# Patient Record
Sex: Female | Born: 1935
Health system: Southern US, Community
[De-identification: ages and names within clinical notes are randomized; demographics above are authoritative.]

## PROBLEM LIST (undated history)

## (undated) DIAGNOSIS — E785 Hyperlipidemia, unspecified: Secondary | ICD-10-CM

## (undated) DIAGNOSIS — M1990 Unspecified osteoarthritis, unspecified site: Secondary | ICD-10-CM

## (undated) DIAGNOSIS — Z9221 Personal history of antineoplastic chemotherapy: Secondary | ICD-10-CM

## (undated) DIAGNOSIS — C50412 Malignant neoplasm of upper-outer quadrant of left female breast: Secondary | ICD-10-CM

## (undated) DIAGNOSIS — Z5189 Encounter for other specified aftercare: Secondary | ICD-10-CM

## (undated) DIAGNOSIS — C50919 Malignant neoplasm of unspecified site of unspecified female breast: Secondary | ICD-10-CM

## (undated) DIAGNOSIS — F419 Anxiety disorder, unspecified: Secondary | ICD-10-CM

## (undated) DIAGNOSIS — I10 Essential (primary) hypertension: Secondary | ICD-10-CM

## (undated) DIAGNOSIS — G709 Myoneural disorder, unspecified: Secondary | ICD-10-CM

## (undated) DIAGNOSIS — H269 Unspecified cataract: Secondary | ICD-10-CM

## (undated) DIAGNOSIS — T8859XA Other complications of anesthesia, initial encounter: Secondary | ICD-10-CM

## (undated) DIAGNOSIS — K219 Gastro-esophageal reflux disease without esophagitis: Secondary | ICD-10-CM

## (undated) DIAGNOSIS — D649 Anemia, unspecified: Secondary | ICD-10-CM

## (undated) DIAGNOSIS — T4145XA Adverse effect of unspecified anesthetic, initial encounter: Secondary | ICD-10-CM

## (undated) HISTORY — DX: Unspecified cataract: H26.9

## (undated) HISTORY — DX: Malignant neoplasm of upper-outer quadrant of left female breast: C50.412

## (undated) HISTORY — DX: Gastro-esophageal reflux disease without esophagitis: K21.9

## (undated) HISTORY — DX: Hyperlipidemia, unspecified: E78.5

## (undated) HISTORY — DX: Anemia, unspecified: D64.9

## (undated) HISTORY — DX: Anxiety disorder, unspecified: F41.9

## (undated) HISTORY — DX: Myoneural disorder, unspecified: G70.9

## (undated) HISTORY — DX: Encounter for other specified aftercare: Z51.89

## (undated) HISTORY — DX: Unspecified osteoarthritis, unspecified site: M19.90

## (undated) HISTORY — DX: Essential (primary) hypertension: I10

---
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
## 1987-03-22 HISTORY — PX: DILATION AND CURETTAGE OF UTERUS: SHX78

## 2000-03-21 HISTORY — PX: COLONOSCOPY: SHX174

## 2001-03-07 ENCOUNTER — Ambulatory Visit (HOSPITAL_COMMUNITY): Admission: RE | Admit: 2001-03-07 | Discharge: 2001-03-07 | Payer: Self-pay | Admitting: Gastroenterology

## 2001-03-07 ENCOUNTER — Encounter (INDEPENDENT_AMBULATORY_CARE_PROVIDER_SITE_OTHER): Payer: Self-pay | Admitting: *Deleted

## 2004-05-31 ENCOUNTER — Ambulatory Visit: Payer: Self-pay | Admitting: Gastroenterology

## 2004-06-14 ENCOUNTER — Ambulatory Visit: Payer: Self-pay | Admitting: Gastroenterology

## 2004-07-09 ENCOUNTER — Ambulatory Visit: Payer: Self-pay | Admitting: Family Medicine

## 2005-03-05 ENCOUNTER — Emergency Department: Payer: Self-pay | Admitting: Emergency Medicine

## 2005-04-14 ENCOUNTER — Ambulatory Visit: Payer: Self-pay | Admitting: Gastroenterology

## 2005-04-27 ENCOUNTER — Encounter (INDEPENDENT_AMBULATORY_CARE_PROVIDER_SITE_OTHER): Payer: Self-pay | Admitting: *Deleted

## 2005-04-27 ENCOUNTER — Ambulatory Visit: Payer: Self-pay | Admitting: Gastroenterology

## 2005-04-27 LAB — HM COLONOSCOPY

## 2005-05-12 ENCOUNTER — Ambulatory Visit (HOSPITAL_COMMUNITY): Admission: RE | Admit: 2005-05-12 | Discharge: 2005-05-12 | Payer: Self-pay | Admitting: Gastroenterology

## 2005-05-12 ENCOUNTER — Ambulatory Visit: Payer: Self-pay | Admitting: Internal Medicine

## 2005-07-12 ENCOUNTER — Ambulatory Visit: Payer: Self-pay | Admitting: Gastroenterology

## 2005-08-29 ENCOUNTER — Ambulatory Visit: Payer: Self-pay | Admitting: Gastroenterology

## 2005-09-01 ENCOUNTER — Ambulatory Visit: Payer: Self-pay | Admitting: Family Medicine

## 2005-09-01 ENCOUNTER — Ambulatory Visit: Payer: Self-pay | Admitting: Gastroenterology

## 2006-03-21 HISTORY — PX: NECK SURGERY: SHX720

## 2006-06-06 ENCOUNTER — Ambulatory Visit: Payer: Self-pay | Admitting: Unknown Physician Specialty

## 2006-11-02 ENCOUNTER — Ambulatory Visit: Payer: Self-pay | Admitting: Family Medicine

## 2007-09-18 ENCOUNTER — Encounter (INDEPENDENT_AMBULATORY_CARE_PROVIDER_SITE_OTHER): Payer: Self-pay | Admitting: Family Medicine

## 2007-09-18 ENCOUNTER — Ambulatory Visit: Payer: Self-pay | Admitting: Sports Medicine

## 2007-09-18 DIAGNOSIS — I10 Essential (primary) hypertension: Secondary | ICD-10-CM | POA: Insufficient documentation

## 2007-09-18 DIAGNOSIS — E785 Hyperlipidemia, unspecified: Secondary | ICD-10-CM

## 2007-09-18 DIAGNOSIS — M722 Plantar fascial fibromatosis: Secondary | ICD-10-CM

## 2007-09-18 DIAGNOSIS — K279 Peptic ulcer, site unspecified, unspecified as acute or chronic, without hemorrhage or perforation: Secondary | ICD-10-CM | POA: Insufficient documentation

## 2007-10-12 ENCOUNTER — Ambulatory Visit: Payer: Self-pay | Admitting: Sports Medicine

## 2007-10-12 DIAGNOSIS — M25579 Pain in unspecified ankle and joints of unspecified foot: Secondary | ICD-10-CM

## 2008-03-12 ENCOUNTER — Ambulatory Visit: Payer: Self-pay | Admitting: Family Medicine

## 2008-05-02 ENCOUNTER — Ambulatory Visit: Payer: Self-pay | Admitting: Family Medicine

## 2008-11-04 ENCOUNTER — Ambulatory Visit: Payer: Self-pay | Admitting: Ophthalmology

## 2008-11-18 ENCOUNTER — Ambulatory Visit: Payer: Self-pay | Admitting: Ophthalmology

## 2009-01-14 ENCOUNTER — Ambulatory Visit: Payer: Self-pay | Admitting: Ophthalmology

## 2009-01-27 ENCOUNTER — Ambulatory Visit: Payer: Self-pay | Admitting: Ophthalmology

## 2009-03-21 HISTORY — PX: CATARACT EXTRACTION, BILATERAL: SHX1313

## 2009-05-19 ENCOUNTER — Ambulatory Visit: Payer: Self-pay | Admitting: Family Medicine

## 2009-10-21 ENCOUNTER — Ambulatory Visit: Payer: Self-pay | Admitting: Family Medicine

## 2010-03-21 HISTORY — PX: JOINT REPLACEMENT: SHX530

## 2010-08-06 NOTE — Op Note (Signed)
Sandra Fritz, Sandra Fritz NO.:  [id number]   MEDICAL RECORD NO.:  [id number]          PATIENT TYPE:  AMB   LOCATION:  ENDO                         FACILITY:  [facility]   PHYSICIAN:  Iva Boop, M.D. LHCDATE OF BIRTH:  Oct 17, 1934   DATE OF PROCEDURE:  05/12/2005  DATE OF DISCHARGE:  05/12/2005                                 OPERATIVE REPORT   PROCEDURE:  Small bowel capsule endoscopy.   REFERRED BY:  Vania Rea. Jarold Motto, M.D.   INDICATIONS FOR PROCEDURE:  A 76 year old woman with iron deficiency anemia  and unrevealing upper GI endoscopy and colonoscopy.   PROCEDURE DATA:  Height 68 inches, weight 168 pounds, normal build. The  gastric passage time 1 hour and 19 minutes, small bowel passage time 3 hours  20 minutes.   PROCEDURE:  Information and findings showed a completed study with the  capsule reaching the colon. There was one small erosion versus a prominent  vascular area in the proximal small bowel. This is a nonspecific finding.   SUMMARY AND RECOMMENDATIONS:  I do not see any cause of iron deficiency  here.   Further plans per Dr. Jarold Motto regarding any further workup and therapy.      Iva Boop, M.D. [facility]  Electronically Signed     CEG/MEDQ  D:  05/23/2005  T:  05/23/2005  Job:  [id number]   cc:   Vania Rea. Jarold Motto, M.D. [facility]  [address]  [place]  [place] [address]

## 2010-11-10 ENCOUNTER — Ambulatory Visit: Payer: Self-pay | Admitting: Neurology

## 2010-12-21 ENCOUNTER — Other Ambulatory Visit (HOSPITAL_COMMUNITY): Payer: Self-pay | Admitting: Neurosurgery

## 2010-12-21 ENCOUNTER — Encounter (HOSPITAL_COMMUNITY)
Admission: RE | Admit: 2010-12-21 | Discharge: 2010-12-21 | Disposition: A | Discharge status: Home / Self Care | Payer: Medicare Other | Source: Ambulatory Visit | Attending: Neurosurgery | Admitting: Neurosurgery

## 2010-12-21 DIAGNOSIS — R52 Pain, unspecified: Secondary | ICD-10-CM

## 2010-12-21 LAB — CBC
HCT: 36.1 % (ref 36.0–46.0)
Hemoglobin: 11.6 g/dL — ABNORMAL LOW (ref 12.0–15.0)
MCH: 26.4 pg (ref 26.0–34.0)
MCHC: 32.1 g/dL (ref 30.0–36.0)
MCV: 82 fL (ref 78.0–100.0)
Platelets: 435 10*3/uL — ABNORMAL HIGH (ref 150–400)
RBC: 4.4 MIL/uL (ref 3.87–5.11)
RDW: 15.6 % — ABNORMAL HIGH (ref 11.5–15.5)
WBC: 7.5 10*3/uL (ref 4.0–10.5)

## 2010-12-21 LAB — BASIC METABOLIC PANEL
BUN: 16 mg/dL (ref 6–23)
Calcium: 10.2 mg/dL (ref 8.4–10.5)
GFR calc Af Amer: 44 mL/min — ABNORMAL LOW (ref >90)
GFR calc non Af Amer: 38 mL/min — ABNORMAL LOW (ref >90)
Potassium: 3.5 mEq/L (ref 3.5–5.1)

## 2011-01-04 ENCOUNTER — Observation Stay (HOSPITAL_COMMUNITY): Payer: Medicare Other

## 2011-01-04 ENCOUNTER — Inpatient Hospital Stay (HOSPITAL_COMMUNITY)
Admission: RE | Admit: 2011-01-04 | Discharge: 2011-01-05 | DRG: 473 | Disposition: A | Discharge status: Home / Self Care | Payer: Medicare Other | Source: Ambulatory Visit | Attending: Neurosurgery | Admitting: Neurosurgery

## 2011-01-04 DIAGNOSIS — I10 Essential (primary) hypertension: Secondary | ICD-10-CM | POA: Diagnosis present

## 2011-01-04 DIAGNOSIS — Z01812 Encounter for preprocedural laboratory examination: Secondary | ICD-10-CM

## 2011-01-04 DIAGNOSIS — K219 Gastro-esophageal reflux disease without esophagitis: Secondary | ICD-10-CM | POA: Diagnosis present

## 2011-01-04 DIAGNOSIS — Z01818 Encounter for other preprocedural examination: Secondary | ICD-10-CM

## 2011-01-04 DIAGNOSIS — M4000 Postural kyphosis, site unspecified: Principal | ICD-10-CM | POA: Diagnosis present

## 2011-01-19 ENCOUNTER — Other Ambulatory Visit: Payer: Self-pay | Admitting: Neurosurgery

## 2011-01-19 ENCOUNTER — Ambulatory Visit
Admission: RE | Admit: 2011-01-19 | Discharge: 2011-01-19 | Disposition: A | Discharge status: Home / Self Care | Payer: Medicare Other | Source: Ambulatory Visit | Attending: Neurosurgery | Admitting: Neurosurgery

## 2011-01-19 DIAGNOSIS — M542 Cervicalgia: Secondary | ICD-10-CM

## 2011-01-19 DIAGNOSIS — M533 Sacrococcygeal disorders, not elsewhere classified: Secondary | ICD-10-CM

## 2011-06-14 ENCOUNTER — Ambulatory Visit: Payer: Self-pay | Admitting: Family Medicine

## 2011-09-28 ENCOUNTER — Ambulatory Visit: Payer: Self-pay | Admitting: Family Medicine

## 2011-12-22 ENCOUNTER — Ambulatory Visit: Payer: Self-pay | Admitting: Family Medicine

## 2012-08-16 ENCOUNTER — Ambulatory Visit: Payer: Self-pay | Admitting: Family Medicine

## 2012-09-28 ENCOUNTER — Ambulatory Visit: Payer: Self-pay | Admitting: Orthopedic Surgery

## 2012-12-25 ENCOUNTER — Ambulatory Visit: Payer: Self-pay | Admitting: Family Medicine

## 2013-06-25 LAB — LIPID PANEL
CHOLESTEROL: 216 mg/dL — AB (ref 0–200)
HDL: 64 mg/dL (ref 35–70)
LDL Cholesterol: 133 mg/dL
TRIGLYCERIDES: 96 mg/dL (ref 40–160)

## 2013-06-25 LAB — TSH: TSH: 0.95 u[IU]/mL (ref 0.41–5.90)

## 2013-06-29 ENCOUNTER — Ambulatory Visit: Payer: Self-pay | Admitting: Orthopedic Surgery

## 2013-08-19 ENCOUNTER — Ambulatory Visit: Payer: Self-pay | Admitting: Orthopedic Surgery

## 2013-10-02 ENCOUNTER — Ambulatory Visit: Payer: Self-pay | Admitting: Orthopedic Surgery

## 2013-10-02 LAB — URINALYSIS, COMPLETE
BLOOD: NEGATIVE
Bacteria: NONE SEEN
Bilirubin,UR: NEGATIVE
GLUCOSE, UR: NEGATIVE mg/dL (ref 0–75)
KETONE: NEGATIVE
Leukocyte Esterase: NEGATIVE
Nitrite: NEGATIVE
Ph: 6 (ref 4.5–8.0)
Protein: NEGATIVE
Specific Gravity: 1.016 (ref 1.003–1.030)
Squamous Epithelial: 3

## 2013-10-02 LAB — PROTIME-INR
INR: 1
Prothrombin Time: 12.9 secs (ref 11.5–14.7)

## 2013-10-02 LAB — BASIC METABOLIC PANEL
ANION GAP: 7 (ref 7–16)
BUN: 22 mg/dL — AB (ref 7–18)
CO2: 30 mmol/L (ref 21–32)
Calcium, Total: 9.8 mg/dL (ref 8.5–10.1)
Chloride: 98 mmol/L (ref 98–107)
Creatinine: 1.05 mg/dL (ref 0.60–1.30)
EGFR (Non-African Amer.): 52 — ABNORMAL LOW
GFR CALC AF AMER: 60 — AB
GLUCOSE: 126 mg/dL — AB (ref 65–99)
OSMOLALITY: 275 (ref 275–301)
Potassium: 3.3 mmol/L — ABNORMAL LOW (ref 3.5–5.1)
Sodium: 135 mmol/L — ABNORMAL LOW (ref 136–145)

## 2013-10-02 LAB — CBC
HCT: 37.1 % (ref 35.0–47.0)
HGB: 12.3 g/dL (ref 12.0–16.0)
MCH: 28.5 pg (ref 26.0–34.0)
MCHC: 33.3 g/dL (ref 32.0–36.0)
MCV: 86 fL (ref 80–100)
PLATELETS: 461 10*3/uL — AB (ref 150–440)
RBC: 4.33 10*6/uL (ref 3.80–5.20)
RDW: 13.9 % (ref 11.5–14.5)
WBC: 8.7 10*3/uL (ref 3.6–11.0)

## 2013-10-02 LAB — APTT: Activated PTT: 26.6 secs (ref 23.6–35.9)

## 2013-10-03 LAB — MRSA PCR SCREENING

## 2013-10-09 ENCOUNTER — Inpatient Hospital Stay: Payer: Self-pay | Admitting: Specialist

## 2013-10-09 LAB — CBC WITH DIFFERENTIAL/PLATELET
BASOS PCT: 0.6 %
Basophil #: 0.1 10*3/uL (ref 0.0–0.1)
EOS ABS: 0.1 10*3/uL (ref 0.0–0.7)
EOS PCT: 0.9 %
HCT: 30.7 % — AB (ref 35.0–47.0)
HGB: 9.8 g/dL — ABNORMAL LOW (ref 12.0–16.0)
LYMPHS ABS: 2.9 10*3/uL (ref 1.0–3.6)
LYMPHS PCT: 24.9 %
MCH: 28 pg (ref 26.0–34.0)
MCHC: 32.1 g/dL (ref 32.0–36.0)
MCV: 88 fL (ref 80–100)
MONO ABS: 0.8 x10 3/mm (ref 0.2–0.9)
Monocyte %: 6.3 %
NEUTROS PCT: 67.3 %
Neutrophil #: 8 10*3/uL — ABNORMAL HIGH (ref 1.4–6.5)
Platelet: 386 10*3/uL (ref 150–440)
RBC: 3.51 10*6/uL — AB (ref 3.80–5.20)
RDW: 13.6 % (ref 11.5–14.5)
WBC: 11.8 10*3/uL — ABNORMAL HIGH (ref 3.6–11.0)

## 2013-10-09 LAB — POTASSIUM: POTASSIUM: 3.1 mmol/L — AB (ref 3.5–5.1)

## 2013-10-10 LAB — BASIC METABOLIC PANEL
ANION GAP: 8 (ref 7–16)
BUN: 14 mg/dL (ref 7–18)
CHLORIDE: 97 mmol/L — AB (ref 98–107)
CO2: 27 mmol/L (ref 21–32)
Calcium, Total: 8.7 mg/dL (ref 8.5–10.1)
Creatinine: 0.93 mg/dL (ref 0.60–1.30)
EGFR (African American): >60
GFR CALC NON AF AMER: 60 — AB
Glucose: 136 mg/dL — ABNORMAL HIGH (ref 65–99)
Osmolality: 267 (ref 275–301)
Potassium: 4.2 mmol/L (ref 3.5–5.1)
SODIUM: 132 mmol/L — AB (ref 136–145)

## 2013-10-10 LAB — CBC WITH DIFFERENTIAL/PLATELET
BASOS ABS: 0 10*3/uL (ref 0.0–0.1)
Basophil %: 0.1 %
EOS ABS: 0 10*3/uL (ref 0.0–0.7)
EOS PCT: 0 %
HCT: 28.8 % — AB (ref 35.0–47.0)
HGB: 9.5 g/dL — ABNORMAL LOW (ref 12.0–16.0)
LYMPHS ABS: 1.2 10*3/uL (ref 1.0–3.6)
LYMPHS PCT: 11.7 %
MCH: 28.4 pg (ref 26.0–34.0)
MCHC: 33 g/dL (ref 32.0–36.0)
MCV: 86 fL (ref 80–100)
MONOS PCT: 7.3 %
Monocyte #: 0.8 x10 3/mm (ref 0.2–0.9)
NEUTROS ABS: 8.6 10*3/uL — AB (ref 1.4–6.5)
NEUTROS PCT: 80.9 %
Platelet: 365 10*3/uL (ref 150–440)
RBC: 3.34 10*6/uL — AB (ref 3.80–5.20)
RDW: 13.5 % (ref 11.5–14.5)
WBC: 10.6 10*3/uL (ref 3.6–11.0)

## 2013-10-11 LAB — HEPATIC FUNCTION PANEL A (ARMC)
ALK PHOS: 85 U/L
ALT: 33 U/L
Albumin: 2.4 g/dL — ABNORMAL LOW (ref 3.4–5.0)
BILIRUBIN DIRECT: 0.2 mg/dL (ref 0.00–0.20)
Bilirubin,Total: 0.6 mg/dL (ref 0.2–1.0)
SGOT(AST): 43 U/L — ABNORMAL HIGH (ref 15–37)
TOTAL PROTEIN: 6.4 g/dL (ref 6.4–8.2)

## 2013-10-11 LAB — AMMONIA

## 2013-10-11 LAB — FERRITIN: FERRITIN (ARMC): 80 ng/mL (ref 8–388)

## 2013-10-11 LAB — HEMOGLOBIN: HGB: 8.4 g/dL — AB (ref 12.0–16.0)

## 2013-10-14 LAB — PATHOLOGY REPORT

## 2013-10-15 ENCOUNTER — Encounter: Payer: Self-pay | Admitting: Internal Medicine

## 2013-10-16 ENCOUNTER — Observation Stay: Payer: Self-pay | Admitting: Internal Medicine

## 2013-10-16 LAB — BASIC METABOLIC PANEL
ANION GAP: 8 (ref 7–16)
BUN: 16 mg/dL (ref 7–18)
CO2: 28 mmol/L (ref 21–32)
Calcium, Total: 9 mg/dL (ref 8.5–10.1)
Chloride: 99 mmol/L (ref 98–107)
Creatinine: 1.21 mg/dL (ref 0.60–1.30)
EGFR (African American): 50 — ABNORMAL LOW
EGFR (Non-African Amer.): 43 — ABNORMAL LOW
GLUCOSE: 95 mg/dL (ref 65–99)
Osmolality: 271 (ref 275–301)
Potassium: 3.9 mmol/L (ref 3.5–5.1)
SODIUM: 135 mmol/L — AB (ref 136–145)

## 2013-10-16 LAB — CK TOTAL AND CKMB (NOT AT ARMC)
CK, TOTAL: 110 U/L
CK, TOTAL: 117 U/L
CK, Total: 73 U/L
CK-MB: 0.7 ng/mL (ref 0.5–3.6)
CK-MB: 0.7 ng/mL (ref 0.5–3.6)
CK-MB: 0.9 ng/mL (ref 0.5–3.6)

## 2013-10-16 LAB — IRON AND TIBC
IRON: 40 ug/dL — AB (ref 50–170)
Iron Bind.Cap.(Total): 258 ug/dL (ref 250–450)
Iron Saturation: 16 %
Unbound Iron-Bind.Cap.: 218 ug/dL

## 2013-10-16 LAB — CBC
HCT: 24.8 % — AB (ref 35.0–47.0)
HGB: 8 g/dL — ABNORMAL LOW (ref 12.0–16.0)
MCH: 28.1 pg (ref 26.0–34.0)
MCHC: 32.3 g/dL (ref 32.0–36.0)
MCV: 87 fL (ref 80–100)
Platelet: 535 10*3/uL — ABNORMAL HIGH (ref 150–440)
RBC: 2.84 10*6/uL — AB (ref 3.80–5.20)
RDW: 13.7 % (ref 11.5–14.5)
WBC: 9.3 10*3/uL (ref 3.6–11.0)

## 2013-10-16 LAB — FOLATE: Folic Acid: 42.1 ng/mL (ref 3.1–100.0)

## 2013-10-16 LAB — TROPONIN I: Troponin-I: <0.02 ng/mL

## 2013-10-16 LAB — RETICULOCYTES
ABSOLUTE RETIC COUNT: 0.0637 10*6/uL (ref 0.019–0.186)
Reticulocyte: 2.42 % (ref 0.4–3.1)

## 2013-10-16 LAB — FERRITIN: FERRITIN (ARMC): 80 ng/mL (ref 8–388)

## 2013-10-17 LAB — BASIC METABOLIC PANEL
Anion Gap: 5 — ABNORMAL LOW (ref 7–16)
BUN: 15 mg/dL (ref 7–18)
CHLORIDE: 101 mmol/L (ref 98–107)
CREATININE: 1.15 mg/dL (ref 0.60–1.30)
Calcium, Total: 8.9 mg/dL (ref 8.5–10.1)
Co2: 31 mmol/L (ref 21–32)
EGFR (African American): 54 — ABNORMAL LOW
GFR CALC NON AF AMER: 46 — AB
Glucose: 101 mg/dL — ABNORMAL HIGH (ref 65–99)
Osmolality: 275 (ref 275–301)
Potassium: 3.9 mmol/L (ref 3.5–5.1)
SODIUM: 137 mmol/L (ref 136–145)

## 2013-10-17 LAB — CBC WITH DIFFERENTIAL/PLATELET
BASOS ABS: 0.1 10*3/uL (ref 0.0–0.1)
BASOS PCT: 0.7 %
EOS ABS: 0.3 10*3/uL (ref 0.0–0.7)
EOS PCT: 4.3 %
HCT: 24.4 % — ABNORMAL LOW (ref 35.0–47.0)
HGB: 7.9 g/dL — AB (ref 12.0–16.0)
LYMPHS ABS: 2 10*3/uL (ref 1.0–3.6)
LYMPHS PCT: 26.6 %
MCH: 28.1 pg (ref 26.0–34.0)
MCHC: 32.4 g/dL (ref 32.0–36.0)
MCV: 87 fL (ref 80–100)
MONO ABS: 0.7 x10 3/mm (ref 0.2–0.9)
MONOS PCT: 8.8 %
Neutrophil #: 4.4 10*3/uL (ref 1.4–6.5)
Neutrophil %: 59.6 %
PLATELETS: 573 10*3/uL — AB (ref 150–440)
RBC: 2.81 10*6/uL — ABNORMAL LOW (ref 3.80–5.20)
RDW: 13.5 % (ref 11.5–14.5)
WBC: 7.4 10*3/uL (ref 3.6–11.0)

## 2013-10-18 LAB — HEMOGLOBIN: HGB: 8.1 g/dL — ABNORMAL LOW (ref 12.0–16.0)

## 2013-10-19 ENCOUNTER — Encounter: Payer: Self-pay | Admitting: Internal Medicine

## 2013-10-19 LAB — URINALYSIS, COMPLETE
Bacteria: NONE SEEN
Bilirubin,UR: NEGATIVE
GLUCOSE, UR: NEGATIVE mg/dL (ref 0–75)
KETONE: NEGATIVE
Nitrite: NEGATIVE
PROTEIN: NEGATIVE
Ph: 7 (ref 4.5–8.0)
Specific Gravity: 1.005 (ref 1.003–1.030)
Squamous Epithelial: 1
WBC UR: 550 /HPF (ref 0–5)

## 2013-10-20 LAB — URINE CULTURE

## 2013-10-22 LAB — BASIC METABOLIC PANEL
Anion Gap: 8 (ref 7–16)
BUN: 19 mg/dL — AB (ref 7–18)
CREATININE: 2.02 mg/dL — AB (ref 0.60–1.30)
Calcium, Total: 9.1 mg/dL (ref 8.5–10.1)
Chloride: 90 mmol/L — ABNORMAL LOW (ref 98–107)
Co2: 32 mmol/L (ref 21–32)
EGFR (Non-African Amer.): 23 — ABNORMAL LOW
GFR CALC AF AMER: 27 — AB
Glucose: 85 mg/dL (ref 65–99)
Osmolality: 262 (ref 275–301)
Potassium: 2.8 mmol/L — ABNORMAL LOW (ref 3.5–5.1)
Sodium: 130 mmol/L — ABNORMAL LOW (ref 136–145)

## 2013-10-22 LAB — CBC WITH DIFFERENTIAL/PLATELET
BASOS PCT: 0.9 %
Basophil #: 0.1 10*3/uL (ref 0.0–0.1)
EOS PCT: 2.6 %
Eosinophil #: 0.2 10*3/uL (ref 0.0–0.7)
HCT: 25.9 % — AB (ref 35.0–47.0)
HGB: 8.4 g/dL — AB (ref 12.0–16.0)
Lymphocyte #: 1.4 10*3/uL (ref 1.0–3.6)
Lymphocyte %: 23.9 %
MCH: 28.1 pg (ref 26.0–34.0)
MCHC: 32.6 g/dL (ref 32.0–36.0)
MCV: 86 fL (ref 80–100)
MONO ABS: 0.5 x10 3/mm (ref 0.2–0.9)
MONOS PCT: 9.2 %
NEUTROS PCT: 63.4 %
Neutrophil #: 3.8 10*3/uL (ref 1.4–6.5)
PLATELETS: 664 10*3/uL — AB (ref 150–440)
RBC: 3 10*6/uL — AB (ref 3.80–5.20)
RDW: 13.7 % (ref 11.5–14.5)
WBC: 5.9 10*3/uL (ref 3.6–11.0)

## 2013-10-22 LAB — MAGNESIUM: MAGNESIUM: 1.9 mg/dL

## 2013-10-29 LAB — CBC WITH DIFFERENTIAL/PLATELET
BASOS PCT: 2.1 %
Basophil #: 0.1 10*3/uL (ref 0.0–0.1)
Eosinophil #: 0.2 10*3/uL (ref 0.0–0.7)
Eosinophil %: 5 %
HCT: 28.2 % — AB (ref 35.0–47.0)
HGB: 9.3 g/dL — AB (ref 12.0–16.0)
LYMPHS ABS: 2.3 10*3/uL (ref 1.0–3.6)
Lymphocyte %: 51 %
MCH: 28.4 pg (ref 26.0–34.0)
MCHC: 32.8 g/dL (ref 32.0–36.0)
MCV: 87 fL (ref 80–100)
MONOS PCT: 6.2 %
Monocyte #: 0.3 x10 3/mm (ref 0.2–0.9)
Neutrophil #: 1.6 10*3/uL (ref 1.4–6.5)
Neutrophil %: 35.7 %
Platelet: 706 10*3/uL — ABNORMAL HIGH (ref 150–440)
RBC: 3.26 10*6/uL — ABNORMAL LOW (ref 3.80–5.20)
RDW: 13.7 % (ref 11.5–14.5)
WBC: 4.5 10*3/uL (ref 3.6–11.0)

## 2013-10-29 LAB — BASIC METABOLIC PANEL
ANION GAP: 5 — AB (ref 7–16)
BUN: 14 mg/dL (ref 7–18)
CO2: 32 mmol/L (ref 21–32)
Calcium, Total: 10 mg/dL (ref 8.5–10.1)
Chloride: 99 mmol/L (ref 98–107)
Creatinine: 1.41 mg/dL — ABNORMAL HIGH (ref 0.60–1.30)
GFR CALC AF AMER: 42 — AB
GFR CALC NON AF AMER: 36 — AB
GLUCOSE: 97 mg/dL (ref 65–99)
OSMOLALITY: 272 (ref 275–301)
POTASSIUM: 3.7 mmol/L (ref 3.5–5.1)
Sodium: 136 mmol/L (ref 136–145)

## 2014-02-05 LAB — BASIC METABOLIC PANEL
BUN: 21 mg/dL (ref 4–21)
Creatinine: 1.2 mg/dL — AB (ref 0.5–1.1)
GLUCOSE: 108 mg/dL
Potassium: 3.7 mmol/L (ref 3.4–5.3)
SODIUM: 133 mmol/L — AB (ref 137–147)

## 2014-02-05 LAB — CBC AND DIFFERENTIAL
HEMATOCRIT: 35 % — AB (ref 36–46)
Hemoglobin: 11.5 g/dL — AB (ref 12.0–16.0)
NEUTROS ABS: 3 /uL
Platelets: 453 10*3/uL — AB (ref 150–399)
WBC: 6 10^3/mL

## 2014-02-05 LAB — HEPATIC FUNCTION PANEL
ALT: 16 U/L (ref 7–35)
AST: 16 U/L (ref 13–35)
Alkaline Phosphatase: 87 U/L (ref 25–125)
Bilirubin, Total: 0.3 mg/dL

## 2014-06-24 ENCOUNTER — Ambulatory Visit
Admit: 2014-06-24 | Disposition: A | Discharge status: Home / Self Care | Payer: Self-pay | Attending: Family Medicine | Admitting: Family Medicine

## 2014-07-08 ENCOUNTER — Ambulatory Visit
Admit: 2014-07-08 | Disposition: A | Discharge status: Home / Self Care | Payer: Self-pay | Attending: Family Medicine | Admitting: Family Medicine

## 2014-07-12 NOTE — Op Note (Signed)
PATIENT NAME:  Sandra Fritz, Sandra Fritz MR#:  [id number] DATE OF BIRTH:  [birth year]  DATE OF PROCEDURE:  10/09/2013  PREOPERATIVE DIAGNOSIS: Right hip osteoarthritis.   POSTOPERATIVE DIAGNOSIS:  Right hip osteoarthritis.  PROCEDURE: Right total hip arthroplasty.   SURGEON: Timoteo Gaul, M.D.   ASSISTANT:  Christophe Louis, M.D.  ANESTHESIA: Spinal.   SPECIMENS: Femoral head to pathology.   ESTIMATED BLOOD LOSS: 600 mL.   COMPLICATIONS: None.   IMPLANTS: Stryker Accolade 2 127 degree, size 4 stem with 50 mm Stryker Trident acetabular, a cluster hole component with a -5 36 mm head and neutral polyethylene liner.   INDICATIONS FOR PROCEDURE: The patient is a 80 year old female who has developed severe right hip pain. She has failed all nonoperative management for her hip osteoarthritis, including nonsteroidal anti-inflammatories and intraarticular corticosteroid injection. Her symptoms have acutely worsened over the past two weeks, to the point that she was having difficulty walking. She wished to proceed with right total hip arthroplasty. Her x-rays have demonstrated bone-on-bone contact between the femoral head and acetabulum with the subchondral sclerosis, subchondral cyst formation and lateral osteophytes. I had explained the details of the procedure with the patient in my office prior to the date of surgery. I also reviewed the risks and benefits of surgery. She understands the risks, include infection, requiring removal of the prosthesis,  nerve or blood vessel injury, including injury to the sciatic nerve leading to foot drop, bleeding requiring blood transfusion, fracture, dislocation, leg length discrepancy, change in lower extremity rotation, persistent right hip pain, hardware failure and the need for revision hip surgery. Medical risks include, but are not limited to, DVT and pulmonary embolism, myocardial infarction, stroke, pneumonia, respiratory failure and death. The patient  understood these risks and wished to proceed.   PROCEDURE NOTE: The patient was met in the preoperative area. I signed the right hip, according to the hospital's right site protocol. I updated her history and physical. The patient was then brought to the Operating Room where she underwent a spinal anesthetic. She was placed in a left lateral decubitus position. All bony prominences were adequately padded. This included an axillary roll under her left side. The left lower extremity was adequately padded to avoid compression on the common peroneal nerve during the case. The patient was then prepped and draped in a sterile fashion. A timeout was performed to verify the patient's name, date of birth, medical record number, correct site of surgery and correct procedure to be performed. It was also used to verify the patient had received antibiotics and that all appropriate instruments, implants, and radiographic studies were available in the room. Once, all in attendance, were in agreement; the case began.   A curvilinear incision was made over the posterolateral hip. The subcutaneous tissues were dissected using electrocautery. All bleeding vessels were cauterized during the exposure. The fascia lata was then identified. The overlying adipose tissue was elevated using a Acupuncturist. A deep #10 blade was used to, again, make a curvilinear incision in the fascia lata. The gluteus maximus muscle was then split in line with its fibers. The external rotators were then removed from their attachment to the posterior aspect of the greater trochanter and reflected posteriorly to protect the sciatic nerve. A Charnley retractor was used for exposure. A #2 Tycron was used to tag the piriformis tendon for later repair. The hip capsule was then easily identified. A T-shaped capsulotomy was performed. The leaflets of the capsule were then tagged with #2 Basilio Cairo,  again for later repair. The hip was then carefully dislocated. A  proximal femoral osteotomy was performed, approximately 1 cm above the lesser trochanter. The femoral head was then measured on the back table and sent to pathology. There was significant deformity of the femoral head. The labrum was then removed from the acetabulum. The hip joint was then copiously irrigated. Two straight Hohmann retractors were placed both anterior and posterior of the acetabulum; this allowed excellent visualization of the acetabulum. The pulvinar was removed using electrocautery; this allowed for visualization of the acetabular teardrop. Sequential acetabular reamers were then inserted into the acetabulum. The first reamer was a size 44 and this was used to medialize the acetabulum to the medial wall. Then, sequential reamers were used to debride the remaining cartilage from the acetabulum. The acetabulum was reamed to a size 15 mm. A 50 mm trial was then inserted and found to have excellent fit. The actual Trident PSL cup with cluster holes was then inserted and malleted in position; it had excellent stability. The inserting device was then removed. A Frazier sucker tip was then used to ensure that the PSL cup had bottomed out. A single acetabular screw was placed into the superior dome of the acetabulum for added stability to the acetabular shell. A neutral trial liner was then screwed into position. The attention was then turned to preparation of the femur. A femoral neck hip skid was placed underneath the femur along with a Surveyor, minerals. A box osteotome was used to then used to make the initial entry into the proximal femur. A single hand reamer was then inserted down the femoral shaft. A femoral canal sounder was then used to confirm that no penetration of the cortex of the femoral shaft had occurred during hand reaming. The sequential broaches were then used, starting with a size 0. These went up by single sizes until the best medial and lateral fit was achieved with a size 4 femoral  stem. A 0 and -5 head with a 132 and 127 degree neck angles were trialed. The best stability was achieved with a 127 degree trial neck and - 5 head. This allowed for equivalent leg lengths and excellent stability with full range of motion. A portable AP pelvis was taken in the Operating Room with the trial components in place. This film was then reviewed to assess component positioning, compartment sizing and leg lengths. All trial instruments were then removed and the hip joint was copiously irrigated. The trial acetabular liner was removed. A dome hole eliminator was then inserted into the acetabular component. A neutral polyethylene liner was then malleted into the metallic acetabular shell. The joint was, again, copiously irrigated. The Stryker Accolade 2 127 degree neck angle size 4 stem was then gently malleted into position. Again, the 36 mm -5 head was then placed on the trunnion and the hip was re-reduced. Again, it had equivalent leg lengths with full range of motion and excellent stability. The trial head was then removed. The actual 36 mm -5 metallic head was then malleted into position on the trunnion. The hip was then reduced for the final time. The hip joint was copiously irrigated. The posterior capsule was closed with #2 Tycron and then attached to the greater trochanter via drill holes. The piriformis was repaired to the abductor tendon in a soft tissue repair. Again, the hip joint was copiously irrigated with pulse lavage. The fascia lata was closed with interrupted 0 Vicryl, the subcutaneous tissues were closed in  several layers with 0 Vicryl and 2-0 Vicryl. The skin was approximated with staples. A dry sterile dressing was applied. The patient was then placed supine on the operative table at the conclusion of the case. This allowed for clinical evaluation of leg lengths which were found to be equivalent. The patient had an abduction pillow placed between her legs and then she was transferred to  a hospital bed and brought to the PACU in stable condition.   I spoke with the patient's family in the postoperative consultation room to let them know the case had gone without complication and the patient was stable in the recovery room. All sharp and instrument counts were correct at the conclusion of the case. I was scrubbed and present for the entire case.    ____________________________ Timoteo Gaul, MD klk:aj D: 10/09/2013 23:28:21 ET T: 10/10/2013 03:24:31 ET JOB#: [id number]  cc: Timoteo Gaul, MD, <Dictator> Timoteo Gaul MD ELECTRONICALLY SIGNED 10/23/2013 11:04

## 2014-07-12 NOTE — H&P (Signed)
PATIENT NAME:  Sandra Fritz, Sandra Fritz MR#:  [id number] DATE OF BIRTH:  1934/04/23  DATE OF ADMISSION:  10/16/2013  DATE OF ADMISSION:  10/16/2013   ADMITTING PHYSICIAN: Gladstone Lighter, MD  PRIMARY CARE PHYSICIAN: Richard L. Rosanna Randy, MD  CHIEF COMPLAINT: Chest pain.   HISTORY OF PRESENT ILLNESS: Sandra Fritz is a very pleasant 80 year old Caucasian female with past medical history significant for hypertension, gastritis, osteoarthritis and recent admission last week for right hip arthroplasty done for osteoarthritis by Dr. Mack Guise, sent to [facility] rehab 2 days ago, comes back with chest pain. The patient was placed on Lovenox 30 mg b.i.d. subcutaneously for DVT prophylaxis after her hip surgery and she is also taking low-dose aspirin every day. She has not had any prior history of melanotic stools. She noticed since she went to [facility] her stools were getting more dark but she has not had any other symptoms like lightheadedness, dizziness or hypotension. She woke up this morning with like a knot feeling in the mid sternal area.  It was nonradiating, not associated with nausea, diaphoresis or any other symptoms. It stayed there until this morning for a few hours and right now she is symptom-free. Her hemoglobin is 8 whereas it was 8.4 two days ago at the time of discharge. Also, she had recent surgery done. She had a prior colonoscopy which showed polyps according to the patient. Known history of gastritis and on Nexium daily. Currently, she is asymptomatic and stable otherwise.   PAST MEDICAL HISTORY: 1.  Hypertension.  2.  Osteoarthritis.  3.  Degenerative disk disease.  4.  Spinal stenosis.  5.  Gastritis and gastroesophageal reflux disease.   PAST SURGICAL HISTORY:  1.  Appendectomy. 2.  Tonsillectomy.  3.  Dental implants.  4.  Cataract surgery.  5.  Cervical fusion surgery.  6.  Recent right hip surgery.   ALLERGIES TO MEDICATIONS:  ALEVE, CEPHALOSPORINS, AZITHROMYCIN AND OTHER  MYCINS.   CURRENT HOME MEDICATIONS:  1.  Aspirin 81 mg p.o. daily.  2.  Lovenox 30 mg subcutaneous b.i.d.  3.  Allegra 180 mg p.o. daily.  4.  Tylenol 650 mg q.4 hours p.r.n. for pain or fever.  5.  Calcium/vitamin D 600 mg/8 international units p.o. b.i.d.  6.  Clobetasol 0.05% topical cream twice a day.  7.  Ferrous sulfate 325 mg p.o. b.i.d.  8.  Gabapentin 400 mg p.o. 3 times a day.  9.  Hydrochlorothiazide/losartan 25/100 mg p.o. daily.  10.  Hydrocortisone 2.2% topical cream once a day.  11.  Klor-Con 10 mEq daily. 12.  Lasix 20 mg p.o. daily.  13.  Loratadine 10 mg p.o. daily.  14.  Magnesium hydroxide 30 mL p.o. b.i.d. for constipation. 15.  Nexium 40 mg p.o. daily.  16.  Oxycodone 5 mg 1 to 2 tablets q.4 hours p.r.n. for pain.   SOCIAL HISTORY: Was living at home prior to her surgery, now at [facility] for the last 2 days and using a walker per physical therapy. No smoking, no alcohol use.   FAMILY HISTORY: Significant for heart disease in late 45s in father.   REVIEW OF SYSTEMS:    CONSTITUTIONAL: No fever, fatigue or weakness.  EYES: No blurring of vision, double vision, inflammation or glaucoma.  ENT: No tinnitus, ear pain, hearing loss, epistaxis or discharge.  RESPIRATORY: No cough, wheeze, hemoptysis or chronic obstructive pulmonary disease.  CARDIOVASCULAR: Positive for chest pain.  No orthopnea, edema, arrhythmia, palpitations or syncope.  GASTROINTESTINAL: No nausea, vomiting, diarrhea, abdominal  pain, hematemesis. Positive for melena.  ENDOCRINE:  No polyuria, nocturia, thyroid problems, heat or cold intolerance.  HEMATOLOGY: No anemia, easy bruising or bleeding.  SKIN: No acne, rash or lesions.  MUSCULOSKELETAL: No neck, back shoulder pain. Positive for arthritis. No gout.  NEUROLOGIC: No numbness, weakness, cerebrovascular accident, transient ischemic attack or seizures.  PSYCHIATRIC:  No anxiety, insomnia or depression.   PHYSICAL EXAMINATION: VITAL SIGNS:  Temperature 98.6 degrees Fahrenheit, pulse 97, respirations 20, blood pressure 158/60, pulse oximetry 98% on room air.  GENERAL: Well-built, well-nourished female lying in bed, not in any acute distress.  HEENT: Normocephalic, atraumatic. Pupils equal, round, reacting to light. Anicteric sclerae. Extraocular movements intact. Oropharynx clear without erythema, mass, or exudate. NECK:  Supple.  No cardiomegaly, JVD or carotid bruits.  No lymphadenopathy.  LUNGS: Moving air bilaterally. No wheeze or crackles. No use of accessory muscles for breathing.  CARDIOVASCULAR: S1, S2, regular rate and rhythm. No murmurs, rubs or gallops.  ABDOMEN: Soft, nontender, nondistended. No hepatosplenomegaly.  Normal bowel sounds.  EXTREMITIES: Right hip still has dressing in place with a TENS pain pump in place. No pedal edema. No clubbing or cyanosis; 2+ dorsalis pedis pulses palpable bilaterally.  SKIN: No acne, rash or lesions.  LYMPHATICS: No cervical or inguinal lymphadenopathy.  NEUROLOGIC: Cranial nerves intact. No focal motor or sensory deficits.  PSYCHOLOGIC: The patient is awake, alert, oriented x 3.   LABORATORY, DIAGNOSTIC AND RADIOLOGICAL DATA:   1.  WBC 9.3, hemoglobin 8.0, hematocrit 24.8, platelet count 535.  2.  Sodium 137, potassium 3.9, chloride 99, bicarbonate 28, BUN 16, creatinine 1.21, glucose 95 and calcium of 9.0. Troponin less than 0.02.  3.  Chest x-ray showing clear lung fields. No acute cardiopulmonary disease. Evidence of some chronic obstructive pulmonary disease changes seen.  4.  EKG showing normal sinus rhythm, right bundle branch block, heart rate of 96. No acute ST-T wave abnormality noted.   ASSESSMENT AND PLAN: A 80 year old female with arthritis, hypertension, no cardiac history, just sent to rehab after right hip arthroplasty done for osteoarthritis about a week ago, comes back with chest pain and melena.  1.  Chest pain. Could be atypical chest pain. Angina from anemia is a  possibility or could be gastroesophageal reflux disease. We will admit to telemetry. Recycle cardiac enzymes, first set of troponin is negative. Due to her melena and anemia, hold off on aspirin for now. Get an echocardiogram to rule out wall motion abnormalities. No other anticoagulation due to her melena.  Again, she is currently symptom-free.  2.  Melena with anemia, acute on chronic anemia, likely from gastritis. On Lovenox and also aspirin at the rehab. Hold off aspirin for now. Continue the Lovenox as she needs to the deep venous thrombosis prophylaxis.  No indication for transfusion and gastroenterology has been consulted. Protonix increased to b.i.d. and iron studies have been ordered. Recent surgery so postoperative anemia is also possible.   3.  Hypertension. Continue home medications.  4.  Recent right hip surgery 1 week ago.  Orthopedic followup as outpatient. Lovenox for deep vein thrombosis prophylaxis. The patient is from [facility] and physical therapy evaluation has been ordered.  5.  CODE STATUS: Full code.   TIME SPENT ON ADMISSION: 50 minutes.   ____________________________ Gladstone Lighter, MD rk:cs D: 10/16/2013 16:22:00 ET T: 10/16/2013 18:07:23 ET JOB#: [id number]  cc: Gladstone Lighter, MD, <Dictator> Timoteo Gaul, MD Richard L. Rosanna Randy, MD  Gladstone Lighter MD ELECTRONICALLY SIGNED 11/05/2013 13:45

## 2014-07-12 NOTE — Discharge Summary (Signed)
PATIENT NAME:  Sandra Fritz, Sandra Fritz MR#:  [id number] DATE OF BIRTH:  April 10, 1934  DATE OF ADMISSION:  10/09/2013 DATE OF DISCHARGE:  10/14/2013  FINAL DIAGNOSES:  1.  Severe osteoarthritis right hip.  2.  Hypertension.  3.  Gastroesophageal reflux disease.  4.  Seasonal allergies.  5.  Positive methicillin-resistant Staphylococcus aureus screen.   OPERATIONS:  October 09, 2013 right total hip replacement by Dr. Mack Guise.   COMPLICATIONS: None.  CONSULTATIONS:  None.  DISCHARGE MEDICATIONS: Home medications as prior to admission.  Oxycodone p.r.n. pain, iron 325 mg daily, Lovenox 30 mg subcutaneously twice a day.   HISTORY OF PRESENT ILLNESS: The patient is a 80 year old female with advanced osteoarthritis of the right hip. She was followed conservatively with nonoperative management including intraarticular hip injection without relief of pain. X-ray showed bone-on-bone contact subchondral sclerosis, and cysts and marginal osteophytes. She presented for right total hip replacement by Dr. Mack Guise.   PAST MEDICAL HISTORY: Illnesses as above.   MEDICATIONS:  1.  Nexium.  2.  Loratadine.   3.  K-Lor.  4.  Hydrochlorothiazide.  5.  Calcium.  6.  Vitamins.  7.  Lasix.  8.  Hydrocortisone cream.  9.  Aspirin,  10. Allegra.   ALLERGIES: NEOSPORIN, SULFA, ALEVE, CEPHALOSPORINS, ERYTHROMYCIN.   FAMILY HISTORY: Unremarkable.   SOCIAL HISTORY: Unremarkable.   REVIEW OF SYSTEMS: Unremarkable.   PHYSICAL EXAMINATION:  GENERAL: The patient is alert and cooperative. Vital signs were normal. The right hip showed decreased motion with internal rotation to 5 degrees, external rotation 45 degrees.  Flexion was 110 degrees.  Pedal pulses were palpable. Skin was intact. Ligaments were almost equal.   LABORATORY DATA: On admission was satisfactory.   HOSPITAL COURSE: On 10/09/2013 the patient underwent right total hip replacement. Postoperatively she did well. She had no significant medical  problems. Hemoglobin dropped to 8.4 on the 2nd  postoperative day. She had no constitutional symptoms. She was gradually ambulated. She is ready for skilled nursing discharge 10/14/2013. She is to be partial weight-bearing on the right leg.  She will be seen by Dr. Mack Guise in his office in 2 weeks. She will take Lovenox for prevention of blood clots.    ____________________________ Park Breed, MD hem:lt D: 10/14/2013 14:47:55 ET T: 10/14/2013 15:17:35 ET JOB#: [id number]  cc: Park Breed, MD, <Dictator> Richard L. Rosanna Randy, [place] MD ELECTRONICALLY SIGNED 10/16/2013 13:02

## 2014-07-12 NOTE — Consult Note (Signed)
Referring Physician:  Thornton Park :   Primary Care Physician:  Thornton Park : [facility], [address] [place], [place], [place] [address], [place] [phone]  Reason for Consult: Admit Date: 08-Oct-2013  Chief Complaint: R hip pain  Reason for Consult: abnormal movements   History of Present Illness: History of Present Illness:   80 yo RHD F presents to [facility] for an elective total R hip replacement.  POD#2, pt developed some unusual movements in R arm that consisted of mainly quick one second jerks.  There was some involvement of the L arm as well.  Pt states that she had some weakness and numbness on the R also prior to surgery and notes that she had mild L sided weakness as well.  The L sided symptoms resolved but she noted the weakness still on the R.  She does have an occasional neck pain.  She feels a little more somnolent after taking some narcotics.  ROS:  General fatigue   HEENT no complaints   Lungs no complaints   Cardiac no complaints   GI no complaints   GU no complaints   Musculoskeletal neck and hip ain   Extremities no complaints   Skin no complaints   Neuro numbness/tingling   Endocrine no complaints   Psych no complaints   Past Medical/Surgical Hx:  Multi-drug Resistant Organism (MDRO): Positive MRSA Screen result.  Neck Surgery:   Past Medical/ Surgical Hx:  Past Medical History HTN, SAR, CAD   Past Surgical History cervical fusion, R hip   Home Medications: Medication Instructions Last Modified Date/Time  enoxaparin 30 milligram(s) subcutaneous 2 times a day 24-Jul-15 11:37  acetaminophen 325 mg oral tablet 2 tab(s) orally every 4 hours, As needed, -for temperature >100.5 24-Jul-15 11:37  oxyCODONE 5 mg oral tablet 1 -2 tab(s) orally every 4- 6  hours, As needed, pain 24-Jul-15 11:38  magnesium hydroxide 8% oral suspension 30 milliliter(s) orally 2 times a day, As needed, constipation 24-Jul-15 11:37   ferrous sulfate 325 mg (65 mg elemental iron) oral tablet 1 tab(s) orally 2 times a day (with meals) 24-Jul-15 11:37  Lasix 20 mg oral tablet 1 tab(s) orally once a day 23-Jul-15 14:17  NexIUM 40 mg oral delayed release capsule 1 cap(s) orally once a day 23-Jul-15 14:17  multivitamin 1   once a day 22-Jul-15 12:07  aspirin 81 mg oral tablet 1 tab(s) orally once a day at 1730, 23-Jul-15 14:17  hydrochlorothiazide-losartan 25 mg-100 mg oral tablet 1 tab(s) orally once a day at 1730. 23-Jul-15 14:17  Klor-Con 10 10 mEq oral tablet, extended release 1 tab(s) orally 2 times a day 23-Jul-15 14:17  loratadine 10 mg oral tablet 1 tab(s) orally once a day 23-Jul-15 14:17  Calcium 600+D 600 mg-800 intl units oral tablet 1 tab(s) orally 2 times a day 23-Jul-15 14:17  Allegra 180 mg oral tablet 1 tab(s) orally once a day (at bedtime) 23-Jul-15 14:17  Hydrocort cream 2.5% topical cream Apply topically to affected area once a day 23-Jul-15 14:17  clobetasol topical 0.05% topical cream Apply topically to affected area 2 times a day 23-Jul-15 14:17   Allergies:  Other- Explain in Comments Line: Rash, Swelling  Aleve: Chest Pain  Erythromycin: Swelling, Rash  Cephalosporins: Rash, Itching  Allergies:  Allergies cephalosporins   Social/Family History: Employment Status: retired  Lives With: significant other  Living Arrangements: apartment  Social History: no tob, no EtOH, no illicits  Family History: no strokes, no seizures  Vital Signs: **Vital Signs.:   24-Jul-15 06:13  Vital Signs Type Q 4hr  Temperature Temperature (F) 98.6  Celsius 37  Temperature Source oral  Pulse Pulse 109  Respirations Respirations 18  Systolic BP Systolic BP 921  Diastolic BP (mmHg) Diastolic BP (mmHg) 67  Mean BP 89  Pulse Ox % Pulse Ox % 94  Pulse Ox Activity Level  At rest  Oxygen Delivery Room Air/ 21 %   Physical Exam: General: normal weight, no acute distress  HEENT: normocephalic, sclera nonicteric,  oropharynx clear  Neck: supple, no JVD, no bruits  Chest: CTA B, no wheezing, good movement  Cardiac: RRR, no murmurs, 2+ pulses  Extremities: both LE limited ROM with SCDs on and R leg immobilized   Neurologic Exam: Mental Status: sleepy but oriented x 3, nl speech and langauge  Cranial Nerves: PERRLA, EOMI, nl VF, face symmetric, tongue midline, shoulder shrug equal  Motor Exam: 4/5 R UE, 5 /5 L UE, nl tone, mild myoclonus noted, no tremor,  Deep Tendon Reflexes: 2+/4 B, plantars downgoing B, no Hoffman  Sensory Exam: decreased temp on the R face and arm as well as pinprick, nl vibration  Coordination: F to N normal on L, limited due to weakness on R   Lab Results:  Routine BB:  15-Jul-15 13:39   Crossmatch Unit 1 Cancelled  Crossmatch Unit 2 Cancelled  Routine Chem:  15-Jul-15 13:39   Result Comment XMATCH - PRODUCTS NOT USED FOR SURGERY  Result(s) reported on 10 Oct 2013 at 03:02PM.  23-Jul-15 05:59   Glucose, Serum  136  BUN 14  Creatinine (comp) 0.93  Sodium, Serum  132  Potassium, Serum 4.2  Chloride, Serum  97  CO2, Serum 27  Calcium (Total), Serum 8.7  Anion Gap 8  Osmolality (calc) 267  eGFR (African American) >60  eGFR (Non-African American)  60 (eGFR values <30m/min/1.73 m2 may be an indication of chronic kidney disease (CKD). Calculated eGFR is useful in patients with stable renal function. The eGFR calculation will not be reliable in acutely ill patients when serum creatinine is changing rapidly. It is not useful in  patients on dialysis. The eGFR calculation may not be applicable to patients at the low and high extremes of body sizes, pregnant women, and vegetarians.)  Routine Hem:  23-Jul-15 05:59   WBC (CBC) 10.6  RBC (CBC)  3.34  Hematocrit (CBC)  28.8  Platelet Count (CBC) 365  MCV 86  MCH 28.4  MCHC 33.0  RDW 13.5  Neutrophil % 80.9  Lymphocyte % 11.7  Monocyte % 7.3  Eosinophil % 0.0  Basophil % 0.1  Neutrophil #  8.6  Lymphocyte # 1.2   Monocyte # 0.8  Eosinophil # 0.0  Basophil # 0.0 (Result(s) reported on 10 Oct 2013 at 06:38AM.)  24-Jul-15 06:25   Hemoglobin (CBC)  8.4 (Result(s) reported on 11 Oct 2013 at 06:55AM.)   Impression/Recommendations: Recommendations:   prior labs reviewed by me notes reviewed by me   Myoclonus-  improved per patient, this is likely secondary and not primary.  Check LFTs and ABG as these are most likely cause as renal function is good.   R sided weakness-  this could be from spinal stenosis but there is more concern for a stroke with facial sensory changes as well.  Not sure if this is acute or long standing either.  Spinal or stroke are both high on differential. MRI of brain and c-spine w/o contrast check ammonia, LFTs and ABG  increase Neurotin to 451m TID will follow  Electronic Signatures for Addendum Section:  SJamison Neighbor(MD) (Signed Addendum 24-Jul-15 18:05)  labs normal, MRI of brain negative for stroke, MRI of c-spine shows significant spinal stenosis which could account for sensory and strength issues on the R side.  Pt can be sent to rehab tomorrow.  [facility]   Recommend spine consult as outpatient to follow cervical disease.   Electronic Signatures: SJamison Neighbor(MD)  (Signed 24-Jul-15 13:36)  Authored: REFERRING PHYSICIAN, Primary Care Physician, Consult, History of Present Illness, Review of Systems, PAST MEDICAL/SURGICAL HISTORY, HOME MEDICATIONS, ALLERGIES, Social/Family History, NURSING VITAL SIGNS, Physical Exam-, LAB RESULTS, Recommendations   Last Updated: 24-Jul-15 18:05 by SJamison Neighbor(MD)

## 2014-07-12 NOTE — Consult Note (Signed)
PATIENT NAME:  Sandra Fritz, Sandra Fritz MR#:  [id number] DATE OF BIRTH:  1934-08-27  DATE OF CONSULTATION:  10/16/2013  REFERRING PHYSICIAN:   CONSULTING PHYSICIAN:  Manya Silvas, MD  HISTORY OF PRESENT ILLNESS: The patient is a 80 year old white female who was admitted recently for right hip arthroplasty and went to [facility]. She was put on Lovenox 30 mg subcutaneous b.i.d. and low-dose aspirin. She began having black stools about 3 days ago. She had been on iron but noticed that the stools were significantly darker.    She also had pain in the epigastric area and did not have nausea or diaphoresis. Pain lasted a few hours and then went away. Her hemoglobin was found to be 8 and she was admitted to the hospital for a possible GI bleeding. I was asked to see her in consultation.   The patient has a history of an upper and lower endoscopy, she thinks. She does say her memory is not working as well as it used to.   The patient admits to taking Aleve for her arthritis and quit this up to 2 weeks before the hip surgery.   PAST MEDICAL HISTORY:  1. Hypertension.  2. Osteoarthritis. 3  Spinal stenosis with surgery.  4. Gastritis and GERD.   PAST SURGICAL HISTORY: Appendectomy, tonsillectomy, dental implants, cataract surgery, cervical fusion surgery, recent hip surgery.   ALLERGIES: CEPHALOSPORINS AND AZITHROMYCIN.  CURRENT HOME MEDICATIONS: Aspirin 81 mg a day, Lovenox 30 mg subcutaneous b.i.d., Allegra 180 mg a day, Tylenol 650 mg every 4 hours p.r.n. for pain or fever, ferrous sulfate 325 mg b.i.d., Gabapentin 400 mg 3 times a day, HCTZ/losartan 25/100 one a day, hydrocortisone topical ointment daily, Klor-Con 10 mEq daily, Lasix 20 mg a day, loratadine 10 mg a day, magnesium hydroxide 30 mL b.i.d., Nexium 40 mg a day, oxycodone 5 mg 1 or 2 q. 4 hours p.r.n. for pain.   HABITS: Does not smoke. Does not drink.   FAMILY HISTORY: Positive for heart disease in her father.  REVIEW  OF SYSTEMS: She denies any dysphagia. No vomiting. No abdominal pain. She did have dark stools become black over the last few days. No dysuria or hematuria. No asthma, wheezing or emphysema. She did have some epigastric, substernal pain that went away.   PHYSICAL EXAMINATION:  GENERAL: Elderly white female in no acute distress. Occasionally having trouble with recalling medications or details. VITAL SIGNS: Temperature 97.5, pulse 90, respirations 20, blood pressure 146/76. White female in no acute distress. HEENT: Sclerae nonicteric. Conjunctivae are slightly pale. Tongue slightly pale. Head is atraumatic.  CHEST: Clear.  HEART: No murmurs or gallops I can hear.  ABDOMEN: Soft, nontender. No hepatosplenomegaly. Right hip has a dressing and a TENS unit in place.   LABORATORY DATA: White count 9.3, hemoglobin 8, platelet count 535. Sodium 137, potassium 3.9, chloride 99, bicarbonate 28, BUN 16, creatinine 1.2. Chest x-ray showed clear lung fields. EKG shows a right bundle branch block.   ASSESSMENT: Melena with anemia, probably from gastritis with effects of aspirin and Lovenox, although no indication to be transfused at the moment, I would keep very close tabs on her hemoglobin. If she drops below 7, I would transfuse her. Her atypical chest pain could have been cardiac in nature and could also have been biliary in nature and she could have passed a small stone.   At this time,  I will hold off on upper endoscopy and recommended treatment with a PPI and Carafate slurry.  Consideration for keeping Lovenox at lowest dose possible while her gastritis has a chance to heal up. I would, of course, avoid all nonsteroidal anti-inflammatory drugs.    ____________________________ Manya Silvas, MD rte:jh D: 10/16/2013 19:06:00 ET T: 10/16/2013 20:56:23 ET JOB#: [id number]  cc: Delfino Lovett L. Rosanna Randy, MD Timoteo Gaul, MD Manya Silvas, MD, <Dictator>   Manya Silvas MD ELECTRONICALLY SIGNED  10/28/2013 18:00

## 2014-07-12 NOTE — Consult Note (Signed)
Pt hemoglobin steady at 7.9.  Stools dark, may be component of oral iron.  Feels better today.  No new suggestions.  Check daily hgb  Electronic Signatures: Manya Silvas (MD)  (Signed on 30-Jul-15 17:08)  Authored  Last Updated: 30-Jul-15 17:08 by Manya Silvas (MD)

## 2014-07-12 NOTE — Discharge Summary (Signed)
PATIENT NAME:  Sandra Fritz, Sandra Fritz MR#:  [id number] DATE OF BIRTH:  January 28, 1935  DATE OF ADMISSION:  10/16/2013 DATE OF DISCHARGE:  10/18/2013  PRESENTING COMPLAINT: Chest pain.   DISCHARGE DIAGNOSES: 1.  Chest pain, appears atypical, likely due to gastritis, resolved.  2.  Acute on chronic anemia secondary to gastritis in the setting of recent starting on Lovenox and on aspirin, improved. The patient's hemoglobin is 8.1.  3.  Chronic anemia.  4.  Hypertension.  5.  Recent right hip surgery. The patient will continue Lovenox for 10 more days.   CONDITION ON DISCHARGE: Fair.   CODE STATUS: FULL.  DIAGNOSTIC DATA: Hemoglobin at discharge is 8.1.   DISCHARGE VITALS: Stable.  DISCHARGE DIET: Soft diet.   DISCHARGE ACTIVITY: Continue with physical therapy.  DISCHARGE INSTRUCTIONS: Follow up with Dr. Vira Agar in 2 to 4 weeks.   DISCHARGE MEDICATIONS:  1.  Tylenol 650 q. 4 p.r.n.  2.  Gabapentin 400 mg t.i.d.  3.  Calcium with vitamin D 1 tablet b.i.d.  4.  Hydrochlorothiazide/losartan 12.5/50 two tablets daily.  5.  Protonix 40 mg b.i.d.  6.  Ferrous sulfate 325 mg p.o. b.i.d.  7.  Lasix 20 mg daily.  8.  Loratadine 10 mg daily.  9.  Oxycodone 5 mg q. 3 to 4 p.r.n.  10.  Milk of mag 30 mL b.i.d. p.r.n.  11.  Carafate 1 gram q.i.d. before meals. 12.  Hydrocortisone 2.5% apply to affected area daily.  13.  Temovate apply to affected area b.i.d.; this is 0.05% cream.  14.  Lovenox 30 mg subcu b.i.d.  BRIEF SUMMARY OF HOSPITAL COURSE: Ms. Nobile is a 80 year old Caucasian female who recently had right hip surgery, went to [facility] skilled facility with Lovenox for DVT prophylaxis, has history of hypertension, comes in with chest pain and melena. She was admitted with: 1.  Chest pain, which is likely due to gastritis. Remained chest pain-free and was ruled out with 3 sets of negative cardiac enzymes. She had an echo. The patient's aspirin was discontinued. She is symptom free now.  2.   Melena with anemia, acute on chronic, likely from gastritis due to combination of Lovenox and aspirin. The patient was also taking some NSAIDs prior to her hip surgery as well. Her hemoglobin remained stable and at discharge was 8.1. She was started on Protonix b.i.d. along with Carafate. Dr. Vira Agar recommended continue iron pills. She will follow up with Dr. Vira Agar as outpatient. The patient was advised to avoid NSAIDs and aspirin at this time.  3.  Hypertension. Continued home meds. 4.  Right hip surgery. The patient's physical therapy was continued. She will finish up Lovenox for 10 days of treatment as part of DVT prophylaxis.   DISPOSITION: The patient will return back to [facility] today. She remained a FULL code.  TIME SPENT: 40 minutes.  ____________________________ Hart Rochester Posey Pronto, MD sap:sb D: 10/18/2013 09:50:41 ET T: 10/18/2013 12:16:05 ET JOB#: [id number]  cc: Shawntez Dickison A. Posey Pronto, MD, <Dictator> Ilda Basset MD ELECTRONICALLY SIGNED 10/30/2013 11:38

## 2014-07-12 NOTE — Discharge Summary (Signed)
PATIENT NAME:  Sandra Fritz, Sandra Fritz MR#:  [id number] DATE OF BIRTH:  October 16, 1934  DATE OF ADMISSION:  10/11/2013 DATE OF DISCHARGE:  10/12/2013  ADMITTING DIAGNOSIS: Right total hip arthroplasty.   HOSPITAL COURSE: The patient was admitted following an uncomplicated right total hip arthroplasty on 10/09/2013. She was admitted to the orthopedic surgery floor. She had pain initially postoperative on morphine and was changed to Dilaudid which controlled her pain much better. She was on 24 hours of postoperative antibiotics. The patient was started on Lovenox for DVT prophylaxis. She began physical and occupational therapy on postoperative day #1.  The night of surgery, the patient developed involuntary movements of her right forearm, wrist, and fingers. I consulted the neurology service for evaluation. Dr. Valora Corporal from neurology saw the patient on postoperative day #2. The patient was ordered for an MRI of her C-spine and brain. The brain MRI was negative for stroke. The MRI of the C-spine showed significant spinal stenosis which he felt could account for her sensory and strength issues on the right side. He states that the patient could be discharge to rehabilitation by postoperative day #3 and will follow up with the [facility] neurology in 3-4 weeks. He also recommended a spine consult as an outpatient to follow up with cervical disease. Her laboratory studies that were ordered were normal. He increased her Neurontin to 400 mg t.i.d.   The patient's Foley catheter was removed on postoperative day #2.  She was improving with physical therapy daily. Her dressing was changed also on postoperative day #2, and the incision was clean, dry, and intact. The patient remained neurovascularly intact throughout her hospitalization. She had intact motor and sensory function in the bilateral lower extremities. She had an abduction pillow placed between her legs while in bed throughout her hospitalization.   The  patient is doing well clinically. It is anticipated she will be discharged on postoperative day #3.   DISCHARGE INSTRUCTIONS: The patient will be discharged to [facility] rehabilitation with instructions to continue partial weight-bearing on the right lower extremity while using her walker. She will elevate the right lower extremity when possible and continue with the ice pack over the right hip. She will use the abduction pillow while in bed. She will continue on Lovenox 30 mg b.i.d. for DVT prophylaxis for one month postoperative. She will elevate the heels off the bed to prevent heel ulceration. She should also continue incentive spirometry. The patient will remain on posterior hip precautions.   DISCHARGE MEDICATIONS: The patient will be discharged on a daily multivitamin, aspirin 81 mg daily, hydrochlorothiazide/losartan combination tablet 25 mg/100 mg 1 tablet daily, Nexium 40 mg daily, Lasix 20 mg daily, Klor-Con 10 mEq oral tablet 1 time b.i.d., loratadine  10 mg daily, calcium 600 mg with 800 international units of vitamin D 1 tablet daily, Allegra 180 mg daily at bedtime. Hydrocort cream 2.5% topical cream applied to affected area daily, Clobetasol topical 0.05% topical cream applied to affected area b.i.d. acetaminophen 325 mg 2 tablets q. 4 hours p.r.n. for pain or temperature greater than 100.5, oxycodone 5 mg tablets 1-2 tabs every 4-6 hours p.r.n. for pain, magnesium hydroxide 8% oral suspension 30 mL b.i.d. as needed for constipation, ferrous sulfate 325 mg b.i.d. with meals, enoxaparin 30 mg subcutaneous b.i.d. x 4 weeks, and gabapentin 400 mg t.i.d.   DISCHARGE DIAGNOSES: 1.  Status post right total hip arthroplasty, uncomplicated.  2.  Secondary myoclonus. 3.  Right-sided weakness, possibly secondary to spinal stenosis.  The patient will follow up with me in my office in 2 weeks for a wound check and staple removal.    ____________________________ Timoteo Gaul,  MD klk:ts D: 10/11/2013 18:24:11 ET T: 10/11/2013 19:25:36 ET JOB#: [id number]  cc: Timoteo Gaul, MD, <Dictator> Timoteo Gaul MD ELECTRONICALLY SIGNED 10/23/2013 11:04

## 2014-07-16 ENCOUNTER — Encounter: Payer: Self-pay | Admitting: Emergency Medicine

## 2014-07-17 ENCOUNTER — Ambulatory Visit
Admit: 2014-07-17 | Disposition: A | Discharge status: Home / Self Care | Payer: Self-pay | Attending: Family Medicine | Admitting: Family Medicine

## 2014-07-25 ENCOUNTER — Encounter: Payer: Self-pay | Admitting: Emergency Medicine

## 2014-07-25 DIAGNOSIS — M1990 Unspecified osteoarthritis, unspecified site: Secondary | ICD-10-CM | POA: Insufficient documentation

## 2014-07-25 DIAGNOSIS — I10 Essential (primary) hypertension: Secondary | ICD-10-CM | POA: Insufficient documentation

## 2014-07-25 DIAGNOSIS — K219 Gastro-esophageal reflux disease without esophagitis: Secondary | ICD-10-CM | POA: Insufficient documentation

## 2014-07-25 DIAGNOSIS — F419 Anxiety disorder, unspecified: Secondary | ICD-10-CM | POA: Insufficient documentation

## 2014-07-25 DIAGNOSIS — R519 Headache, unspecified: Secondary | ICD-10-CM | POA: Insufficient documentation

## 2014-07-25 DIAGNOSIS — R51 Headache: Secondary | ICD-10-CM

## 2014-07-25 DIAGNOSIS — H269 Unspecified cataract: Secondary | ICD-10-CM | POA: Insufficient documentation

## 2014-07-25 DIAGNOSIS — K449 Diaphragmatic hernia without obstruction or gangrene: Secondary | ICD-10-CM | POA: Insufficient documentation

## 2014-07-25 DIAGNOSIS — L03019 Cellulitis of unspecified finger: Secondary | ICD-10-CM | POA: Insufficient documentation

## 2014-07-25 DIAGNOSIS — E785 Hyperlipidemia, unspecified: Secondary | ICD-10-CM | POA: Insufficient documentation

## 2014-07-25 DIAGNOSIS — M25559 Pain in unspecified hip: Secondary | ICD-10-CM | POA: Insufficient documentation

## 2014-07-25 DIAGNOSIS — R600 Localized edema: Secondary | ICD-10-CM | POA: Insufficient documentation

## 2014-07-25 DIAGNOSIS — G629 Polyneuropathy, unspecified: Secondary | ICD-10-CM | POA: Insufficient documentation

## 2014-07-25 DIAGNOSIS — R0602 Shortness of breath: Secondary | ICD-10-CM | POA: Insufficient documentation

## 2014-07-25 DIAGNOSIS — L509 Urticaria, unspecified: Secondary | ICD-10-CM | POA: Insufficient documentation

## 2014-07-25 DIAGNOSIS — M5000 Cervical disc disorder with myelopathy, unspecified cervical region: Secondary | ICD-10-CM | POA: Insufficient documentation

## 2014-07-25 DIAGNOSIS — T7840XA Allergy, unspecified, initial encounter: Secondary | ICD-10-CM | POA: Insufficient documentation

## 2014-07-25 DIAGNOSIS — R079 Chest pain, unspecified: Secondary | ICD-10-CM | POA: Insufficient documentation

## 2014-07-25 DIAGNOSIS — D649 Anemia, unspecified: Secondary | ICD-10-CM | POA: Insufficient documentation

## 2014-07-25 DIAGNOSIS — E871 Hypo-osmolality and hyponatremia: Secondary | ICD-10-CM | POA: Insufficient documentation

## 2014-07-25 DIAGNOSIS — A281 Cat-scratch disease: Secondary | ICD-10-CM | POA: Insufficient documentation

## 2014-07-25 DIAGNOSIS — R42 Dizziness and giddiness: Secondary | ICD-10-CM | POA: Insufficient documentation

## 2014-07-25 DIAGNOSIS — K279 Peptic ulcer, site unspecified, unspecified as acute or chronic, without hemorrhage or perforation: Secondary | ICD-10-CM | POA: Insufficient documentation

## 2014-07-25 DIAGNOSIS — R5383 Other fatigue: Secondary | ICD-10-CM | POA: Insufficient documentation

## 2014-07-25 DIAGNOSIS — J45909 Unspecified asthma, uncomplicated: Secondary | ICD-10-CM | POA: Insufficient documentation

## 2014-08-21 ENCOUNTER — Ambulatory Visit (INDEPENDENT_AMBULATORY_CARE_PROVIDER_SITE_OTHER): Payer: Commercial Managed Care - HMO | Admitting: Family Medicine

## 2014-08-21 ENCOUNTER — Encounter: Payer: Self-pay | Admitting: Family Medicine

## 2014-08-21 VITALS — BP 142/56 | HR 92 | Temp 98.5°F | Resp 18 | Ht 69.0 in | Wt 171.0 lb

## 2014-08-21 DIAGNOSIS — R5383 Other fatigue: Secondary | ICD-10-CM

## 2014-08-21 DIAGNOSIS — E785 Hyperlipidemia, unspecified: Secondary | ICD-10-CM | POA: Diagnosis not present

## 2014-08-21 DIAGNOSIS — K219 Gastro-esophageal reflux disease without esophagitis: Secondary | ICD-10-CM

## 2014-08-21 DIAGNOSIS — R350 Frequency of micturition: Secondary | ICD-10-CM

## 2014-08-21 DIAGNOSIS — J302 Other seasonal allergic rhinitis: Secondary | ICD-10-CM

## 2014-08-21 LAB — POCT URINALYSIS DIPSTICK
Bilirubin, UA: NEGATIVE
Blood, UA: NEGATIVE
GLUCOSE UA: NEGATIVE
KETONES UA: NEGATIVE
LEUKOCYTES UA: NEGATIVE
Nitrite, UA: NEGATIVE
PH UA: 6
Protein, UA: NEGATIVE
Spec Grav, UA: 1.015
Urobilinogen, UA: NEGATIVE

## 2014-08-21 NOTE — Progress Notes (Signed)
Subjective:    Patient ID: Sandra Fritz, female    DOB: [birth year], 80 y.o.   MRN: [id number]  Urinary Tract Infection  This is a new problem. The current episode started more than 1 month ago. The problem occurs every urination. The problem has been waxing and waning (Patient uses Cranberry juice and gets some relief). The quality of the pain is described as burning (Patient describes her symptoms as mostly feeling pressure). The pain is mild. There has been no fever. She is not sexually active. Associated symptoms include frequency and nausea (after being outside for a period of time.). Pertinent negatives include no chills, discharge, flank pain, hematuria, hesitancy, possible pregnancy, sweats, urgency or vomiting. Associated symptoms comments: Patient feels sh eis having some urine retention..  Allergic Reaction This is a chronic problem. Episode onset: Symptoms began with the season change. The problem occurs daily. The problem is unchanged. The problem is moderate. Associated with: Mostly from exposure to poolen. Associated symptoms include coughing, itching, a rash (being treated by dermatology for what they believe to be stress related), trouble swallowing and wheezing. Pertinent negatives include no abdominal pain, chest pain, diarrhea, difficulty breathing, eye itching, eye redness, eye watering or vomiting. There is no swelling present. Past treatments include diphenhydramine (Claritin). The treatment provided moderate (Patient is just concerned if she should be taking both of the medications.) relief. Her past medical history is significant for medication allergies and seasonal allergies. There is no history of asthma.  Gastrophageal Reflux She complains of coughing, heartburn, nausea (after being outside for a period of time.) and wheezing. She reports no abdominal pain, no belching, no chest pain, no choking, no dysphagia, no hoarse voice or no sore throat. This is a recurrent problem.  The current episode started more than 1 year ago. Episode frequency: While on Nexium her symptons are controlled but she is concerned about the long term use. The problem has been gradually improving. Associated symptoms include fatigue. Patient has had history of anemia and GI bleed .    Subjective:     Sandra Fritz is a 80 y.o. female who presents for evaluation of fatigue. Symptoms began several months ago. The patient feels the fatigue began after having her hip replacement. Symptoms of her fatigue have been tiring easily by about 2-3 in the afternoon. Patient describes the following psychological symptoms: anxiety, and increased stress with worry over health issues. Patient denies depression or GI bleeding.  She has however not been on her iron suuplements for a couple of months.. Symptoms have not gotten worse but she feels they are not improving any. Symptom severity: struggles to carry out day to day responsibilities.. Previous visits for this problem: she has had problems with fatigue in the past and was found to have anemia along with GI bleed.    Review of Systems     Objective:    BP 142/56 mmHg  Pulse 92  Temp(Src) 98.5 F (36.9 C) (Oral)  Resp 18  Ht 5\' 9"  (1.753 m)  Wt 171 lb (77.565 kg)  BMI 25.24 kg/m2  General Appearance:    Alert, cooperative, no distress, appears stated age  Head:    Normocephalic, without obvious abnormality, atraumatic  Eyes:    PERRL, conjunctiva/corneas clear, EOM's intact, fundi    benign, both eyes  Ears:    Normal TM's and external ear canals, both ears  Nose:   Nares normal, septum midline, mucosa normal, no drainage    or sinus  tenderness  Throat:   Lips, mucosa, and tongue normal; teeth and gums normal  Neck:   Supple, symmetrical, trachea midline, no adenopathy;    thyroid:  no enlargement/tenderness/nodules; no carotid   bruit or JVD  Back:     Symmetric, no curvature, ROM normal, no CVA tenderness  Lungs:     Clear to auscultation  bilaterally, respirations unlabored  Chest Wall:    No tenderness or deformity   Heart:    Regular rate and rhythm, S1 and S2 normal, no murmur, rub   or gallop     Abdomen:     Soft, non-tender, bowel sounds active all four quadrants,    no masses, no organomegaly        Extremities:   Extremities normal, atraumatic, no cyanosis or edema  Pulses:   2+ and symmetric all extremities  Skin:   Skin color, texture, turgor normal, no rashes or lesions  Lymph nodes:   Cervical, supraclavicular, and axillary nodes normal  Neurologic:   CNII-XII intact, normal strength, sensation and reflexes    throughout      Assessment:    Anxiety is likely contributing to the problem.   GERD stable Urticaria stable  Osteoarthritis. Patient is moving slowly but has not fallen. Overall doing fairly well. More than 25 minutes spent in total with patient. More than 50% spent in counseling. Plan:    Discussed diagnosis with patient. Reassured that serious underlying cause for the fatigue is very unlikely.     Review of Systems  Constitutional: Positive for fatigue. Negative for chills.  HENT: Positive for congestion, postnasal drip, rhinorrhea, sinus pressure, sneezing and trouble swallowing. Negative for hoarse voice and sore throat.   Eyes: Negative.  Negative for redness and itching.  Respiratory: Positive for cough, shortness of breath and wheezing. Negative for choking.   Cardiovascular: Negative for chest pain, palpitations and leg swelling.  Gastrointestinal: Positive for heartburn and nausea (after being outside for a period of time.). Negative for dysphagia, vomiting, abdominal pain and diarrhea.  Genitourinary: Positive for frequency, decreased urine volume and difficulty urinating. Negative for hesitancy, urgency, hematuria, flank pain, vaginal bleeding and vaginal discharge.  Skin: Positive for itching and rash (being treated by dermatology for what they believe to be stress related).        Objective:   Physical Exam  Constitutional: She is oriented to person, place, and time. She appears well-developed and well-nourished.  HENT:  Head: Normocephalic and atraumatic.  Eyes: Conjunctivae and EOM are normal. Pupils are equal, round, and reactive to light.  Neck: Normal range of motion. Neck supple.  Cardiovascular: Normal rate and regular rhythm.   Pulmonary/Chest: Effort normal and breath sounds normal.  Abdominal: Soft. Bowel sounds are normal.  Neurological: She is alert and oriented to person, place, and time. She has normal reflexes.  Skin: Skin is warm and dry.  Psychiatric: She has a normal mood and affect. Her behavior is normal. Judgment and thought content normal.          Assessment & Plan:  Urine culture is obtained. She feels much better after the visit and feels reassured. Will let us know if she feels worse.

## 2014-08-23 LAB — CBC WITH DIFFERENTIAL/PLATELET
BASOS: 0 %
Basophils Absolute: 0 10*3/uL (ref 0.0–0.2)
EOS (ABSOLUTE): 0.2 10*3/uL (ref 0.0–0.4)
Eos: 3 %
Hematocrit: 32.5 % — ABNORMAL LOW (ref 34.0–46.6)
Hemoglobin: 10.7 g/dL — ABNORMAL LOW (ref 11.1–15.9)
IMMATURE GRANS (ABS): 0 10*3/uL (ref 0.0–0.1)
Immature Granulocytes: 0 %
LYMPHS: 30 %
Lymphocytes Absolute: 1.9 10*3/uL (ref 0.7–3.1)
MCH: 27.7 pg (ref 26.6–33.0)
MCHC: 32.9 g/dL (ref 31.5–35.7)
MCV: 84 fL (ref 79–97)
Monocytes Absolute: 0.4 10*3/uL (ref 0.1–0.9)
Monocytes: 6 %
Neutrophils Absolute: 3.8 10*3/uL (ref 1.4–7.0)
Neutrophils: 61 %
Platelets: 363 10*3/uL (ref 150–379)
RBC: 3.86 x10E6/uL (ref 3.77–5.28)
RDW: 14.6 % (ref 12.3–15.4)
WBC: 6.3 10*3/uL (ref 3.4–10.8)

## 2014-08-23 LAB — TSH: TSH: 0.773 u[IU]/mL (ref 0.450–4.500)

## 2014-08-23 LAB — COMPREHENSIVE METABOLIC PANEL
ALK PHOS: 58 IU/L (ref 39–117)
ALT: 18 IU/L (ref 0–32)
AST: 19 IU/L (ref 0–40)
Albumin/Globulin Ratio: 1.8 (ref 1.1–2.5)
Albumin: 4.2 g/dL (ref 3.5–4.8)
BUN/Creatinine Ratio: 19 (ref 11–26)
BUN: 20 mg/dL (ref 8–27)
Bilirubin Total: 0.4 mg/dL (ref 0.0–1.2)
CALCIUM: 9.7 mg/dL (ref 8.7–10.3)
CO2: 27 mmol/L (ref 18–29)
Chloride: 95 mmol/L — ABNORMAL LOW (ref 97–108)
Creatinine, Ser: 1.08 mg/dL — ABNORMAL HIGH (ref 0.57–1.00)
GFR, EST AFRICAN AMERICAN: 57 mL/min/{1.73_m2} — AB (ref >59)
GFR, EST NON AFRICAN AMERICAN: 50 mL/min/{1.73_m2} — AB (ref >59)
GLOBULIN, TOTAL: 2.4 g/dL (ref 1.5–4.5)
GLUCOSE: 101 mg/dL — AB (ref 65–99)
POTASSIUM: 3.7 mmol/L (ref 3.5–5.2)
Sodium: 135 mmol/L (ref 134–144)
TOTAL PROTEIN: 6.6 g/dL (ref 6.0–8.5)

## 2014-08-23 LAB — HDL CHOLESTEROL: HDL: 71 mg/dL (ref >39)

## 2014-08-24 LAB — CULTURE, URINE COMPREHENSIVE

## 2014-08-26 ENCOUNTER — Telehealth: Payer: Self-pay

## 2014-08-26 MED ORDER — CIPROFLOXACIN HCL 250 MG PO TABS: 250.0000 mg | ORAL_TABLET | Freq: Two times a day (BID) | ORAL | Status: DC

## 2014-08-26 NOTE — Telephone Encounter (Signed)
Patient advised, RX sent in.

## 2014-08-26 NOTE — Telephone Encounter (Signed)
-----   Message from Jerrol Banana., MD sent at 08/26/2014  7:16 AM EDT ----- UTI present--try Cipro 250mg  BID for 5 day if not taking an antibiotic. thanks

## 2014-08-29 ENCOUNTER — Encounter: Payer: Self-pay | Admitting: Family Medicine

## 2014-12-23 ENCOUNTER — Encounter: Payer: Self-pay | Admitting: Family Medicine

## 2014-12-23 ENCOUNTER — Ambulatory Visit (INDEPENDENT_AMBULATORY_CARE_PROVIDER_SITE_OTHER): Payer: Commercial Managed Care - HMO | Admitting: Family Medicine

## 2014-12-23 VITALS — BP 138/52 | HR 80 | Temp 97.9°F | Resp 12 | Wt 172.0 lb

## 2014-12-23 DIAGNOSIS — Z23 Encounter for immunization: Secondary | ICD-10-CM

## 2014-12-23 DIAGNOSIS — K219 Gastro-esophageal reflux disease without esophagitis: Secondary | ICD-10-CM

## 2014-12-23 DIAGNOSIS — R5383 Other fatigue: Secondary | ICD-10-CM

## 2014-12-23 DIAGNOSIS — E041 Nontoxic single thyroid nodule: Secondary | ICD-10-CM

## 2014-12-23 NOTE — Progress Notes (Signed)
Patient ID: Sandra Fritz, female   DOB: 12/23/1934, 79 y.o.   MRN: [id number]    Subjective:  HPI  Fatigue follow up: Patient is here for 3 months follow up. On her last visit several things were discussed-it was noted that this was unlikely anything serious that is contributing to this. Routine labs were done and they were stable- Hemoglobin was at 10.7 and Hematocrit was at 32.5 these were lower than 4 months ago. She does take Iron tablet OTC 45 mg some days not every day. She feels about the same as last time. Urine culture was positive for infection after her last visit and she was treated with Cipro.  She wanted to discuss a few things: GERD-she has been taking Nexium for 14 days then switching to Ranitidine OTC 150 mg in the morning and 75 mg in the evening. She can not go without taking Nexium because in about 2 weeks she feels heartburn and discomfort, Nexium RX is not covered under insurance.  She has life screening tests done-she was worried about eGFR it was at 56 and US of thyroid they did and it showed possible abnormality-we did check her TSH in June 2016 and it was normal.  Prior to Admission medications   Medication Sig Start Date End Date Taking? Authorizing Provider  aspirin 81 MG tablet Take by mouth. 11/13/13  Yes Historical Provider, MD  clobetasol cream (TEMOVATE) 0.05 % CLOBETASOL PROPIONATE, 0.05% (External Cream)  1 (one) Cream appkly twice daily for 0 days  Quantity: 1;  Refills: 0   Ordered :13-Nov-2013  Gilbert, Richard MD;  Started 13-Nov-2013 Active 11/13/13  Yes Historical Provider, MD  esomeprazole (NEXIUM) 20 MG capsule Take 1 capsule by mouth daily.   Yes Historical Provider, MD  Ferrous Sulfate (IRON) 142 (45 FE) MG TBCR Take 1 tablet by mouth daily as needed.   Yes Historical Provider, MD  fexofenadine-pseudoephedrine (ALLEGRA-D 24) 180-240 MG per 24 hr tablet  06/26/14  Yes Historical Provider, MD  furosemide (LASIX) 20 MG tablet Take by mouth. 12/12/13   Yes Historical Provider, MD  Hydrocortisone Acetate 1 % CREA HYDROCORTISONE ACETATE, 1% (External Cream)  1 (one) Cream daily for 0 days  Quantity: 1;  Refills: 0   Ordered :13-Nov-2013  Gilbert, Richard MD;  Started 13-Nov-2013 Active 11/13/13  Yes Historical Provider, MD  ipratropium (ATROVENT) 0.03 % nasal spray Place into the nose. 11/13/13  Yes Historical Provider, MD  losartan-hydrochlorothiazide (HYZAAR) 100-25 MG per tablet Take by mouth. 07/02/14  Yes Historical Provider, MD  MULTIPLE VITAMIN PO Take by mouth. 05/11/10  Yes Historical Provider, MD  potassium chloride SA (K-DUR,KLOR-CON) 20 MEQ tablet Take 20 mEq by mouth 2 (two) times daily.   Yes Historical Provider, MD  ranitidine (ZANTAC) 150 MG tablet Take 150 mg by mouth daily.   Yes Historical Provider, MD  ranitidine (ZANTAC) 75 MG tablet Take 75 mg by mouth at bedtime.   Yes Historical Provider, MD    Patient Active Problem List   Diagnosis Date Noted  . Absolute anemia 07/25/2014  . Anxiety 07/25/2014  . AB (asthmatic bronchitis) 07/25/2014  . 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 Cataract 07/25/2014  . Intervertebral cervical disc disorder with myelopathy, cervical region 07/25/2014  . Chest pain 07/25/2014  . Breath shortness 07/25/2014  . Edema leg 07/25/2014  . Fatigue 07/25/2014  . Acid reflux 07/25/2014  . Headache 07/25/2014  . 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 Arthralgia of hip 07/25/2014  . BP (high blood pressure)   07/25/2014  . HLD (hyperlipidemia) 07/25/2014  . Below normal amount of sodium in the blood 07/25/2014  . Allergic state 07/25/2014  . Neuropathy ([place]) 07/25/2014  . Arthritis, degenerative 07/25/2014  . Panaritium of finger 07/25/2014  . Peptic ulcer 07/25/2014  . Hive 07/25/2014  . Head revolving around 07/25/2014  . FOOT, PAIN 10/12/2007  . HYPERLIPIDEMIA 09/18/2007  . HYPERTENSION 09/18/2007  . PEPTIC ULCER DISEASE 09/18/2007  . PLANTAR FASCIITIS, LEFT 09/18/2007    No past  medical history on file.  Social History   Social History  . Marital Status: Widowed    Spouse Name: N/A  . Number of Children: N/A  . Years of Education: N/A   Occupational History  . Not on file.   Social History Main Topics  . Smoking status: Never Smoker   . Smokeless tobacco: Never Used  . Alcohol Use: No     Comment: Maybe 3 per year  . Drug Use: No  . Sexual Activity: No   Other Topics Concern  . Not on file   Social History Narrative    Allergies  Allergen Reactions  . Erythromycin     ALL MYCINS  . Keflex  [Cephalexin]     hives    Review of Systems  Constitutional: Positive for malaise/fatigue.  Respiratory: Negative.   Cardiovascular: Negative.   Gastrointestinal: Positive for heartburn.  Genitourinary: Positive for frequency.  Musculoskeletal: Positive for back pain and joint pain.  Neurological: Negative.   Endo/Heme/Allergies: Negative.   Psychiatric/Behavioral: Negative.     Immunization History  Administered Date(s) Administered  . Td 06/17/2004  . Zoster 04/14/2011   Objective:  There were no vitals taken for this visit.  Physical Exam  Constitutional: She is oriented to person, place, and time and well-developed, well-nourished, and in no distress.  HENT:  Head: Normocephalic and atraumatic.  Right Ear: External ear normal.  Left Ear: External ear normal.  Nose: Nose normal.  Eyes: Conjunctivae are normal.  Neck: Neck supple.  Cardiovascular: Normal rate, regular rhythm and normal heart sounds.   Pulmonary/Chest: Effort normal and breath sounds normal.  Abdominal: Soft.  Neurological: She is alert and oriented to person, place, and time.  Skin: Skin is warm and dry.  Psychiatric: Mood, memory, affect and judgment normal.    Lab Results  Component Value Date   WBC 6.3 08/22/2014   HGB 11.5* 02/05/2014   HCT 32.5* 08/22/2014   PLT 453* 02/05/2014   GLUCOSE 101* 08/22/2014   CHOL 216* 06/25/2013   TRIG 96 06/25/2013   HDL  71 08/22/2014   LDLCALC 133 06/25/2013   TSH 0.773 08/22/2014   INR 1.0 10/02/2013    CMP     Component Value Date/Time   NA 135 08/22/2014 1101   NA 136 10/29/2013 0645   NA 139 12/21/2010 1450   K 3.7 08/22/2014 1101   K 3.7 10/29/2013 0645   CL 95* 08/22/2014 1101   CL 99 10/29/2013 0645   CO2 27 08/22/2014 1101   CO2 32 10/29/2013 0645   GLUCOSE 101* 08/22/2014 1101   GLUCOSE 97 10/29/2013 0645   GLUCOSE 113* 12/21/2010 1450   BUN 20 08/22/2014 1101   BUN 14 10/29/2013 0645   BUN 16 12/21/2010 1450   CREATININE 1.08* 08/22/2014 1101   CREATININE 1.2* 02/05/2014   CREATININE 1.41* 10/29/2013 0645   CALCIUM 9.7 08/22/2014 1101   CALCIUM 10.0 10/29/2013 0645   PROT 6.6 08/22/2014 1101   PROT 6.4 10/11/2013 1355  ALBUMIN 2.4* 10/11/2013 1355   AST 19 08/22/2014 1101   AST 43* 10/11/2013 1355   ALT 18 08/22/2014 1101   ALT 33 10/11/2013 1355   ALKPHOS 58 08/22/2014 1101   ALKPHOS 85 10/11/2013 1355   BILITOT 0.4 08/22/2014 1101   BILITOT 0.6 10/11/2013 1355   GFRNONAA 50* 08/22/2014 1101   GFRNONAA 36* 10/29/2013 0645   GFRAA 57* 08/22/2014 1101   GFRAA 42* 10/29/2013 0645    Assessment and Plan :  1. Gastroesophageal reflux disease, esophagitis presence not specified   2. Other fatigue Stable. - CBC with Differential/Platelet - Comprehensive metabolic panel - TSH More than 50% time of this visit was spent in counseling with patient regarding chronic issues. 3. Thyroid cyst Evidently this was found on ultrasound at time of screening. Refer to endocrinologist for evaluation. Normal exam today. - TSH - Ambulatory referral to Endocrinology  4. Need for influenza vaccination  - Flu vaccine HIGH DOSE PF (Fluzone High dose)  I have done the exam and reviewed the above chart and it is accurate to the best of my knowledge.  Richard Gilbert MD [facility] 12/23/2014 3:02 PM 

## 2014-12-24 LAB — COMPREHENSIVE METABOLIC PANEL
A/G RATIO: 1.8 (ref 1.1–2.5)
ALT: 18 IU/L (ref 0–32)
AST: 19 IU/L (ref 0–40)
Albumin: 4.6 g/dL (ref 3.5–4.8)
Alkaline Phosphatase: 74 IU/L (ref 39–117)
BILIRUBIN TOTAL: 0.3 mg/dL (ref 0.0–1.2)
BUN/Creatinine Ratio: 14 (ref 11–26)
BUN: 15 mg/dL (ref 8–27)
CALCIUM: 10.3 mg/dL (ref 8.7–10.3)
CHLORIDE: 92 mmol/L — AB (ref 97–108)
CO2: 29 mmol/L (ref 18–29)
Creatinine, Ser: 1.05 mg/dL — ABNORMAL HIGH (ref 0.57–1.00)
GFR calc Af Amer: 59 mL/min/{1.73_m2} — ABNORMAL LOW (ref >59)
GFR, EST NON AFRICAN AMERICAN: 51 mL/min/{1.73_m2} — AB (ref >59)
GLUCOSE: 101 mg/dL — AB (ref 65–99)
Globulin, Total: 2.5 g/dL (ref 1.5–4.5)
POTASSIUM: 4.2 mmol/L (ref 3.5–5.2)
Sodium: 137 mmol/L (ref 134–144)
Total Protein: 7.1 g/dL (ref 6.0–8.5)

## 2014-12-24 LAB — CBC WITH DIFFERENTIAL/PLATELET
BASOS ABS: 0 10*3/uL (ref 0.0–0.2)
BASOS: 1 %
EOS (ABSOLUTE): 0.1 10*3/uL (ref 0.0–0.4)
Eos: 2 %
Hematocrit: 35.3 % (ref 34.0–46.6)
Hemoglobin: 11.5 g/dL (ref 11.1–15.9)
IMMATURE GRANULOCYTES: 0 %
Immature Grans (Abs): 0 10*3/uL (ref 0.0–0.1)
Lymphocytes Absolute: 2.5 10*3/uL (ref 0.7–3.1)
Lymphs: 39 %
MCH: 27.6 pg (ref 26.6–33.0)
MCHC: 32.6 g/dL (ref 31.5–35.7)
MCV: 85 fL (ref 79–97)
MONOS ABS: 0.5 10*3/uL (ref 0.1–0.9)
Monocytes: 8 %
NEUTROS PCT: 50 %
Neutrophils Absolute: 3.3 10*3/uL (ref 1.4–7.0)
PLATELETS: 440 10*3/uL — AB (ref 150–379)
RBC: 4.16 x10E6/uL (ref 3.77–5.28)
RDW: 13.8 % (ref 12.3–15.4)
WBC: 6.5 10*3/uL (ref 3.4–10.8)

## 2014-12-24 LAB — TSH: TSH: 0.688 u[IU]/mL (ref 0.450–4.500)

## 2015-04-23 ENCOUNTER — Encounter: Payer: Self-pay | Admitting: Family Medicine

## 2015-04-23 ENCOUNTER — Ambulatory Visit (INDEPENDENT_AMBULATORY_CARE_PROVIDER_SITE_OTHER): Payer: PPO | Admitting: Family Medicine

## 2015-04-23 VITALS — BP 148/62 | HR 88 | Temp 98.1°F | Resp 16 | Wt 177.0 lb

## 2015-04-23 DIAGNOSIS — I10 Essential (primary) hypertension: Secondary | ICD-10-CM

## 2015-04-23 DIAGNOSIS — E785 Hyperlipidemia, unspecified: Secondary | ICD-10-CM

## 2015-04-23 DIAGNOSIS — R5383 Other fatigue: Secondary | ICD-10-CM | POA: Diagnosis not present

## 2015-04-23 DIAGNOSIS — D649 Anemia, unspecified: Secondary | ICD-10-CM | POA: Diagnosis not present

## 2015-04-23 DIAGNOSIS — K219 Gastro-esophageal reflux disease without esophagitis: Secondary | ICD-10-CM

## 2015-04-23 NOTE — Progress Notes (Signed)
Patient ID: Sandra Fritz, female   DOB: 1934-04-12, 81 y.o.   MRN: [id number]    Subjective:  HPI  Hypertension, follow-up:  BP Readings from Last 3 Encounters:  04/23/15 148/62  12/23/14 138/52  08/21/14 142/56    She was last seen for hypertension 4 months ago.  BP at that visit was 138/52. Management since that visit includes none. She reports good compliance with treatment. She is not having side effects.  She is exercising at [employer] 3 times a week. She is adherent to low salt diet.   Outside blood pressures are 130-140's/60's. She is experiencing fatigue.  Patient denies chest pain, dyspnea, exertional chest pressure/discomfort, irregular heart beat and palpitations.   She reports that she has had some chest discomfort but it is the same discomfort that she has had the last year and had it worked up. She denies these pains coming more frequent or any worse. She thinks it is related to her GERD.  Wt Readings from Last 3 Encounters:  04/23/15 177 lb (80.287 kg)  12/23/14 172 lb (78.019 kg)  08/21/14 171 lb (77.565 kg)   ------------------------------------------------------------------------ GERD- She started taking OTC Nexium and looked up the side effects and they scared her so she started taking zantac 75 mg and Zantac 150 mg daily.      Prior to Admission medications   Medication Sig Start Date End Date Taking? Authorizing Provider  aspirin 81 MG tablet Take by mouth. 11/13/13  Yes Historical Provider, MD  esomeprazole (NEXIUM) 20 MG capsule Take 1 capsule by mouth daily.   Yes Historical Provider, MD  Ferrous Sulfate (IRON) 142 (45 FE) MG TBCR Take 1 tablet by mouth daily as needed.   Yes Historical Provider, MD  furosemide (LASIX) 20 MG tablet Take by mouth. 12/12/13  Yes Historical Provider, MD  losartan-hydrochlorothiazide (HYZAAR) 100-25 MG per tablet Take by mouth. 07/02/14  Yes Historical Provider, MD  Misc Natural Products (OSTEO BI-FLEX JOINT SHIELD PO)  Take 2 tablets by mouth daily.   Yes Historical Provider, MD  MULTIPLE VITAMIN PO Take by mouth. 05/11/10  Yes Historical Provider, MD  ranitidine (ZANTAC) 150 MG tablet Take 150 mg by mouth daily.   Yes Historical Provider, MD  ranitidine (ZANTAC) 75 MG tablet Take 75 mg by mouth at bedtime.   Yes Historical Provider, MD    Patient Active Problem List   Diagnosis Date Noted  . Absolute anemia 07/25/2014  . Anxiety 07/25/2014  . AB (asthmatic bronchitis) 07/25/2014  . 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 Cataract 07/25/2014  . Intervertebral cervical disc disorder with myelopathy, cervical region 07/25/2014  . Chest pain 07/25/2014  . Breath shortness 07/25/2014  . Edema leg 07/25/2014  . Fatigue 07/25/2014  . Acid reflux 07/25/2014  . Headache 07/25/2014  . 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 Arthralgia of hip 07/25/2014  . BP (high blood pressure) 07/25/2014  . HLD (hyperlipidemia) 07/25/2014  . Below normal amount of sodium in the blood 07/25/2014  . Allergic state 07/25/2014  . Neuropathy ([place]) 07/25/2014  . Arthritis, degenerative 07/25/2014  . Panaritium of finger 07/25/2014  . Peptic ulcer 07/25/2014  . Hive 07/25/2014  . Head revolving around 07/25/2014  . FOOT, PAIN 10/12/2007  . Hyperlipidemia 09/18/2007  . HYPERTENSION 09/18/2007  . PEPTIC ULCER DISEASE 09/18/2007  . PLANTAR FASCIITIS, LEFT 09/18/2007    History reviewed. No pertinent past medical history.  Social History   Social History  . Marital Status: Widowed    Spouse Name: N/A  .  Number of Children: N/A  . Years of Education: N/A   Occupational History  . Not on file.   Social History Main Topics  . Smoking status: Never Smoker   . Smokeless tobacco: Never Used  . Alcohol Use: No     Comment: Maybe 3 per year  . Drug Use: No  . Sexual Activity: No   Other Topics Concern  . Not on file   Social History Narrative    Allergies  Allergen Reactions  . Erythromycin     ALL  MYCINS  . Keflex  [Cephalexin]     hives    Review of Systems  Constitutional: Positive for malaise/fatigue.  HENT: Positive for congestion.   Eyes: Negative.   Respiratory: Positive for cough.   Cardiovascular: Negative.   Gastrointestinal: Positive for heartburn.  Genitourinary: Negative.   Musculoskeletal: Negative.   Skin: Negative.   Neurological: Negative.   Endo/Heme/Allergies: Negative.   Psychiatric/Behavioral: Negative.     Immunization History  Administered Date(s) Administered  . Influenza, High Dose Seasonal PF 12/23/2014  . Td 06/17/2004  . Zoster 04/14/2011   Objective:  BP 148/62 mmHg  Pulse 88  Temp(Src) 98.1 F (36.7 C) (Oral)  Resp 16  Wt 177 lb (80.287 kg)  Physical Exam  Lab Results  Component Value Date   WBC 6.5 12/23/2014   HGB 11.5* 02/05/2014   HCT 35.3 12/23/2014   PLT 440* 12/23/2014   GLUCOSE 101* 12/23/2014   CHOL 216* 06/25/2013   TRIG 96 06/25/2013   HDL 71 08/22/2014   LDLCALC 133 06/25/2013   TSH 0.688 12/23/2014   INR 1.0 10/02/2013    CMP     Component Value Date/Time   NA 137 12/23/2014 1605   NA 136 10/29/2013 0645   NA 139 12/21/2010 1450   K 4.2 12/23/2014 1605   K 3.7 10/29/2013 0645   CL 92* 12/23/2014 1605   CL 99 10/29/2013 0645   CO2 29 12/23/2014 1605   CO2 32 10/29/2013 0645   GLUCOSE 101* 12/23/2014 1605   GLUCOSE 97 10/29/2013 0645   GLUCOSE 113* 12/21/2010 1450   BUN 15 12/23/2014 1605   BUN 14 10/29/2013 0645   BUN 16 12/21/2010 1450   CREATININE 1.05* 12/23/2014 1605   CREATININE 1.2* 02/05/2014   CREATININE 1.41* 10/29/2013 0645   CALCIUM 10.3 12/23/2014 1605   CALCIUM 10.0 10/29/2013 0645   PROT 7.1 12/23/2014 1605   PROT 6.4 10/11/2013 1355   ALBUMIN 4.6 12/23/2014 1605   ALBUMIN 2.4* 10/11/2013 1355   AST 19 12/23/2014 1605   AST 43* 10/11/2013 1355   ALT 18 12/23/2014 1605   ALT 33 10/11/2013 1355   ALKPHOS 74 12/23/2014 1605   ALKPHOS 85 10/11/2013 1355   BILITOT 0.3  12/23/2014 1605   BILITOT 0.6 10/11/2013 1355   GFRNONAA 51* 12/23/2014 1605   GFRNONAA 36* 10/29/2013 0645   GFRAA 59* 12/23/2014 1605   GFRAA 42* 10/29/2013 0645    Assessment and Plan :  1. Essential hypertension  - TSH  2. Gastroesophageal reflux disease, esophagitis presence not specified  - Comprehensive metabolic panel  3. Hyperlipidemia  - Comprehensive metabolic panel - Lipid Panel With LDL/HDL Ratio  4. Other fatigue Stable. - TSH - CBC with Differential/Platelet  5. Anemia, unspecified anemia type  - CBC with Differential/Platelet   Patient was seen and examined by Dr. Miguel Aschoff, and noted scribed by Webb Laws, [place] MD [place]  04/23/2015 1:44 PM

## 2015-04-24 ENCOUNTER — Telehealth: Payer: Self-pay | Admitting: Family Medicine

## 2015-04-24 ENCOUNTER — Other Ambulatory Visit: Payer: Self-pay

## 2015-04-24 DIAGNOSIS — E785 Hyperlipidemia, unspecified: Secondary | ICD-10-CM | POA: Diagnosis not present

## 2015-04-24 DIAGNOSIS — K219 Gastro-esophageal reflux disease without esophagitis: Secondary | ICD-10-CM | POA: Diagnosis not present

## 2015-04-24 DIAGNOSIS — R5383 Other fatigue: Secondary | ICD-10-CM | POA: Diagnosis not present

## 2015-04-24 DIAGNOSIS — D649 Anemia, unspecified: Secondary | ICD-10-CM | POA: Diagnosis not present

## 2015-04-24 DIAGNOSIS — I10 Essential (primary) hypertension: Secondary | ICD-10-CM | POA: Diagnosis not present

## 2015-04-24 MED ORDER — FUROSEMIDE 20 MG PO TABS: 20.0000 mg | ORAL_TABLET | Freq: Two times a day (BID) | ORAL | Status: DC

## 2015-04-24 MED ORDER — LOSARTAN POTASSIUM-HCTZ 100-25 MG PO TABS: 1.0000 | ORAL_TABLET | Freq: Every day | ORAL | Status: DC

## 2015-04-24 NOTE — Telephone Encounter (Signed)
Patient needs 90 day prescriptions sent in for the following medications: losartan-hydrochlorothiazide (HYZAAR) 100-25 MG per tablet and furosemide (LASIX) 20 MG tablet  Patient is completely out of losartan-hydrochlorothiazide (HYZAAR) 100-25 MG per tablet.  She would like them sent to [facility].

## 2015-04-24 NOTE — Telephone Encounter (Signed)
error 

## 2015-04-25 LAB — CBC WITH DIFFERENTIAL/PLATELET
BASOS: 1 %
Basophils Absolute: 0 10*3/uL (ref 0.0–0.2)
EOS (ABSOLUTE): 0.2 10*3/uL (ref 0.0–0.4)
Eos: 3 %
HEMOGLOBIN: 11.8 g/dL (ref 11.1–15.9)
Hematocrit: 35.7 % (ref 34.0–46.6)
IMMATURE GRANS (ABS): 0 10*3/uL (ref 0.0–0.1)
Immature Granulocytes: 0 %
LYMPHS ABS: 2.4 10*3/uL (ref 0.7–3.1)
LYMPHS: 41 %
MCH: 28.5 pg (ref 26.6–33.0)
MCHC: 33.1 g/dL (ref 31.5–35.7)
MCV: 86 fL (ref 79–97)
MONOCYTES: 8 %
Monocytes Absolute: 0.5 10*3/uL (ref 0.1–0.9)
NEUTROS ABS: 2.7 10*3/uL (ref 1.4–7.0)
Neutrophils: 47 %
Platelets: 368 10*3/uL (ref 150–379)
RBC: 4.14 x10E6/uL (ref 3.77–5.28)
RDW: 13.4 % (ref 12.3–15.4)
WBC: 5.7 10*3/uL (ref 3.4–10.8)

## 2015-04-25 LAB — COMPREHENSIVE METABOLIC PANEL
A/G RATIO: 1.7 (ref 1.1–2.5)
ALBUMIN: 4.4 g/dL (ref 3.5–4.8)
ALT: 20 IU/L (ref 0–32)
AST: 24 IU/L (ref 0–40)
Alkaline Phosphatase: 61 IU/L (ref 39–117)
BILIRUBIN TOTAL: 0.4 mg/dL (ref 0.0–1.2)
BUN / CREAT RATIO: 15 (ref 11–26)
BUN: 20 mg/dL (ref 8–27)
CHLORIDE: 95 mmol/L — AB (ref 96–106)
CO2: 28 mmol/L (ref 18–29)
Calcium: 10.4 mg/dL — ABNORMAL HIGH (ref 8.7–10.3)
Creatinine, Ser: 1.35 mg/dL — ABNORMAL HIGH (ref 0.57–1.00)
GFR calc non Af Amer: 38 mL/min/{1.73_m2} — ABNORMAL LOW (ref >59)
GFR, EST AFRICAN AMERICAN: 43 mL/min/{1.73_m2} — AB (ref >59)
GLOBULIN, TOTAL: 2.6 g/dL (ref 1.5–4.5)
Glucose: 110 mg/dL — ABNORMAL HIGH (ref 65–99)
POTASSIUM: 4.9 mmol/L (ref 3.5–5.2)
SODIUM: 139 mmol/L (ref 134–144)
TOTAL PROTEIN: 7 g/dL (ref 6.0–8.5)

## 2015-04-25 LAB — LIPID PANEL WITH LDL/HDL RATIO
Cholesterol, Total: 256 mg/dL — ABNORMAL HIGH (ref 100–199)
HDL: 69 mg/dL (ref >39)
LDL Calculated: 161 mg/dL — ABNORMAL HIGH (ref 0–99)
LDl/HDL Ratio: 2.3 ratio units (ref 0.0–3.2)
Triglycerides: 129 mg/dL (ref 0–149)
VLDL CHOLESTEROL CAL: 26 mg/dL (ref 5–40)

## 2015-04-25 LAB — TSH: TSH: 1.01 u[IU]/mL (ref 0.450–4.500)

## 2015-05-26 ENCOUNTER — Ambulatory Visit (INDEPENDENT_AMBULATORY_CARE_PROVIDER_SITE_OTHER): Payer: PPO | Admitting: Family Medicine

## 2015-05-26 ENCOUNTER — Encounter: Payer: Self-pay | Admitting: Family Medicine

## 2015-05-26 VITALS — BP 144/62 | HR 82 | Temp 97.5°F | Resp 16 | Wt 175.0 lb

## 2015-05-26 DIAGNOSIS — I10 Essential (primary) hypertension: Secondary | ICD-10-CM

## 2015-05-26 DIAGNOSIS — N289 Disorder of kidney and ureter, unspecified: Secondary | ICD-10-CM

## 2015-05-26 DIAGNOSIS — R5383 Other fatigue: Secondary | ICD-10-CM

## 2015-05-26 NOTE — Patient Instructions (Signed)
Try Mediterranean diet is a anti inflammatory diet. Try Osteo Bi-flex for joint pains.

## 2015-05-26 NOTE — Progress Notes (Signed)
Patient ID: Sandra Fritz, female   DOB: 04-25-1934, 81 y.o.   MRN: [id number]    Subjective:  HPI Pt is here for a follow up and lab work. She was seen on 04/24/15 and labs were ordered her labs showed kidney function a little worse and calcium levels were borderline high. Repeat renal, ionized calcium and PTH on next OV.   Prior to Admission medications   Medication Sig Start Date End Date Taking? Authorizing Provider  aspirin 81 MG tablet Take by mouth. 11/13/13  Yes Historical Provider, MD  esomeprazole (NEXIUM) 20 MG capsule Take 1 capsule by mouth daily.   Yes Historical Provider, MD  Ferrous Sulfate (IRON) 142 (45 FE) MG TBCR Take 1 tablet by mouth daily as needed.   Yes Historical Provider, MD  furosemide (LASIX) 20 MG tablet Take 1 tablet (20 mg total) by mouth 2 (two) times daily. 04/24/15  Yes Henri Baumler Maceo Pro., MD  losartan-hydrochlorothiazide (HYZAAR) 100-25 MG tablet Take 1 tablet by mouth daily. 04/24/15  Yes Nakaya Mishkin Maceo Pro., MD  Misc Natural Products (OSTEO BI-FLEX JOINT SHIELD PO) Take 2 tablets by mouth daily.   Yes Historical Provider, MD  MULTIPLE VITAMIN PO Take by mouth. 05/11/10  Yes Historical Provider, MD  ranitidine (ZANTAC) 150 MG tablet Take 150 mg by mouth daily.   Yes Historical Provider, MD  ranitidine (ZANTAC) 75 MG tablet Take 75 mg by mouth at bedtime.   Yes Historical Provider, MD    Patient Active Problem List   Diagnosis Date Noted  . Absolute anemia 07/25/2014  . Anxiety 07/25/2014  . AB (asthmatic bronchitis) 07/25/2014  . 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 Cataract 07/25/2014  . Intervertebral cervical disc disorder with myelopathy, cervical region 07/25/2014  . Chest pain 07/25/2014  . Breath shortness 07/25/2014  . Edema leg 07/25/2014  . Fatigue 07/25/2014  . Acid reflux 07/25/2014  . Headache 07/25/2014  . 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 Arthralgia of hip 07/25/2014  . BP (high blood pressure) 07/25/2014  . HLD  (hyperlipidemia) 07/25/2014  . Below normal amount of sodium in the blood 07/25/2014  . Allergic state 07/25/2014  . Neuropathy ([place]) 07/25/2014  . Arthritis, degenerative 07/25/2014  . Panaritium of finger 07/25/2014  . Peptic ulcer 07/25/2014  . Hive 07/25/2014  . Head revolving around 07/25/2014  . FOOT, PAIN 10/12/2007  . Hyperlipidemia 09/18/2007  . Hypertension 09/18/2007  . PEPTIC ULCER DISEASE 09/18/2007  . PLANTAR FASCIITIS, LEFT 09/18/2007    History reviewed. No pertinent past medical history.  Social History   Social History  . Marital Status: Widowed    Spouse Name: N/A  . Number of Children: N/A  . Years of Education: N/A   Occupational History  . Not on file.   Social History Main Topics  . Smoking status: Never Smoker   . Smokeless tobacco: Never Used  . Alcohol Use: No     Comment: Maybe 3 per year  . Drug Use: No  . Sexual Activity: No   Other Topics Concern  . Not on file   Social History Narrative    Allergies  Allergen Reactions  . Erythromycin     ALL MYCINS  . Keflex  [Cephalexin]     hives    Review of Systems  Constitutional: Positive for malaise/fatigue.  HENT: Negative.   Eyes: Negative.   Respiratory: Negative.   Cardiovascular: Negative.   Gastrointestinal: Negative.   Genitourinary: Negative.   Musculoskeletal: Negative.   Skin: Negative.  Neurological: Negative.   Endo/Heme/Allergies: Negative.   Psychiatric/Behavioral: Negative.     Immunization History  Administered Date(s) Administered  . Influenza, High Dose Seasonal PF 12/23/2014  . Td 06/17/2004  . Zoster 04/14/2011   Objective:  BP 144/62 mmHg  Pulse 82  Temp(Src) 97.5 F (36.4 C) (Oral)  Resp 16  Wt 175 lb (79.379 kg)  Physical Exam  Constitutional: She is oriented to person, place, and time and well-developed, well-nourished, and in no distress.  HENT:  Head: Normocephalic and atraumatic.  Right Ear: External ear normal.  Left Ear: External  ear normal.  Nose: Nose normal.  Eyes: Conjunctivae and EOM are normal. Pupils are equal, round, and reactive to light.  Neck: Neck supple. Thyromegaly (mild) present.  Cardiovascular: Normal rate, regular rhythm, normal heart sounds and intact distal pulses.   Pulmonary/Chest: Effort normal and breath sounds normal.  Abdominal: Soft.  Musculoskeletal: Normal range of motion.  Neurological: She is alert and oriented to person, place, and time. She has normal reflexes. Gait normal. GCS score is 15.  Skin: Skin is warm and dry.  Fair skin, no obvious lesions.  Psychiatric: Mood, memory, affect and judgment normal.    Lab Results  Component Value Date   WBC 5.7 04/24/2015   HGB 11.5* 02/05/2014   HCT 35.7 04/24/2015   PLT 368 04/24/2015   GLUCOSE 110* 04/24/2015   CHOL 256* 04/24/2015   TRIG 129 04/24/2015   HDL 69 04/24/2015   LDLCALC 161* 04/24/2015   TSH 1.010 04/24/2015   INR 1.0 10/02/2013    CMP     Component Value Date/Time   NA 139 04/24/2015 1024   NA 136 10/29/2013 0645   NA 139 12/21/2010 1450   K 4.9 04/24/2015 1024   K 3.7 10/29/2013 0645   CL 95* 04/24/2015 1024   CL 99 10/29/2013 0645   CO2 28 04/24/2015 1024   CO2 32 10/29/2013 0645   GLUCOSE 110* 04/24/2015 1024   GLUCOSE 97 10/29/2013 0645   GLUCOSE 113* 12/21/2010 1450   BUN 20 04/24/2015 1024   BUN 14 10/29/2013 0645   BUN 16 12/21/2010 1450   CREATININE 1.35* 04/24/2015 1024   CREATININE 1.2* 02/05/2014   CREATININE 1.41* 10/29/2013 0645   CALCIUM 10.4* 04/24/2015 1024   CALCIUM 10.0 10/29/2013 0645   PROT 7.0 04/24/2015 1024   PROT 6.4 10/11/2013 1355   ALBUMIN 4.4 04/24/2015 1024   ALBUMIN 2.4* 10/11/2013 1355   AST 24 04/24/2015 1024   AST 43* 10/11/2013 1355   ALT 20 04/24/2015 1024   ALT 33 10/11/2013 1355   ALKPHOS 61 04/24/2015 1024   ALKPHOS 85 10/11/2013 1355   BILITOT 0.4 04/24/2015 1024   BILITOT 0.6 10/11/2013 1355   GFRNONAA 38* 04/24/2015 1024   GFRNONAA 36* 10/29/2013  0645   GFRAA 43* 04/24/2015 1024   GFRAA 42* 10/29/2013 0645    Assessment and Plan :  1. Essential hypertension   2. Other fatigue multi-factorial.more than 50% of visit spent in counseling regarding these issues.  3. Hypercalcemia Stop supplemental calcium - PTH, intact and calcium - Calcium, ionized  4. Kidney function abnormal/hypertensive nephropathy Mild hypertensive nephropathy - Renal function panel . I have done the exam and reviewed the above chart and it is accurate to the best of my knowledge.  Patient was seen and examined by Dr. Miguel Aschoff, and noted scribed by Webb Laws, [place] [facility] 05/26/2015 11:44 AM

## 2015-05-27 LAB — RENAL FUNCTION PANEL
ALBUMIN: 4.7 g/dL (ref 3.5–4.8)
BUN/Creatinine Ratio: 17 (ref 11–26)
BUN: 19 mg/dL (ref 8–27)
CALCIUM: 10 mg/dL (ref 8.7–10.3)
CO2: 28 mmol/L (ref 18–29)
CREATININE: 1.15 mg/dL — AB (ref 0.57–1.00)
Chloride: 92 mmol/L — ABNORMAL LOW (ref 96–106)
GFR calc Af Amer: 53 mL/min/{1.73_m2} — ABNORMAL LOW (ref >59)
GFR, EST NON AFRICAN AMERICAN: 46 mL/min/{1.73_m2} — AB (ref >59)
Glucose: 114 mg/dL — ABNORMAL HIGH (ref 65–99)
PHOSPHORUS: 3.1 mg/dL (ref 2.5–4.5)
Potassium: 3.9 mmol/L (ref 3.5–5.2)
SODIUM: 140 mmol/L (ref 134–144)

## 2015-05-27 LAB — CALCIUM, IONIZED: CALCIUM ION: 5.3 mg/dL (ref 4.5–5.6)

## 2015-05-27 LAB — PTH, INTACT AND CALCIUM: PTH: 66 pg/mL — AB (ref 15–65)

## 2015-06-03 ENCOUNTER — Telehealth: Payer: Self-pay

## 2015-06-03 DIAGNOSIS — R7989 Other specified abnormal findings of blood chemistry: Secondary | ICD-10-CM

## 2015-06-03 NOTE — Telephone Encounter (Signed)
Advised patient as below. Please refer patient as below. Thanks!

## 2015-06-03 NOTE — Telephone Encounter (Signed)
-----   Message from Jerrol Banana., MD sent at 06/02/2015 10:17 AM EDT ----- Calcium better but PTH mildly elevated. Consider referring to Dr. Harlow Asa in [place]. Let patient know this is almost certainly not a cancer but it could be a parathyroid adenoma,which is benign

## 2015-06-03 NOTE — Telephone Encounter (Signed)
Tried calling patient and no answer. Will try again later.  

## 2015-06-08 NOTE — Telephone Encounter (Signed)
Please add order to EPIC °

## 2015-06-08 NOTE — Telephone Encounter (Signed)
Order put in-aa 

## 2015-06-23 ENCOUNTER — Telehealth: Payer: Self-pay | Admitting: Family Medicine

## 2015-06-23 NOTE — Telephone Encounter (Signed)
FYI---[facility] Endocrinology department have made attempts to contact pt to set up appointment including mailing letter to pt on 06/05/15.I have spoken to pt and gave her their contact information

## 2015-06-23 NOTE — Telephone Encounter (Signed)
FYI_aa 

## 2015-07-07 DIAGNOSIS — E042 Nontoxic multinodular goiter: Secondary | ICD-10-CM | POA: Diagnosis not present

## 2015-07-07 DIAGNOSIS — N183 Chronic kidney disease, stage 3 (moderate): Secondary | ICD-10-CM | POA: Diagnosis not present

## 2015-07-07 DIAGNOSIS — E213 Hyperparathyroidism, unspecified: Secondary | ICD-10-CM | POA: Diagnosis not present

## 2015-07-07 DIAGNOSIS — M8589 Other specified disorders of bone density and structure, multiple sites: Secondary | ICD-10-CM | POA: Diagnosis not present

## 2015-07-09 LAB — BASIC METABOLIC PANEL
BUN: 17 mg/dL (ref 4–21)
Creatinine: 1 mg/dL (ref 0.5–1.1)
Glucose: 107 mg/dL
Potassium: 3.9 mmol/L (ref 3.4–5.3)
Sodium: 136 mmol/L — AB (ref 137–147)

## 2015-07-09 LAB — HEPATIC FUNCTION PANEL
ALT: 15 U/L (ref 7–35)
AST: 18 U/L (ref 13–35)
Alkaline Phosphatase: 55 U/L (ref 25–125)
BILIRUBIN, TOTAL: 0.4 mg/dL

## 2015-07-20 ENCOUNTER — Encounter: Payer: Self-pay | Admitting: Family Medicine

## 2015-07-23 ENCOUNTER — Ambulatory Visit (INDEPENDENT_AMBULATORY_CARE_PROVIDER_SITE_OTHER): Payer: PPO | Admitting: Family Medicine

## 2015-07-23 ENCOUNTER — Encounter: Payer: Self-pay | Admitting: Family Medicine

## 2015-07-23 VITALS — BP 138/68 | HR 80 | Temp 98.3°F | Resp 16 | Wt 176.0 lb

## 2015-07-23 DIAGNOSIS — R252 Cramp and spasm: Secondary | ICD-10-CM

## 2015-07-23 DIAGNOSIS — I10 Essential (primary) hypertension: Secondary | ICD-10-CM | POA: Diagnosis not present

## 2015-07-23 DIAGNOSIS — L309 Dermatitis, unspecified: Secondary | ICD-10-CM | POA: Diagnosis not present

## 2015-07-23 MED ORDER — LOSARTAN POTASSIUM 100 MG PO TABS: 100.0000 mg | ORAL_TABLET | Freq: Every day | ORAL | Status: DC

## 2015-07-23 NOTE — Patient Instructions (Signed)
Start Magnesium Oxide 400mg  daily for cramps.

## 2015-07-23 NOTE — Progress Notes (Signed)
Patient ID: Sandra Fritz, female   DOB: [birth year], 81 y.o.   MRN: [id number]       Patient: Sandra Fritz Female    DOB: 1935-02-07   81 y.o.   MRN: [id number] Visit Date: 07/23/2015  Today's Provider: Wilhemena Durie, MD   Chief Complaint  Patient presents with  . Hyperkalemia  . Rash   Subjective:    HPI Patient is here today to follow up on elevated calcium levels. On last visit, patient was referred to Dr. Gabriel Carina and she reports that her calcium was back to normal. She also states that she recommended that patient discontinue all calcium supplements. Patient also reports that Dr. Gabriel Carina told her that if her calcium is elevated in the future, she would need to discuss stopping her BP medication.  Patient is also concerned about a rash that has developed on her arms. She reports that the rash comes and goes. She also states that she has mentioned this before in the past and we referred her to dermatology.     Allergies  Allergen Reactions  . Erythromycin     ALL MYCINS  . Keflex  [Cephalexin]     hives   Previous Medications   ASPIRIN 81 MG TABLET    Take by mouth.   ESOMEPRAZOLE (NEXIUM) 20 MG CAPSULE    Take 1 capsule by mouth daily.   FERROUS SULFATE (IRON) 142 (45 FE) MG TBCR    Take 1 tablet by mouth daily as needed.   FUROSEMIDE (LASIX) 20 MG TABLET    Take 1 tablet (20 mg total) by mouth 2 (two) times daily.   LOSARTAN-HYDROCHLOROTHIAZIDE (HYZAAR) 100-25 MG TABLET    Take 1 tablet by mouth daily.   MISC NATURAL PRODUCTS (OSTEO BI-FLEX JOINT SHIELD PO)    Take 2 tablets by mouth daily.   MULTIPLE VITAMIN PO    Take by mouth.   RANITIDINE (ZANTAC) 150 MG TABLET    Take 150 mg by mouth daily. Reported on 07/23/2015   RANITIDINE (ZANTAC) 75 MG TABLET    Take 75 mg by mouth at bedtime. Reported on 07/23/2015    Review of Systems  Constitutional: Negative.   HENT: Negative.   Eyes: Negative.   Cardiovascular: Negative.   Endocrine: Negative.   Musculoskeletal:  Positive for joint swelling and arthralgias. Negative for myalgias, back pain, gait problem, neck pain and neck stiffness.  Skin: Positive for color change and rash. Negative for pallor and wound.  Allergic/Immunologic: Negative.   Neurological: Negative.   Hematological: Negative.   Psychiatric/Behavioral: The patient is nervous/anxious.     Social History  Substance Use Topics  . Smoking status: Never Smoker   . Smokeless tobacco: Never Used  . Alcohol Use: No     Comment: Maybe 3 per year   Objective:   BP 138/68 mmHg  Pulse 80  Temp(Src) 98.3 F (36.8 C)  Resp 16  Wt 176 lb (79.833 kg)  Physical Exam  Constitutional: She is oriented to person, place, and time. She appears well-developed and well-nourished.  HENT:  Head: Normocephalic and atraumatic.  Right Ear: External ear normal.  Left Ear: External ear normal.  Nose: Nose normal.  Eyes: Conjunctivae are normal.  Neck: Neck supple.  Cardiovascular: Normal rate, regular rhythm and normal heart sounds.   Pulmonary/Chest: Effort normal and breath sounds normal.  Abdominal: Soft.  Neurological: She is alert and oriented to person, place, and time. No cranial nerve deficit.  Skin: Skin is warm  and dry.  Psychiatric: She has a normal mood and affect. Her behavior is normal. Judgment and thought content normal.        Assessment & Plan:     1. Essential hypertension DC HCTZ. - losartan (COZAAR) 100 MG tablet; Take 1 tablet (100 mg total) by mouth daily.  Dispense: 30 tablet; Refill: 3 RTC 1-2 months off of HCTZ. 2. Dermatitis  - Ambulatory referral to Dermatology 3.Fatigue 4.Anxiety Chronic,mild. 5.Hypercalcemia Benign workup by endocrinology. I have done the exam and reviewed the above chart and it is accurate to the best of my knowledge.       Lennart Gladish Cranford Mon, MD  [facility]

## 2015-07-24 ENCOUNTER — Telehealth: Payer: Self-pay | Admitting: Family Medicine

## 2015-07-24 NOTE — Telephone Encounter (Signed)
Pt states the prescription for Furosemide was written for 1 tablet 2 times daily and losartan was written for 1 tablet daily.She wants to know if instructions are correct.She thought it was the other way around.Pt advised you are not in office this afternoon

## 2015-07-24 NOTE — Telephone Encounter (Signed)
[id number] 

## 2015-07-24 NOTE — Telephone Encounter (Signed)
What is written here is correct.

## 2015-07-27 NOTE — Telephone Encounter (Signed)
[id number] 

## 2015-07-30 NOTE — Telephone Encounter (Signed)
Pt advised-aa 

## 2015-08-28 DIAGNOSIS — L280 Lichen simplex chronicus: Secondary | ICD-10-CM | POA: Diagnosis not present

## 2015-09-09 ENCOUNTER — Ambulatory Visit (INDEPENDENT_AMBULATORY_CARE_PROVIDER_SITE_OTHER): Payer: PPO | Admitting: Family Medicine

## 2015-09-09 VITALS — BP 132/52 | HR 80 | Temp 98.2°F | Resp 16 | Wt 180.0 lb

## 2015-09-09 DIAGNOSIS — R5383 Other fatigue: Secondary | ICD-10-CM

## 2015-09-09 DIAGNOSIS — E785 Hyperlipidemia, unspecified: Secondary | ICD-10-CM

## 2015-09-09 DIAGNOSIS — R252 Cramp and spasm: Secondary | ICD-10-CM | POA: Diagnosis not present

## 2015-09-09 DIAGNOSIS — I10 Essential (primary) hypertension: Secondary | ICD-10-CM | POA: Diagnosis not present

## 2015-09-09 NOTE — Progress Notes (Signed)
Patient ID: Sandra Fritz, female   DOB: 02-11-1935, 81 y.o.   MRN: [id number]    Subjective:  HPI  Patient is here for 1 month follow up. On last visit HCTZ was stopped and patient is taking Losartan only for b/p. Not sure of the reason the change was made patient states last time she was fatigue and had a rash and she was referred to dermatologist for that. Fatigue level is is not better since the switch and she has not been checking her b/p.  BP Readings from Last 3 Encounters:  09/09/15 132/52  07/23/15 138/68  05/26/15 144/62    Prior to Admission medications   Medication Sig Start Date End Date Taking? Authorizing Provider  aspirin 81 MG tablet Take by mouth. 11/13/13   Historical Provider, MD  esomeprazole (NEXIUM) 20 MG capsule Take 1 capsule by mouth daily.    Historical Provider, MD  Ferrous Sulfate (IRON) 142 (45 FE) MG TBCR Take 1 tablet by mouth daily as needed.    Historical Provider, MD  furosemide (LASIX) 20 MG tablet Take 1 tablet (20 mg total) by mouth 2 (two) times daily. 04/24/15   Shaurya Rawdon Maceo Pro., MD  losartan (COZAAR) 100 MG tablet Take 1 tablet (100 mg total) by mouth daily. 07/23/15   Aliah Eriksson Maceo Pro., MD  Misc Natural Products (OSTEO BI-FLEX JOINT SHIELD PO) Take 2 tablets by mouth daily.    Historical Provider, MD  MULTIPLE VITAMIN PO Take by mouth. 05/11/10   Historical Provider, MD  ranitidine (ZANTAC) 150 MG tablet Take 150 mg by mouth daily. Reported on 07/23/2015    Historical Provider, MD  ranitidine (ZANTAC) 75 MG tablet Take 75 mg by mouth at bedtime. Reported on 07/23/2015    Historical Provider, MD    Patient Active Problem List   Diagnosis Date Noted  . Absolute anemia 07/25/2014  . Anxiety 07/25/2014  . AB (asthmatic bronchitis) 07/25/2014  . 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 Cataract 07/25/2014  . Intervertebral cervical disc disorder with myelopathy, cervical region 07/25/2014  . Chest pain 07/25/2014  . Breath shortness  07/25/2014  . Edema leg 07/25/2014  . Fatigue 07/25/2014  . Acid reflux 07/25/2014  . Headache 07/25/2014  . 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 Arthralgia of hip 07/25/2014  . BP (high blood pressure) 07/25/2014  . HLD (hyperlipidemia) 07/25/2014  . Below normal amount of sodium in the blood 07/25/2014  . Allergic state 07/25/2014  . Neuropathy ([place]) 07/25/2014  . Arthritis, degenerative 07/25/2014  . Panaritium of finger 07/25/2014  . Peptic ulcer 07/25/2014  . Hive 07/25/2014  . Head revolving around 07/25/2014  . FOOT, PAIN 10/12/2007  . Hyperlipidemia 09/18/2007  . Hypertension 09/18/2007  . PEPTIC ULCER DISEASE 09/18/2007  . PLANTAR FASCIITIS, LEFT 09/18/2007    No past medical history on file.  Social History   Social History  . Marital Status: Widowed    Spouse Name: N/A  . Number of Children: N/A  . Years of Education: N/A   Occupational History  . Not on file.   Social History Main Topics  . Smoking status: Never Smoker   . Smokeless tobacco: Never Used  . Alcohol Use: No     Comment: Maybe 3 per year  . Drug Use: No  . Sexual Activity: No   Other Topics Concern  . Not on file   Social History Narrative    Allergies  Allergen Reactions  . Erythromycin     ALL MYCINS  .  Keflex  [Cephalexin]     hives    Review of Systems  Constitutional: Positive for malaise/fatigue.  Respiratory: Negative.   Cardiovascular: Positive for leg swelling. Negative for chest pain and palpitations.  Musculoskeletal: Positive for myalgias, back pain and joint pain.  Skin: Positive for rash.  Neurological: Positive for weakness.  Psychiatric/Behavioral: The patient is nervous/anxious.     Immunization History  Administered Date(s) Administered  . Influenza, High Dose Seasonal PF 12/23/2014  . Td 06/17/2004  . Zoster 04/14/2011   Objective:  BP 132/52 mmHg  Pulse 80  Temp(Src) 98.2 F (36.8 C)  Resp 16  Wt 180 lb (81.647 kg)  Physical Exam    Constitutional: She is oriented to person, place, and time and well-developed, well-nourished, and in no distress.  HENT:  Head: Normocephalic and atraumatic.  Eyes: Conjunctivae are normal. Pupils are equal, round, and reactive to light.  Neck: Normal range of motion. Neck supple.  Cardiovascular: Normal rate, regular rhythm, normal heart sounds and intact distal pulses.   No murmur heard. Pulmonary/Chest: Effort normal and breath sounds normal.  Musculoskeletal: She exhibits edema (1+). She exhibits no tenderness.  Neurological: She is alert and oriented to person, place, and time.  Psychiatric: Mood, memory, affect and judgment normal.    Lab Results  Component Value Date   WBC 5.7 04/24/2015   HGB 11.5* 02/05/2014   HCT 35.7 04/24/2015   PLT 368 04/24/2015   GLUCOSE 114* 05/26/2015   CHOL 256* 04/24/2015   TRIG 129 04/24/2015   HDL 69 04/24/2015   LDLCALC 161* 04/24/2015   TSH 1.010 04/24/2015   INR 1.0 10/02/2013    CMP     Component Value Date/Time   NA 136* 07/09/2015   NA 136 10/29/2013 0645   NA 139 12/21/2010 1450   K 3.9 07/09/2015   K 3.7 10/29/2013 0645   CL 92* 05/26/2015 1205   CL 99 10/29/2013 0645   CO2 28 05/26/2015 1205   CO2 32 10/29/2013 0645   GLUCOSE 114* 05/26/2015 1205   GLUCOSE 97 10/29/2013 0645   GLUCOSE 113* 12/21/2010 1450   BUN 17 07/09/2015   BUN 14 10/29/2013 0645   BUN 16 12/21/2010 1450   CREATININE 1.0 07/09/2015   CREATININE 1.15* 05/26/2015 1205   CREATININE 1.41* 10/29/2013 0645   CALCIUM 10.0 05/26/2015 1205   CALCIUM 10.0 10/29/2013 0645   PROT 7.0 04/24/2015 1024   PROT 6.4 10/11/2013 1355   ALBUMIN 4.7 05/26/2015 1205   ALBUMIN 2.4* 10/11/2013 1355   AST 18 07/09/2015   AST 43* 10/11/2013 1355   ALT 15 07/09/2015   ALT 33 10/11/2013 1355   ALKPHOS 55 07/09/2015   ALKPHOS 85 10/11/2013 1355   BILITOT 0.4 04/24/2015 1024   BILITOT 0.6 10/11/2013 1355   GFRNONAA 46* 05/26/2015 1205   GFRNONAA 36* 10/29/2013 0645    GFRAA 53* 05/26/2015 1205   GFRAA 42* 10/29/2013 0645    Assessment and Plan :  1. Essential hypertension Stable off HCTZ. Continue Losartan and will continue to follow.  2. Muscle cramps Advised patient to try bananas and Magnesium Oxide 2 times daily OTC, then can try Tonic water Follow.  3. Other fatigue Unchanged/stable.  4. HLD (hyperlipidemia) 5. Dermatitis Rash on forearms is at least 50% improved from last visit. Following dermatologist, advised patient to continue soaking hands in clorox water daily as recommended per specialist, skin is better. 6.GAD Chronic anxiety present. Patient was seen and examined by Dr. Eulas Post  and note was scribed by Theressa Millard, RMA.    Miguel Aschoff MD [facility] 09/09/2015 11:10 AM

## 2015-09-09 NOTE — Patient Instructions (Signed)
FOR MUSCLE CRAMPS TRY BANNANAS, ALSO TRY MAGNESIUM OXIDE OVER THE COUNTER AND TAKE IT 1 TABLET TWICE DAILY, AND IF AFTER 1 MONTH WITH THIS REGIMEN MUSCLE CRAMPS ARE NOT BETTER CAN TRY TONIC WATER 1 BOTTLE DAILY.

## 2015-09-30 DIAGNOSIS — Z0000 Encounter for general adult medical examination without abnormal findings: Secondary | ICD-10-CM | POA: Diagnosis not present

## 2015-10-22 ENCOUNTER — Ambulatory Visit: Payer: PPO | Admitting: Family Medicine

## 2015-11-14 ENCOUNTER — Other Ambulatory Visit: Payer: Self-pay | Admitting: Family Medicine

## 2015-11-14 DIAGNOSIS — I10 Essential (primary) hypertension: Secondary | ICD-10-CM

## 2015-12-10 ENCOUNTER — Ambulatory Visit (INDEPENDENT_AMBULATORY_CARE_PROVIDER_SITE_OTHER): Payer: PPO | Admitting: Family Medicine

## 2015-12-10 VITALS — BP 152/68 | HR 80 | Temp 98.1°F | Resp 16 | Wt 184.0 lb

## 2015-12-10 DIAGNOSIS — E785 Hyperlipidemia, unspecified: Secondary | ICD-10-CM | POA: Diagnosis not present

## 2015-12-10 DIAGNOSIS — R739 Hyperglycemia, unspecified: Secondary | ICD-10-CM | POA: Diagnosis not present

## 2015-12-10 DIAGNOSIS — T148XXA Other injury of unspecified body region, initial encounter: Secondary | ICD-10-CM

## 2015-12-10 DIAGNOSIS — K219 Gastro-esophageal reflux disease without esophagitis: Secondary | ICD-10-CM

## 2015-12-10 DIAGNOSIS — R059 Cough, unspecified: Secondary | ICD-10-CM

## 2015-12-10 DIAGNOSIS — I10 Essential (primary) hypertension: Secondary | ICD-10-CM

## 2015-12-10 DIAGNOSIS — R05 Cough: Secondary | ICD-10-CM | POA: Diagnosis not present

## 2015-12-10 DIAGNOSIS — T148 Other injury of unspecified body region: Secondary | ICD-10-CM

## 2015-12-10 LAB — POCT GLYCOSYLATED HEMOGLOBIN (HGB A1C): Hemoglobin A1C: 6.1

## 2015-12-10 MED ORDER — FISH OIL 1200 MG PO CAPS: ORAL_CAPSULE | ORAL | 0 refills | Status: DC

## 2015-12-10 NOTE — Patient Instructions (Signed)
Basic Carbohydrate Counting  Carbohydrate counting is a method for keeping track of the amount of carbohydrates you eat. Eating carbohydrates naturally increases the level of sugar (glucose) in your blood, so it is important for you to know the amount that is okay for you to have in every meal. Carbohydrate counting helps keep the level of glucose in your blood within normal limits. The amount of carbohydrates allowed is different for every person. A dietitian can help you calculate the amount that is right for you. Once you know the amount of carbohydrates you can have, you can count the carbohydrates in the foods you want to eat. Carbohydrates are found in the following foods:  Grains, such as breads and cereals.  Dried beans and soy products.  Starchy vegetables, such as potatoes, peas, and corn. Fruit and fruit juices.Hyperglycemia Hyperglycemia occurs when the glucose (sugar) in your blood is too high. Hyperglycemia can happen for many reasons, but it most often happens to people who do not know they have diabetes or are not managing their diabetes properly.  CAUSES  Whether you have diabetes or not, there are other causes of hyperglycemia. Hyperglycemia can occur when you have diabetes, but it can also occur in other situations that you might not be as aware of, such as: Diabetes  If you have diabetes and are having problems controlling your blood glucose, hyperglycemia could occur because of some of the following reasons:  Not following your meal plan.  Not taking your diabetes medications or not taking it properly.  Exercising less or doing less activity than you normally do.  Being sick. Pre-diabetes  This cannot be ignored. Before people develop Type 2 diabetes, they almost always have "pre-diabetes." This is when your blood glucose levels are higher than normal, but not yet high enough to be diagnosed as diabetes. Research has shown that some long-term damage to the body,  especially the heart and circulatory system, may already be occurring during pre-diabetes. If you take action to manage your blood glucose when you have pre-diabetes, you may delay or prevent Type 2 diabetes from developing. Stress  If you have diabetes, you may be "diet" controlled or on oral medications or insulin to control your diabetes. However, you may find that your blood glucose is higher than usual in the hospital whether you have diabetes or not. This is often referred to as "stress hyperglycemia." Stress can elevate your blood glucose. This happens because of hormones put out by the body during times of stress. If stress has been the cause of your high blood glucose, it can be followed regularly by your caregiver. That way he/she can make sure your hyperglycemia does not continue to get worse or progress to diabetes. Steroids  Steroids are medications that act on the infection fighting system (immune system) to block inflammation or infection. One side effect can be a rise in blood glucose. Most people can produce enough extra insulin to allow for this rise, but for those who cannot, steroids make blood glucose levels go even higher. It is not unusual for steroid treatments to "uncover" diabetes that is developing. It is not always possible to determine if the hyperglycemia will go away after the steroids are stopped. A special blood test called an A1c is sometimes done to determine if your blood glucose was elevated before the steroids were started. SYMPTOMS  Thirsty.  Frequent urination.  Dry mouth.  Blurred vision.  Tired or fatigue.  Weakness.  Sleepy.  Tingling in feet  or leg. DIAGNOSIS  Diagnosis is made by monitoring blood glucose in one or all of the following ways:  A1c test. This is a chemical found in your blood.  Fingerstick blood glucose monitoring.  Laboratory results. TREATMENT  First, knowing the cause of the hyperglycemia is important before the  hyperglycemia can be treated. Treatment may include, but is not be limited to:  Education.  Change or adjustment in medications.  Change or adjustment in meal plan.  Treatment for an illness, infection, etc.  More frequent blood glucose monitoring.  Change in exercise plan.  Decreasing or stopping steroids.  Lifestyle changes. HOME CARE INSTRUCTIONS   Test your blood glucose as directed.  Exercise regularly. Your caregiver will give you instructions about exercise. Pre-diabetes or diabetes which comes on with stress is helped by exercising.  Eat wholesome, balanced meals. Eat often and at regular, fixed times. Your caregiver or nutritionist will give you a meal plan to guide your sugar intake.  Being at an ideal weight is important. If needed, losing as little as 10 to 15 pounds may help improve blood glucose levels. SEEK MEDICAL CARE IF:   You have questions about medicine, activity, or diet.  You continue to have symptoms (problems such as increased thirst, urination, or weight gain). SEEK IMMEDIATE MEDICAL CARE IF:   You are vomiting or have diarrhea.  Your breath smells fruity.  You are breathing faster or slower.  You are very sleepy or incoherent.  You have numbness, tingling, or pain in your feet or hands.  You have chest pain.  Your symptoms get worse even though you have been following your caregiver's orders.  If you have any other questions or concerns.   This information is not intended to replace advice given to you by your health care provider. Make sure you discuss any questions you have with your health care provider.   Document Released: 08/31/2000 Document Revised: 05/30/2011 Document Reviewed: 11/11/2014 Elsevier Interactive Patient Education 2016 [employer] and yogurt.  Sweets and snack foods, such as cake, cookies, candy, chips, soft drinks, and fruit drinks. CARBOHYDRATE COUNTING There are two ways to count the  carbohydrates in your food. You can use either of the methods or a combination of both. Reading the "Nutrition Facts" on [place] The "Nutrition Facts" is an area that is included on the labels of almost all packaged food and beverages in the [place]. It includes the serving size of that food or beverage and information about the nutrients in each serving of the food, including the grams (g) of carbohydrate per serving.  Decide the number of servings of this food or beverage that you will be able to eat or drink. Multiply that number of servings by the number of grams of carbohydrate that is listed on the label for that serving. The total will be the amount of carbohydrates you will be having when you eat or drink this food or beverage. Learning Standard Serving Sizes of Food When you eat food that is not packaged or does not include "Nutrition Facts" on the label, you need to measure the servings in order to count the amount of carbohydrates.A serving of most carbohydrate-rich foods contains about 15 g of carbohydrates. The following list includes serving sizes of carbohydrate-rich foods that provide 15 g ofcarbohydrate per serving:   1 slice of bread (1 oz) or 1 six-inch tortilla.    of a hamburger bun or English muffin.  4-6 crackers.  cup unsweetened dry cereal.    cup hot cereal.   cup rice or pasta.    cup mashed potatoes or  of a large baked potato.  1 cup fresh fruit or one small piece of fruit.    cup canned or frozen fruit or fruit juice.  1 cup milk.   cup plain fat-free yogurt or yogurt sweetened with artificial sweeteners.   cup cooked dried beans or starchy vegetable, such as peas, corn, or potatoes.  Decide the number of standard-size servings that you will eat. Multiply that number of servings by 15 (the grams of carbohydrates in that serving). For example, if you eat 2 cups of strawberries, you will have eaten 2 servings and 30 g of  carbohydrates (2 servings x 15 g = 30 g). For foods such as soups and casseroles, in which more than one food is mixed in, you will need to count the carbohydrates in each food that is included. EXAMPLE OF CARBOHYDRATE COUNTING Sample Dinner  3 oz chicken breast.   cup of brown rice.   cup of corn.  1 cup milk.   1 cup strawberries with sugar-free whipped topping.  Carbohydrate Calculation Step 1: Identify the foods that contain carbohydrates:   Rice.   Corn.   Milk.   Strawberries. Step 2:Calculate the number of servings eaten of each:   2 servings of rice.   1 serving of corn.   1 serving of milk.   1 serving of strawberries. Step 3: Multiply each of those number of servings by 15 g:   2 servings of rice x 15 g = 30 g.   1 serving of corn x 15 g = 15 g.   1 serving of milk x 15 g = 15 g.   1 serving of strawberries x 15 g = 15 g. Step 4: Add together all of the amounts to find the total grams of carbohydrates eaten: 30 g + 15 g + 15 g + 15 g = 75 g.   This information is not intended to replace advice given to you by your health care provider. Make sure you discuss any questions you have with your health care provider.   Document Released: 03/07/2005 Document Revised: 03/28/2014 Document Reviewed: 02/01/2013 Elsevier Interactive Patient Education [employer].

## 2015-12-10 NOTE — Progress Notes (Signed)
Sandra Fritz  MRN: [id number] DOB: November 26, 1934  Subjective:  HPI  Patient is here for follow up Hypertension: BP Readings from Last 3 Encounters:  12/10/15 (!) 152/68  09/09/15 (!) 132/52  07/23/15 138/68   Hyperlipidemia: had her last level checked through [facility]. She is not on any medications for cholesterol, she tried all statins and had muscle/joint pain. On 09/30/15 TC 359 HDL 68 LDL 286 Trig 176 She states that she had an episode one night she woke up with her mouth opened wide and she had hard time to get her breath in. She ended up laying over the railing of her bed and kept pushing herself and a thick amount of phlegm came out that apparently was blocking off her air way. She has been coughing more than usually for the past 2 month or more.  She also states for the past 2 months at night time she will feel some chest pain across her chest and on the left side of her neck that lasted for a few minutes and will go away with walking.  She also has a wound on the lower right arm. She has been soaking her arm in clorox water for breaking out of her skin and one day she was in a hurry so she soaked her armand applied bandaid with antibiotic on it and at the end of the day after taking bandaid off noticed green pus coming out of the lesion, redness and swelling. No fever. Patient Active Problem List   Diagnosis Date Noted  . Absolute anemia 07/25/2014  . Anxiety 07/25/2014  . AB (asthmatic bronchitis) 07/25/2014  . 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 Cataract 07/25/2014  . Intervertebral cervical disc disorder with myelopathy, cervical region 07/25/2014  . Chest pain 07/25/2014  . Breath shortness 07/25/2014  . Edema leg 07/25/2014  . Fatigue 07/25/2014  . Acid reflux 07/25/2014  . Headache 07/25/2014  . 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 Arthralgia of hip 07/25/2014  . BP (high blood pressure) 07/25/2014  . HLD (hyperlipidemia) 07/25/2014  . Below  normal amount of sodium in the blood 07/25/2014  . Allergic state 07/25/2014  . Neuropathy ([place]) 07/25/2014  . Arthritis, degenerative 07/25/2014  . Panaritium of finger 07/25/2014  . Peptic ulcer 07/25/2014  . Hive 07/25/2014  . Head revolving around 07/25/2014  . FOOT, PAIN 10/12/2007  . Hyperlipidemia 09/18/2007  . Hypertension 09/18/2007  . PEPTIC ULCER DISEASE 09/18/2007  . PLANTAR FASCIITIS, LEFT 09/18/2007    No past medical history on file.  Social History   Social History  . Marital status: Widowed    Spouse name: N/A  . Number of children: N/A  . Years of education: N/A   Occupational History  . Not on file.   Social History Main Topics  . Smoking status: Never Smoker  . Smokeless tobacco: Never Used  . Alcohol use No     Comment: Maybe 3 per year  . Drug use: No  . Sexual activity: No   Other Topics Concern  . Not on file   Social History Narrative  . No narrative on file    Outpatient Encounter Prescriptions as of 12/10/2015  Medication Sig Note  . aspirin 81 MG tablet Take by mouth. 07/25/2014: Received from: [facility]  . esomeprazole (NEXIUM) 20 MG capsule Take 1 capsule by mouth daily. 04/23/2015: Alternating with zantac and tums in between  . furosemide (LASIX) 20 MG tablet Take 1 tablet (20 mg total) by mouth  2 (two) times daily.   Marland Kitchen losartan (COZAAR) 100 MG tablet TAKE 1 TABLET (100 MG TOTAL) BY MOUTH DAILY.   Marland Kitchen Misc Natural Products (OSTEO BI-FLEX JOINT SHIELD PO) Take 2 tablets by mouth daily.   . MULTIPLE VITAMIN PO Take by mouth. 07/25/2014: Received from: [facility]   No facility-administered encounter medications on file as of 12/10/2015.     Allergies  Allergen Reactions  . Erythromycin     ALL MYCINS  . Keflex  [Cephalexin]     hives    Review of Systems  Constitutional: Positive for malaise/fatigue.  HENT: Negative.   Eyes: Negative.   Respiratory: Positive for cough and sputum production.     Cardiovascular: Positive for chest pain.  Gastrointestinal: Negative.   Musculoskeletal: Positive for joint pain.  Skin: Negative.        lesion  Neurological: Negative.   Endo/Heme/Allergies: Negative.   Psychiatric/Behavioral: Negative.    Objective:  BP (!) 152/68   Pulse 80   Temp 98.1 F (36.7 C)   Resp 16   Wt 184 lb (83.5 kg)   BMI 27.17 kg/m   Physical Exam  Constitutional: She is oriented to person, place, and time and well-developed, well-nourished, and in no distress.  HENT:  Head: Normocephalic and atraumatic.  Right Ear: External ear normal.  Left Ear: External ear normal.  Nose: Nose normal.  Eyes: Conjunctivae are normal. Pupils are equal, round, and reactive to light.  Neck: Normal range of motion. Neck supple.  Cardiovascular: Normal rate, regular rhythm, normal heart sounds and intact distal pulses.   No murmur heard. Pulmonary/Chest: Effort normal and breath sounds normal. No respiratory distress.  Abdominal: Soft.  Musculoskeletal: She exhibits edema (trace).  Neurological: She is alert and oriented to person, place, and time.  Skin: There is erythema.  Psychiatric: Mood, memory, affect and judgment normal.    Assessment and Plan :  1. Essential hypertension Elevated today. Patient had stressful month so will wait before making any medication changes at this time.  2. HLD (hyperlipidemia) Does not tolerate statins. Can add Fish Oil. Follow.  3. Gastroesophageal reflux disease, esophagitis presence not specified I think symptoms of chest pain she has had is related to GERD. I do not think this is cardiac issue. Could be contributing to the cough. 4. Abrasion Lower right arm, I think this could be a reaction from the band aid. Applied Vaseline gauze on the area and advised patient to leave it on on Saturday and then change the bandage and take it off on Sunday and then leave it open to dry with air.  5. Cough Try Robitussin or Mucinex. Exam is  normal today. It is possible that patient has some lung disease/early COPD as etiology of her cough. If this persists we'll obtain spirometry or chest x-ray or pulmonary referral. 6. Hyperglycemia A1C 6.1 today. Pre diabetic. Advised patient work on eating habits, decrease carbs in her diet. Follow. 7. Chronic anxiety I think this is a chronic insignificant daily issue for patient. More than 50% of today's visit is spent in counseling.  HPI, Exam and A&P transcribed under direction and in the presence of Miguel Aschoff, MD. I have done the exam and reviewed the chart and it is accurate to the best of my knowledge. Miguel Aschoff M.D. [facility]

## 2016-01-14 ENCOUNTER — Ambulatory Visit (INDEPENDENT_AMBULATORY_CARE_PROVIDER_SITE_OTHER): Payer: PPO

## 2016-01-14 DIAGNOSIS — Z23 Encounter for immunization: Secondary | ICD-10-CM | POA: Diagnosis not present

## 2016-01-21 ENCOUNTER — Telehealth: Payer: Self-pay | Admitting: Family Medicine

## 2016-01-21 NOTE — Telephone Encounter (Signed)
error 

## 2016-04-12 ENCOUNTER — Ambulatory Visit (INDEPENDENT_AMBULATORY_CARE_PROVIDER_SITE_OTHER): Payer: PPO

## 2016-04-12 ENCOUNTER — Ambulatory Visit (INDEPENDENT_AMBULATORY_CARE_PROVIDER_SITE_OTHER): Payer: PPO | Admitting: Family Medicine

## 2016-04-12 VITALS — BP 156/72 | HR 80 | Temp 98.4°F | Ht 69.0 in | Wt 181.8 lb

## 2016-04-12 VITALS — BP 156/72 | HR 80 | Temp 98.4°F | Resp 16 | Wt 181.0 lb

## 2016-04-12 DIAGNOSIS — K219 Gastro-esophageal reflux disease without esophagitis: Secondary | ICD-10-CM

## 2016-04-12 DIAGNOSIS — J40 Bronchitis, not specified as acute or chronic: Secondary | ICD-10-CM | POA: Diagnosis not present

## 2016-04-12 DIAGNOSIS — Z23 Encounter for immunization: Secondary | ICD-10-CM

## 2016-04-12 DIAGNOSIS — H1033 Unspecified acute conjunctivitis, bilateral: Secondary | ICD-10-CM

## 2016-04-12 DIAGNOSIS — R079 Chest pain, unspecified: Secondary | ICD-10-CM | POA: Diagnosis not present

## 2016-04-12 DIAGNOSIS — D649 Anemia, unspecified: Secondary | ICD-10-CM

## 2016-04-12 DIAGNOSIS — R739 Hyperglycemia, unspecified: Secondary | ICD-10-CM | POA: Diagnosis not present

## 2016-04-12 DIAGNOSIS — I10 Essential (primary) hypertension: Secondary | ICD-10-CM | POA: Diagnosis not present

## 2016-04-12 DIAGNOSIS — R5383 Other fatigue: Secondary | ICD-10-CM

## 2016-04-12 DIAGNOSIS — E785 Hyperlipidemia, unspecified: Secondary | ICD-10-CM | POA: Diagnosis not present

## 2016-04-12 DIAGNOSIS — Z0000 Encounter for general adult medical examination without abnormal findings: Secondary | ICD-10-CM

## 2016-04-12 MED ORDER — CIPROFLOXACIN HCL 0.3 % OP SOLN: 1.0000 [drp] | OPHTHALMIC | 0 refills | Status: DC

## 2016-04-12 MED ORDER — CIPROFLOXACIN HCL 0.3 % OP SOLN
1.0000 [drp] | OPHTHALMIC | 0 refills | Status: DC
Start: 2016-04-12 — End: 2016-04-12

## 2016-04-12 MED ORDER — SUCRALFATE 1 G PO TABS: 1.0000 g | ORAL_TABLET | Freq: Three times a day (TID) | ORAL | 12 refills | Status: DC

## 2016-04-12 MED ORDER — LEVOFLOXACIN 500 MG PO TABS: 500.0000 mg | ORAL_TABLET | Freq: Every day | ORAL | 0 refills | Status: DC

## 2016-04-12 NOTE — Progress Notes (Signed)
Sandra Fritz  MRN: [id number] DOB: [birth year]  Subjective:  HPI   1. Essential hypertension The patient is a 82 year old female who presents for follow up of her hypertension.  She was last seen on 12/01/15 and her BP at that time was 152/68.  She occasionally checks her blood pressure outside of the office and gets readings 130-140's over 80s.  She is tolerating her medication well.  2. Hyperglycemia Patient is due to have her A1C checked today.  Her last one was done on 12/10/15 and was 6.1.  She does not check her glucose at home and reports that she has not had any symptoms suggestive of hypoglycemia.  3. Anemia, unspecified type Patient has a long history of anemia and is due to have her CBC checked.  She reports having decreased energy more often but denies any shortness of breath.    4. Other fatigue Patient reports increased fatigue recently.  5. Hyperlipidemia, unspecified hyperlipidemia type Patient is on cholesterol medication and is due to have her lipids checked.    COLD SYMPTOMS-Patient is complaining of some head and chest congestion,  She states that when she blows her nose she had blood in the mucus.  She is coughing up green mucus and states she gets a little short of breath when she walks a lot.  She states she has been having watery eyes and woke up with her eyes swollen and the right eye swollen shut.  GERD-the patient reports that she has changed from prescription Prilosec to OTC Prilosec.  She states that it has controlled her symptoms well.  However she then said that she gets pressure and pain in her epigastgastric area that goes up her chest and radiates outward bilaterally.  She said hat the pain is worse on the right side but she does get it on both sides.  It radiates up her neck and into her shoulder on the right side.   Patient Active Problem List   Diagnosis Date Noted  . Absolute anemia 07/25/2014  . Anxiety 07/25/2014  . AB (asthmatic bronchitis)  07/25/2014  . 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 Cataract 07/25/2014  . Intervertebral cervical disc disorder with myelopathy, cervical region 07/25/2014  . Chest pain 07/25/2014  . Breath shortness 07/25/2014  . Edema leg 07/25/2014  . Fatigue 07/25/2014  . Acid reflux 07/25/2014  . Headache 07/25/2014  . 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 Arthralgia of hip 07/25/2014  . BP (high blood pressure) 07/25/2014  . HLD (hyperlipidemia) 07/25/2014  . Below normal amount of sodium in the blood 07/25/2014  . Allergic state 07/25/2014  . Neuropathy ([place]) 07/25/2014  . Arthritis, degenerative 07/25/2014  . Panaritium of finger 07/25/2014  . Peptic ulcer 07/25/2014  . Hive 07/25/2014  . Head revolving around 07/25/2014  . FOOT, PAIN 10/12/2007  . Hyperlipidemia 09/18/2007  . Hypertension 09/18/2007  . PEPTIC ULCER DISEASE 09/18/2007  . PLANTAR FASCIITIS, LEFT 09/18/2007    Past Medical History:  Diagnosis Date  . Anemia   . Anxiety   . Hyperlipidemia   . Hypertension     Social History   Social History  . Marital status: Widowed    Spouse name: N/A  . Number of children: N/A  . Years of education: N/A   Occupational History  . Not on file.   Social History Main Topics  . Smoking status: Never Smoker  . Smokeless tobacco: Never Used  . Alcohol use No     Comment: Maybe  3 per year  . Drug use: No  . Sexual activity: No   Other Topics Concern  . Not on file   Social History Narrative  . No narrative on file    Outpatient Encounter Prescriptions as of 04/12/2016  Medication Sig Note  . aspirin 81 MG tablet Take by mouth. 07/25/2014: Received from: [facility]  . Chlorpheniramine-DM (CORICIDIN HBP COUGH/COLD PO) Take by mouth.   . esomeprazole (NEXIUM) 20 MG capsule Take 1 capsule by mouth daily. 04/23/2015: Alternating with zantac and tums in between  . furosemide (LASIX) 20 MG tablet Take 1 tablet (20 mg total) by mouth 2 (two) times  daily.   Marland Kitchen loratadine (CLARITIN) 10 MG tablet Take 10 mg by mouth daily.   Marland Kitchen losartan (COZAAR) 100 MG tablet TAKE 1 TABLET (100 MG TOTAL) BY MOUTH DAILY.   Marland Kitchen Misc Natural Products (OSTEO BI-FLEX JOINT SHIELD PO) Take 2 tablets by mouth daily.   . Omega-3 Fatty Acids (FISH OIL) 1200 MG CAPS 1 daily   . [DISCONTINUED] MULTIPLE VITAMIN PO Take by mouth. 07/25/2014: Received from: [facility]   No facility-administered encounter medications on file as of 04/12/2016.     Allergies  Allergen Reactions  . Erythromycin     ALL MYCINS  . Keflex  [Cephalexin]     hives  . Statins     Muscle and joint pain-patient states she tried all statins.    Review of Systems  Constitutional: Positive for malaise/fatigue. Negative for chills, diaphoresis, fever and weight loss.  HENT: Positive for congestion and sinus pain. Negative for ear discharge, ear pain, hearing loss, nosebleeds, sore throat and tinnitus.   Eyes: Positive for discharge (swelling). Negative for blurred vision, double vision and photophobia.  Respiratory: Positive for cough, sputum production and shortness of breath (only with exertion). Negative for hemoptysis and wheezing.   Cardiovascular: Positive for chest pain and leg swelling. Negative for palpitations, orthopnea and claudication.  Gastrointestinal: Negative for abdominal pain, blood in stool, constipation, diarrhea, heartburn, melena, nausea and vomiting.  Neurological: Negative for dizziness, weakness and headaches.  Endo/Heme/Allergies: Does not bruise/bleed easily.  Psychiatric/Behavioral: Negative.     Objective:  BP (!) 156/72   Pulse 80   Temp 98.4 F (36.9 C) (Oral)   Resp 16   Wt 181 lb (82.1 kg)   BMI 26.73 kg/m   Physical Exam  Constitutional: She is oriented to person, place, and time and well-developed, well-nourished, and in no distress.  HENT:  Head: Normocephalic and atraumatic.  Right Ear: External ear normal.  Left Ear: External ear  normal.  Nose: Nose normal.  Eyes: Conjunctivae are normal. Pupils are equal, round, and reactive to light. No scleral icterus.  Neck: Normal range of motion. No thyromegaly present.  Cardiovascular: Normal rate, regular rhythm and normal heart sounds.   Pulmonary/Chest: Effort normal and breath sounds normal.  Abdominal: Soft.  Neurological: She is alert and oriented to person, place, and time. Gait normal. GCS score is 15.  Skin: Skin is warm and dry.  Fair skinned  Psychiatric: Mood, memory, affect and judgment normal.    Assessment and Plan :   1. Essential hypertension  - Comprehensive metabolic panel  2. Hyperglycemia  - Hemoglobin A1c  3. Anemia, unspecified type  - CBC with Differential/Platelet  4. Other fatigue  - Comprehensive metabolic panel - CBC with Differential/Platelet - TSH  5. Hyperlipidemia, unspecified hyperlipidemia type  - Hemoglobin A1c - Lipid Panel With LDL/HDL Ratio  6.  Chest pain, unspecified type  - EKG 12-Lead  7. Bronchitis  - levofloxacin (LEVAQUIN) 500 MG tablet; Take 1 tablet (500 mg total) by mouth daily.  Dispense: 7 tablet; Refill: 0  8. Acute conjunctivitis of both eyes, unspecified acute conjunctivitis type  - ciprofloxacin (CILOXAN) 0.3 % ophthalmic solution; Place 1 drop into both eyes every 2 (two) hours. Administer 1 drop, every 2 hours, while awake, for 2 days. Then 1 drop, every 4 hours, while awake, for the next 5 days.  Dispense: 5 mL; Refill: 0  9. Gastroesophageal reflux disease, esophagitis presence not specified  - sucralfate (CARAFATE) 1 g tablet; Take 1 tablet (1 g total) by mouth 4 (four) times daily -  with meals and at bedtime.  Dispense: 120 tablet; Refill: 12     HPI, Exam and A&P Transcribed under the direction and in the presence of Miguel Aschoff, Brooke Bonito., MD. Electronically Signed: Althea Charon, RMA I have done the exam and reviewed the chart and it is accurate to the best of my knowledge. Risk manager has been used and  any errors in dictation or transcription are unintentional. Miguel Aschoff M.D. [facility]

## 2016-04-12 NOTE — Patient Instructions (Signed)
Health Maintenance, Female Introduction Adopting a healthy lifestyle and getting preventive care can go a long way to promote health and wellness. Talk with your health care provider about what schedule of regular examinations is right for you. This is a good chance for you to check in with your provider about disease prevention and staying healthy. In between checkups, there are plenty of things you can do on your own. Experts have done a lot of research about which lifestyle changes and preventive measures are most likely to keep you healthy. Ask your health care provider for more information. Weight and diet Eat a healthy diet  Be sure to include plenty of vegetables, fruits, low-fat dairy products, and lean protein.  Do not eat a lot of foods high in solid fats, added sugars, or salt.  Get regular exercise. This is one of the most important things you can do for your health.  Most adults should exercise for at least 150 minutes each week. The exercise should increase your heart rate and make you sweat (moderate-intensity exercise).  Most adults should also do strengthening exercises at least twice a week. This is in addition to the moderate-intensity exercise. Maintain a healthy weight  Body mass index (BMI) is a measurement that can be used to identify possible weight problems. It estimates body fat based on height and weight. Your health care provider can help determine your BMI and help you achieve or maintain a healthy weight.  For females 4 years of age and older:  A BMI below 18.5 is considered underweight.  A BMI of 18.5 to 24.9 is normal.  A BMI of 25 to 29.9 is considered overweight.  A BMI of 30 and above is considered obese. Watch levels of cholesterol and blood lipids  You should start having your blood tested for lipids and cholesterol at 80 years of age, then have this test every 5 years.  You may need to have your cholesterol levels checked more often  if:  Your lipid or cholesterol levels are high.  You are older than 80 years of age.  You are at high risk for heart disease. Cancer screening Lung Cancer  Lung cancer screening is recommended for adults 12-31 years old who are at high risk for lung cancer because of a history of smoking.  A yearly low-dose CT scan of the lungs is recommended for people who:  Currently smoke.  Have quit within the past 15 years.  Have at least a 30-pack-year history of smoking. A pack year is smoking an average of one pack of cigarettes a day for 1 year.  Yearly screening should continue until it has been 15 years since you quit.  Yearly screening should stop if you develop a health problem that would prevent you from having lung cancer treatment. Breast Cancer  Practice breast self-awareness. This means understanding how your breasts normally appear and feel.  It also means doing regular breast self-exams. Let your health care provider know about any changes, no matter how small.  If you are in your 20s or 30s, you should have a clinical breast exam (CBE) by a health care provider every 1-3 years as part of a regular health exam.  If you are 74 or older, have a CBE every year. Also consider having a breast X-ray (mammogram) every year.  If you have a family history of breast cancer, talk to your health care provider about genetic screening.  If you are at high risk for breast cancer,  talk to your health care provider about having an MRI and a mammogram every year.  Breast cancer gene (BRCA) assessment is recommended for women who have family members with BRCA-related cancers. BRCA-related cancers include:  Breast.  Ovarian.  Tubal.  Peritoneal cancers.  Results of the assessment will determine the need for genetic counseling and BRCA1 and BRCA2 testing. Colorectal Cancer  This type of cancer can be detected and often prevented.  Routine colorectal cancer screening usually begins  at 80 years of age and continues through 80 years of age.  Your health care provider may recommend screening at an earlier age if you have risk factors for colon cancer.  Your health care provider may also recommend using home test kits to check for hidden blood in the stool.  A small camera at the end of a tube can be used to examine your colon directly (sigmoidoscopy or colonoscopy). This is done to check for the earliest forms of colorectal cancer.  Routine screening usually begins at age 50.  Direct examination of the colon should be repeated every 5-10 years through 80 years of age. However, you may need to be screened more often if early forms of precancerous polyps or small growths are found. Skin Cancer  Check your skin from head to toe regularly.  Tell your health care provider about any new moles or changes in moles, especially if there is a change in a mole's shape or color.  Also tell your health care provider if you have a mole that is larger than the size of a pencil eraser.  Always use sunscreen. Apply sunscreen liberally and repeatedly throughout the day.  Protect yourself by wearing long sleeves, pants, a wide-brimmed hat, and sunglasses whenever you are outside. Heart disease, diabetes, and high blood pressure  High blood pressure causes heart disease and increases the risk of stroke. High blood pressure is more likely to develop in:  People who have blood pressure in the high end of the normal range (130-139/85-89 mm Hg).  People who are overweight or obese.  People who are African American.  If you are 18-39 years of age, have your blood pressure checked every 3-5 years. If you are 40 years of age or older, have your blood pressure checked every year. You should have your blood pressure measured twice-once when you are at a hospital or clinic, and once when you are not at a hospital or clinic. Record the average of the two measurements. To check your blood pressure  when you are not at a hospital or clinic, you can use:  An automated blood pressure machine at a pharmacy.  A home blood pressure monitor.  If you are between 55 years and 79 years old, ask your health care provider if you should take aspirin to prevent strokes.  Have regular diabetes screenings. This involves taking a blood sample to check your fasting blood sugar level.  If you are at a normal weight and have a low risk for diabetes, have this test once every three years after 80 years of age.  If you are overweight and have a high risk for diabetes, consider being tested at a younger age or more often. Preventing infection Hepatitis B  If you have a higher risk for hepatitis B, you should be screened for this virus. You are considered at high risk for hepatitis B if:  You were born in a country where hepatitis B is common. Ask your health care provider which countries are   considered high risk.  Your parents were born in a high-risk country, and you have not been immunized against hepatitis B (hepatitis B vaccine).  You have HIV or AIDS.  You use needles to inject street drugs.  You live with someone who has hepatitis B.  You have had sex with someone who has hepatitis B.  You get hemodialysis treatment.  You take certain medicines for conditions, including cancer, organ transplantation, and autoimmune conditions. Hepatitis C  Blood testing is recommended for:  Everyone born from 1945 through 1965.  Anyone with known risk factors for hepatitis C. Osteoporosis and menopause  Osteoporosis is a disease in which the bones lose minerals and strength with aging. This can result in serious bone fractures. Your risk for osteoporosis can be identified using a bone density scan.  If you are 65 years of age or older, or if you are at risk for osteoporosis and fractures, ask your health care provider if you should be screened.  Ask your health care provider whether you should take  a calcium or vitamin D supplement to lower your risk for osteoporosis.  Menopause may have certain physical symptoms and risks.  Hormone replacement therapy may reduce some of these symptoms and risks. Talk to your health care provider about whether hormone replacement therapy is right for you. Follow these instructions at home:  Schedule regular health, dental, and eye exams.  Stay current with your immunizations.  Do not use any tobacco products including cigarettes, chewing tobacco, or electronic cigarettes.  If you are pregnant, do not drink alcohol.  If you are breastfeeding, limit how much and how often you drink alcohol.  Limit alcohol intake to no more than 1 drink per day for nonpregnant women. One drink equals 12 ounces of beer, 5 ounces of wine, or 1 ounces of hard liquor.  Do not use street drugs.  Do not share needles.  Ask your health care provider for help if you need support or information about quitting drugs.  Tell your health care provider if you often feel depressed.  Tell your health care provider if you have ever been abused or do not feel safe at home. This information is not intended to replace advice given to you by your health care provider. Make sure you discuss any questions you have with your health care provider. Document Released: 09/20/2010 Document Revised: 08/13/2015 Document Reviewed: 12/09/2014  2017 Elsevier  

## 2016-04-12 NOTE — Progress Notes (Signed)
Subjective:   Sandra Fritz is a 82 y.o. female who presents for Medicare Annual (Subsequent) preventive examination.  Review of Systems:  N/A  Cardiac Risk Factors include: advanced age (>57men, >25 women);hypertension;dyslipidemia     Objective:     Vitals: BP (!) 156/72 (BP Location: Right Arm)   Pulse 80   Temp 98.4 F (36.9 C) (Oral)   Ht 5\' 9"  (1.753 m)   Wt 181 lb 12.8 oz (82.5 kg)   BMI 26.85 kg/m   Body mass index is 26.85 kg/m.   Tobacco History  Smoking Status  . Never Smoker  Smokeless Tobacco  . Never Used     Counseling given: Not Answered   Past Medical History:  Diagnosis Date  . Anemia   . Anxiety   . Hyperlipidemia   . Hypertension    Past Surgical History:  Procedure Laterality Date  . APPENDECTOMY  1955   SECONDARY TO RUTURED APPENDIX  . CATARACT EXTRACTION, BILATERAL  2011  . DILATION AND CURETTAGE OF UTERUS  1989   Dr. Laurey Morale  . JOINT REPLACEMENT     Right hip  . MOLE REMOVAL  1957   18 removed by Dr. Hoyle Sauer  . NECK SURGERY    . TONSILLECTOMY  1955   Family History  Problem Relation Age of Onset  . Macular degeneration Mother   . Hypertension Mother   . Diabetes Father   . Heart disease Father   . Heart attack Father 45  . Heart disease Brother 72  . Dermatomyositis Sister   . Cancer Sister     Carcinoma of the uterine call attached to colon and is in remission now   History  Sexual Activity  . Sexual activity: No    Outpatient Encounter Prescriptions as of 04/12/2016  Medication Sig  . aspirin 81 MG tablet Take by mouth.  . Chlorpheniramine-DM (CORICIDIN HBP COUGH/COLD PO) Take by mouth.  . esomeprazole (NEXIUM) 20 MG capsule Take 1 capsule by mouth daily.  . furosemide (LASIX) 20 MG tablet Take 1 tablet (20 mg total) by mouth 2 (two) times daily.  Marland Kitchen loratadine (CLARITIN) 10 MG tablet Take 10 mg by mouth daily.  Marland Kitchen losartan (COZAAR) 100 MG tablet TAKE 1 TABLET (100 MG TOTAL) BY MOUTH DAILY.  Marland Kitchen Misc Natural  Products (OSTEO BI-FLEX JOINT SHIELD PO) Take 2 tablets by mouth daily.  . MULTIPLE VITAMIN PO Take by mouth.  . Omega-3 Fatty Acids (FISH OIL) 1200 MG CAPS 1 daily (Patient not taking: Reported on 04/12/2016)   No facility-administered encounter medications on file as of 04/12/2016.     Activities of Daily Living In your present state of health, do you have any difficulty performing the following activities: 04/12/2016  Hearing? N  Vision? Y  Difficulty concentrating or making decisions? Y  Walking or climbing stairs? Y  Dressing or bathing? N  Doing errands, shopping? N  Preparing Food and eating ? N  Using the Toilet? N  In the past six months, have you accidently leaked urine? Y  Do you have problems with loss of bowel control? N  Managing your Medications? N  Managing your Finances? N  Housekeeping or managing your Housekeeping? N  Some recent data might be hidden    Patient Care Team: Jerrol Banana., MD as PCP - General (Unknown Physician Specialty)    Assessment:     Exercise Activities and Dietary recommendations Current Exercise Habits: Structured exercise class, Type of exercise: strength training/weights;stretching;Other -  see comments (bicycle), Time (Minutes): 30, Frequency (Times/Week): 3, Weekly Exercise (Minutes/Week): 90, Intensity: Mild  Goals    . Increase water intake          Starting 04/12/16, I will increase my water intake to 6 glasses a day.      Fall Risk Fall Risk  04/12/2016 04/12/2016 12/23/2014  Falls in the past year? No No No   Depression Screen PHQ 2/9 Scores 04/12/2016 12/23/2014  PHQ - 2 Score 1 0     Cognitive Function     6CIT Screen 04/12/2016  What Year? 0 points  What month? 0 points  What time? 0 points  Count back from 20 0 points  Months in reverse 0 points  Repeat phrase 4 points  Total Score 4    Immunization History  Administered Date(s) Administered  . Influenza, High Dose Seasonal PF 12/23/2014, 01/14/2016    . Pneumococcal Conjugate-13 02/25/2014  . Pneumococcal Polysaccharide-23 04/12/2016  . Td 06/17/2004  . Zoster 04/14/2011   Screening Tests Health Maintenance  Topic Date Due  . TETANUS/TDAP  03/21/2017 (Originally 06/18/2014)  . INFLUENZA VACCINE  Completed  . DEXA SCAN  Completed  . ZOSTAVAX  Completed  . PNA vac Low Risk Adult  Completed      Plan:  I have personally reviewed and addressed the Medicare Annual Wellness questionnaire and have noted the following in the patient's chart:  A. Medical and social history B. Use of alcohol, tobacco or illicit drugs  C. Current medications and supplements D. Functional ability and status E.  Nutritional status F.  Physical activity G. Advance directives H. List of other physicians I.  Hospitalizations, surgeries, and ER visits in previous 12 months J.  [employer] such as hearing and vision if needed, cognitive and depression L. Referrals and appointments - none  In addition, I have reviewed and discussed with patient certain preventive protocols, quality metrics, and best practice recommendations. A written personalized care plan for preventive services as well as general preventive health recommendations were provided to patient.  See attached scanned questionnaire for additional information.   Signed,  Fabio Neighbors, LPN Nurse Health Advisor   MD Recommendations: Follow up on tetanus shot after pt checks with insurance for cost.  I have reviewed the health advisors note, was  available for consultation and I agree with documentation and plan. Miguel Aschoff MD [facility]

## 2016-04-13 LAB — LIPID PANEL WITH LDL/HDL RATIO
Cholesterol, Total: 294 mg/dL — ABNORMAL HIGH (ref 100–199)
HDL: 69 mg/dL (ref >39)
LDL CALC: 206 mg/dL — AB (ref 0–99)
LDl/HDL Ratio: 3 ratio units (ref 0.0–3.2)
TRIGLYCERIDES: 93 mg/dL (ref 0–149)
VLDL CHOLESTEROL CAL: 19 mg/dL (ref 5–40)

## 2016-04-13 LAB — CBC WITH DIFFERENTIAL/PLATELET
BASOS: 1 %
Basophils Absolute: 0 10*3/uL (ref 0.0–0.2)
EOS (ABSOLUTE): 0.1 10*3/uL (ref 0.0–0.4)
EOS: 2 %
HEMATOCRIT: 35 % (ref 34.0–46.6)
Hemoglobin: 11.4 g/dL (ref 11.1–15.9)
IMMATURE GRANS (ABS): 0 10*3/uL (ref 0.0–0.1)
Immature Granulocytes: 0 %
LYMPHS: 41 %
Lymphocytes Absolute: 2.7 10*3/uL (ref 0.7–3.1)
MCH: 27.1 pg (ref 26.6–33.0)
MCHC: 32.6 g/dL (ref 31.5–35.7)
MCV: 83 fL (ref 79–97)
Monocytes Absolute: 0.5 10*3/uL (ref 0.1–0.9)
Monocytes: 7 %
NEUTROS ABS: 3.2 10*3/uL (ref 1.4–7.0)
Neutrophils: 49 %
Platelets: 401 10*3/uL — ABNORMAL HIGH (ref 150–379)
RBC: 4.2 x10E6/uL (ref 3.77–5.28)
RDW: 14.7 % (ref 12.3–15.4)
WBC: 6.6 10*3/uL (ref 3.4–10.8)

## 2016-04-13 LAB — COMPREHENSIVE METABOLIC PANEL
ALBUMIN: 4.2 g/dL (ref 3.5–4.8)
ALK PHOS: 77 IU/L (ref 39–117)
ALT: 19 IU/L (ref 0–32)
AST: 21 IU/L (ref 0–40)
Albumin/Globulin Ratio: 1.5 (ref 1.2–2.2)
BILIRUBIN TOTAL: 0.3 mg/dL (ref 0.0–1.2)
BUN/Creatinine Ratio: 13 (ref 12–28)
BUN: 14 mg/dL (ref 8–27)
CO2: 27 mmol/L (ref 18–29)
CREATININE: 1.12 mg/dL — AB (ref 0.57–1.00)
Calcium: 10.1 mg/dL (ref 8.7–10.3)
Chloride: 102 mmol/L (ref 96–106)
GFR, EST AFRICAN AMERICAN: 54 mL/min/{1.73_m2} — AB (ref >59)
GFR, EST NON AFRICAN AMERICAN: 47 mL/min/{1.73_m2} — AB (ref >59)
GLUCOSE: 101 mg/dL — AB (ref 65–99)
Globulin, Total: 2.8 g/dL (ref 1.5–4.5)
Potassium: 4.5 mmol/L (ref 3.5–5.2)
SODIUM: 144 mmol/L (ref 134–144)
Total Protein: 7 g/dL (ref 6.0–8.5)

## 2016-04-13 LAB — HEMOGLOBIN A1C
Est. average glucose Bld gHb Est-mCnc: 128 mg/dL
Hgb A1c MFr Bld: 6.1 % — ABNORMAL HIGH (ref 4.8–5.6)

## 2016-04-13 LAB — TSH: TSH: 0.719 u[IU]/mL (ref 0.450–4.500)

## 2016-04-14 ENCOUNTER — Other Ambulatory Visit: Payer: Self-pay | Admitting: Family Medicine

## 2016-04-15 ENCOUNTER — Telehealth: Payer: Self-pay

## 2016-04-15 NOTE — Telephone Encounter (Signed)
-----   Message from Jerrol Banana., MD sent at 04/14/2016  8:08 AM EST ----- Labs okay but cholesterol too high. is patient able to take statin?

## 2016-04-15 NOTE — Telephone Encounter (Signed)
Pt advised, pt tried all statins and had bad pain with it. What other option does she have to try? She states that she has been working on her eating habits and eating better. Please review. Patient is worried about this-aa

## 2016-04-16 NOTE — Telephone Encounter (Signed)
Try Crestor 5 mg a every other day

## 2016-04-17 NOTE — Telephone Encounter (Signed)
Which she try Crestor 5 mg every other day?

## 2016-04-18 MED ORDER — ROSUVASTATIN CALCIUM 5 MG PO TABS: 5.0000 mg | ORAL_TABLET | ORAL | 3 refills | Status: DC

## 2016-04-18 NOTE — Telephone Encounter (Signed)
Pt agrees to start medication. Sent to [facility]. Renaldo Fiddler, CMA

## 2016-05-10 ENCOUNTER — Ambulatory Visit (INDEPENDENT_AMBULATORY_CARE_PROVIDER_SITE_OTHER): Payer: PPO | Admitting: Family Medicine

## 2016-05-10 ENCOUNTER — Encounter: Payer: Self-pay | Admitting: Family Medicine

## 2016-05-10 VITALS — BP 158/74 | HR 72 | Temp 98.0°F | Resp 16 | Wt 179.0 lb

## 2016-05-10 DIAGNOSIS — I10 Essential (primary) hypertension: Secondary | ICD-10-CM | POA: Diagnosis not present

## 2016-05-10 DIAGNOSIS — E785 Hyperlipidemia, unspecified: Secondary | ICD-10-CM

## 2016-05-10 DIAGNOSIS — J40 Bronchitis, not specified as acute or chronic: Secondary | ICD-10-CM | POA: Diagnosis not present

## 2016-05-10 MED ORDER — LOSARTAN POTASSIUM-HCTZ 100-12.5 MG PO TABS: 1.0000 | ORAL_TABLET | Freq: Every day | ORAL | 12 refills | Status: DC

## 2016-05-10 NOTE — Progress Notes (Signed)
Subjective:  HPI Pt is here for a 4 week follow up. She reports that she is feeling better. Her cough/congestion is better. She is still coughing during the night. Her eyes are better however they still water but not as bad as before. She is taking her Carafate and is feeling better with her chest pain. She has hard time getting the pills in during the day and hard time swallowing them but she is feeling better on the medication. She reports that she is also doing well on the Crestor and taking it every other day.    Prior to Admission medications   Medication Sig Start Date End Date Taking? Authorizing Provider  aspirin 81 MG tablet Take by mouth. 11/13/13   Historical Provider, MD  Chlorpheniramine-DM (CORICIDIN HBP COUGH/COLD PO) Take by mouth.    Historical Provider, MD  ciprofloxacin (CILOXAN) 0.3 % ophthalmic solution Place 1 drop into both eyes See admin instructions. 1 drop ea eye q 2 h x 2 days, the 1 drop ea eye q 4 h x 5 days 04/12/16   Jerrol Banana., MD  esomeprazole (NEXIUM) 20 MG capsule Take 1 capsule by mouth daily.    Historical Provider, MD  furosemide (LASIX) 20 MG tablet TAKE 1 TABLET (20 MG TOTAL) BY MOUTH 2 (TWO) TIMES DAILY. 04/14/16   Richard Maceo Pro., MD  levofloxacin (LEVAQUIN) 500 MG tablet Take 1 tablet (500 mg total) by mouth daily. 04/12/16   Richard Maceo Pro., MD  loratadine (CLARITIN) 10 MG tablet Take 10 mg by mouth daily.    Historical Provider, MD  losartan (COZAAR) 100 MG tablet TAKE 1 TABLET (100 MG TOTAL) BY MOUTH DAILY. 11/16/15   Richard Maceo Pro., MD  Misc Natural Products (OSTEO BI-FLEX JOINT SHIELD PO) Take 2 tablets by mouth daily.    Historical Provider, MD  Omega-3 Fatty Acids (FISH OIL) 1200 MG CAPS 1 daily 12/10/15   Jerrol Banana., MD  rosuvastatin (CRESTOR) 5 MG tablet Take 1 tablet (5 mg total) by mouth every other day. 04/18/16   Richard Maceo Pro., MD  sucralfate (CARAFATE) 1 g tablet Take 1 tablet (1 g total) by  mouth 4 (four) times daily -  with meals and at bedtime. 04/12/16   Richard Maceo Pro., MD    Patient Active Problem List   Diagnosis Date Noted  . Hyperglycemia 04/12/2016  . Absolute anemia 07/25/2014  . Anxiety 07/25/2014  . AB (asthmatic bronchitis) 07/25/2014  . 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 Cataract 07/25/2014  . Intervertebral cervical disc disorder with myelopathy, cervical region 07/25/2014  . Chest pain 07/25/2014  . Breath shortness 07/25/2014  . Edema leg 07/25/2014  . Fatigue 07/25/2014  . Acid reflux 07/25/2014  . Headache 07/25/2014  . 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 Arthralgia of hip 07/25/2014  . BP (high blood pressure) 07/25/2014  . HLD (hyperlipidemia) 07/25/2014  . Below normal amount of sodium in the blood 07/25/2014  . Allergic state 07/25/2014  . Neuropathy ([place]) 07/25/2014  . Arthritis, degenerative 07/25/2014  . Panaritium of finger 07/25/2014  . Peptic ulcer 07/25/2014  . Hive 07/25/2014  . Head revolving around 07/25/2014  . FOOT, PAIN 10/12/2007  . Hyperlipidemia 09/18/2007  . Hypertension 09/18/2007  . PEPTIC ULCER DISEASE 09/18/2007  . PLANTAR FASCIITIS, LEFT 09/18/2007    Past Medical History:  Diagnosis Date  . Anemia   . Anxiety   . Hyperlipidemia   . Hypertension  Social History   Social History  . Marital status: Widowed    Spouse name: N/A  . Number of children: N/A  . Years of education: N/A   Occupational History  . Not on file.   Social History Main Topics  . Smoking status: Never Smoker  . Smokeless tobacco: Never Used  . Alcohol use No     Comment: Maybe 3 per year  . Drug use: No  . Sexual activity: No   Other Topics Concern  . Not on file   Social History Narrative  . No narrative on file    Allergies  Allergen Reactions  . Erythromycin     ALL MYCINS  . Keflex  [Cephalexin]     hives  . Statins     Muscle and joint pain-patient states she tried all statins.     Review of Systems  Constitutional: Negative.   HENT: Negative.   Eyes: Positive for discharge (watering).  Respiratory: Positive for cough.   Cardiovascular: Positive for chest pain (improved with Carafate).  Gastrointestinal: Negative.   Genitourinary: Negative.   Musculoskeletal: Negative.   Skin: Negative.   Neurological: Negative.   Endo/Heme/Allergies: Negative.   Psychiatric/Behavioral: Negative.     Immunization History  Administered Date(s) Administered  . Influenza, High Dose Seasonal PF 12/23/2014, 01/14/2016  . Pneumococcal Conjugate-13 02/25/2014  . Pneumococcal Polysaccharide-23 04/12/2016  . Td 06/17/2004  . Zoster 04/14/2011    Objective:  BP (!) 158/74 (BP Location: Right Arm, Patient Position: Sitting, Cuff Size: Normal)   Pulse 72   Temp 98 F (36.7 C) (Oral)   Resp 16   Wt 179 lb (81.2 kg)   SpO2 98%   BMI 26.43 kg/m   Physical Exam  Constitutional: She is oriented to person, place, and time and well-developed, well-nourished, and in no distress.  HENT:  Head: Normocephalic and atraumatic.  Right Ear: External ear normal.  Left Ear: External ear normal.  Nose: Nose normal.  Eyes: Conjunctivae are normal. No scleral icterus.  Neck: Neck supple. No thyromegaly present.  Cardiovascular: Normal rate, regular rhythm and normal heart sounds.   Pulmonary/Chest: Effort normal and breath sounds normal.  Abdominal: Soft.  Neurological: She is alert and oriented to person, place, and time. Gait normal. GCS score is 15.  Skin: Skin is warm and dry.  Psychiatric: Mood, memory, affect and judgment normal.    Lab Results  Component Value Date   WBC 6.6 04/12/2016   HGB 11.5 (A) 02/05/2014   HCT 35.0 04/12/2016   PLT 401 (H) 04/12/2016   GLUCOSE 101 (H) 04/12/2016   CHOL 294 (H) 04/12/2016   TRIG 93 04/12/2016   HDL 69 04/12/2016   LDLCALC 206 (H) 04/12/2016   TSH 0.719 04/12/2016   INR 1.0 10/02/2013   HGBA1C 6.1 (H) 04/12/2016    CMP      Component Value Date/Time   NA 144 04/12/2016 1608   NA 136 10/29/2013 0645   K 4.5 04/12/2016 1608   K 3.7 10/29/2013 0645   CL 102 04/12/2016 1608   CL 99 10/29/2013 0645   CO2 27 04/12/2016 1608   CO2 32 10/29/2013 0645   GLUCOSE 101 (H) 04/12/2016 1608   GLUCOSE 97 10/29/2013 0645   BUN 14 04/12/2016 1608   BUN 14 10/29/2013 0645   CREATININE 1.12 (H) 04/12/2016 1608   CREATININE 1.41 (H) 10/29/2013 0645   CALCIUM 10.1 04/12/2016 1608   CALCIUM 10.0 10/29/2013 0645   PROT 7.0 04/12/2016 1608  PROT 6.4 10/11/2013 1355   ALBUMIN 4.2 04/12/2016 1608   ALBUMIN 2.4 (L) 10/11/2013 1355   AST 21 04/12/2016 1608   AST 43 (H) 10/11/2013 1355   ALT 19 04/12/2016 1608   ALT 33 10/11/2013 1355   ALKPHOS 77 04/12/2016 1608   ALKPHOS 85 10/11/2013 1355   BILITOT 0.3 04/12/2016 1608   BILITOT 0.6 10/11/2013 1355   GFRNONAA 47 (L) 04/12/2016 1608   GFRNONAA 36 (L) 10/29/2013 0645   GFRAA 54 (L) 04/12/2016 1608   GFRAA 42 (L) 10/29/2013 0645    Assessment and Plan :   1. Essential hypertension Add HCTZ. - losartan-hydrochlorothiazide (HYZAAR) 100-12.5 MG tablet; Take 1 tablet by mouth daily.  Dispense: 30 tablet; Refill: 12  2. Bronchitis   3. Hyperlipidemia, unspecified hyperlipidemia type  I have done the exam and reviewed the above chart and it is accurate to the best of my knowledge. Development worker, community has been used in this note in any air is in the dictation or transcription are unintentional.  [facility] 05/10/2016 3:13 PM

## 2016-07-07 ENCOUNTER — Ambulatory Visit (INDEPENDENT_AMBULATORY_CARE_PROVIDER_SITE_OTHER): Payer: PPO | Admitting: Family Medicine

## 2016-07-07 ENCOUNTER — Encounter: Payer: Self-pay | Admitting: Family Medicine

## 2016-07-07 ENCOUNTER — Encounter: Payer: Self-pay | Admitting: General Surgery

## 2016-07-07 ENCOUNTER — Telehealth: Payer: Self-pay | Admitting: Physician Assistant

## 2016-07-07 VITALS — BP 138/60 | HR 84 | Temp 98.1°F | Resp 16 | Wt 175.0 lb

## 2016-07-07 DIAGNOSIS — K219 Gastro-esophageal reflux disease without esophagitis: Secondary | ICD-10-CM | POA: Diagnosis not present

## 2016-07-07 DIAGNOSIS — Z1231 Encounter for screening mammogram for malignant neoplasm of breast: Secondary | ICD-10-CM

## 2016-07-07 DIAGNOSIS — G629 Polyneuropathy, unspecified: Secondary | ICD-10-CM

## 2016-07-07 DIAGNOSIS — N6320 Unspecified lump in the left breast, unspecified quadrant: Secondary | ICD-10-CM

## 2016-07-07 MED ORDER — ESOMEPRAZOLE MAGNESIUM 20 MG PO CPDR
20.0000 mg | DELAYED_RELEASE_CAPSULE | Freq: Every day | ORAL | 3 refills | Status: AC
Start: 2016-07-07 — End: ?

## 2016-07-07 NOTE — Telephone Encounter (Signed)
Sandra Fritz patient

## 2016-07-07 NOTE — Telephone Encounter (Signed)
Please review

## 2016-07-07 NOTE — Telephone Encounter (Signed)
Per [facility] order for diagnostic mammogram needs to be TOMO. [id number] and ultrasound need to be changed to limited,not complete

## 2016-07-07 NOTE — Telephone Encounter (Signed)
Orders corrected-aa

## 2016-07-07 NOTE — Progress Notes (Signed)
Patient: Sandra Fritz Fritz Female    DOB: 1934-04-01   82 y.o.   MRN: [id number] Visit Date: 07/07/2016  Today's Provider: Wilhemena Durie, MD   Chief Complaint  Patient presents with  . Breast Mass   Subjective:    HPI    Breast Mass: Patient presents for evaluation of a left breast mass. Change was noted 1 week ago. Patient does routinely do self breast exams.  Patient has noted a change on breast exam. Breast cancer risk factors include family hx on mother's side and family hx on father's side. There is also ovarian cancer on mother's side.  Age of menarche was 66. Age of menopause was mid 70's. Patient denies current hormonal therapy. Patient is G0P0. Patient denies nipple discharge. Patient denies to previous breast biopsy. Patient denies a personal history of breast cancer. Pt's last mammogram was 07/17/2014 and was BI-RADS 1.  Pt also has GERD and takes Nexium which she takes OTC. Pt is requesting a prescription for esomeprazole 20 mg one tablet po qd.  Allergies  Allergen Reactions  . Erythromycin     ALL MYCINS  . Keflex  [Cephalexin]     hives  . Statins     Muscle and joint pain-patient states she tried all statins.     Current Outpatient Prescriptions:  .  aspirin 81 MG tablet, Take by mouth., Disp: , Rfl:  .  clobetasol cream (TEMOVATE) 8.41 %, Apply 1 application topically 2 (two) times daily., Disp: , Rfl:  .  esomeprazole (NEXIUM) 20 MG capsule, Take 1 capsule by mouth daily., Disp: , Rfl:  .  furosemide (LASIX) 20 MG tablet, TAKE 1 TABLET (20 MG TOTAL) BY MOUTH 2 (TWO) TIMES DAILY., Disp: 180 tablet, Rfl: 3 .  hydrocortisone 2.5 % cream, Apply topically 2 (two) times daily., Disp: , Rfl:  .  loratadine (CLARITIN) 10 MG tablet, Take 10 mg by mouth daily., Disp: , Rfl:  .  losartan-hydrochlorothiazide (HYZAAR) 100-12.5 MG tablet, Take 1 tablet by mouth daily., Disp: 30 tablet, Rfl: 12 .  rosuvastatin (CRESTOR) 5 MG tablet, Take 1 tablet (5 mg total) by  mouth every other day., Disp: 45 tablet, Rfl: 3 .  sucralfate (CARAFATE) 1 g tablet, Take 1 tablet (1 g total) by mouth 4 (four) times daily -  with meals and at bedtime., Disp: 120 tablet, Rfl: 12 .  Chlorpheniramine-DM (CORICIDIN HBP COUGH/COLD PO), Take by mouth., Disp: , Rfl:  .  Misc Natural Products (OSTEO BI-FLEX JOINT SHIELD PO), Take 2 tablets by mouth daily., Disp: , Rfl:   Review of Systems  Constitutional: Positive for fatigue. Negative for activity change, appetite change, chills, diaphoresis, fever and unexpected weight change.       Left breast mass present  Respiratory: Negative for shortness of breath.   Cardiovascular: Positive for leg swelling. Negative for chest pain and palpitations.    Social History  Substance Use Topics  . Smoking status: Never Smoker  . Smokeless tobacco: Never Used  . Alcohol use No     Comment: Maybe 3 per year   Objective:   BP 138/60 (BP Location: Right Arm, Patient Position: Sitting, Cuff Size: Normal)   Pulse 84   Temp 98.1 F (36.7 C) (Oral)   Resp 16   Wt 175 lb (79.4 kg)   BMI 25.84 kg/m  Vitals:   07/07/16 1042  BP: 138/60  Pulse: 84  Resp: 16  Temp: 98.1 F (36.7 C)  TempSrc:  Oral  Weight: 175 lb (79.4 kg)     Physical Exam  Constitutional: She appears well-developed and well-nourished.  HENT:  Head: Normocephalic and atraumatic.  Cardiovascular: Normal rate and regular rhythm.   Pulmonary/Chest: Effort normal and breath sounds normal. Left breast exhibits mass (beaneath lateral areola from 3:00-5:00).  Left nipple slightly retracted   Neurological:  Monofilament showed decreased sensation in right great toe  Psychiatric: She has a normal mood and affect. Her behavior is normal.        Assessment & Plan:     1. Left breast mass New problem. Will order diagnostic mammo and [place], as well as refer to surgery. - Ambulatory referral to General Surgery - MM Digital Diagnostic Bilat - US BREAST COMPLETE UNI RIGHT  INC AXILLA - US BREAST COMPLETE UNI LEFT INC AXILLA  2. Neuropathy Will check level to R/O b12 deficiency. - Vitamin B12  3. Gastroesophageal reflux disease, esophagitis presence not specified Refills sent in. - esomeprazole (NEXIUM) 20 MG capsule; Take 1 capsule (20 mg total) by mouth daily.  Dispense: 90 capsule; Refill: 3     Patient seen and examined by Miguel Aschoff, MD, and note scribed by Renaldo Fiddler, CMA.  Sandra Fritz Fritz Cranford Mon, MD  [facility]

## 2016-07-07 NOTE — Telephone Encounter (Signed)
Ok thx.

## 2016-07-07 NOTE — Telephone Encounter (Signed)
Please review-aa 

## 2016-07-08 LAB — VITAMIN B12: Vitamin B-12: 449 pg/mL (ref 232–1245)

## 2016-07-09 ENCOUNTER — Ambulatory Visit: Payer: PPO | Admitting: Family Medicine

## 2016-07-15 ENCOUNTER — Ambulatory Visit
Admission: RE | Admit: 2016-07-15 | Discharge: 2016-07-15 | Disposition: A | Discharge status: Home / Self Care | Payer: PPO | Source: Ambulatory Visit | Attending: Family Medicine | Admitting: Family Medicine

## 2016-07-15 ENCOUNTER — Ambulatory Visit: Admission: RE | Admit: 2016-07-15 | Payer: PPO | Source: Ambulatory Visit

## 2016-07-15 DIAGNOSIS — N6320 Unspecified lump in the left breast, unspecified quadrant: Secondary | ICD-10-CM | POA: Diagnosis not present

## 2016-07-15 DIAGNOSIS — N6321 Unspecified lump in the left breast, upper outer quadrant: Secondary | ICD-10-CM | POA: Diagnosis not present

## 2016-07-15 DIAGNOSIS — R922 Inconclusive mammogram: Secondary | ICD-10-CM | POA: Diagnosis not present

## 2016-07-15 DIAGNOSIS — N6489 Other specified disorders of breast: Secondary | ICD-10-CM | POA: Diagnosis not present

## 2016-07-15 DIAGNOSIS — Z1231 Encounter for screening mammogram for malignant neoplasm of breast: Secondary | ICD-10-CM

## 2016-07-18 ENCOUNTER — Other Ambulatory Visit: Payer: Self-pay | Admitting: Family Medicine

## 2016-07-18 DIAGNOSIS — R921 Mammographic calcification found on diagnostic imaging of breast: Secondary | ICD-10-CM

## 2016-07-18 DIAGNOSIS — R928 Other abnormal and inconclusive findings on diagnostic imaging of breast: Secondary | ICD-10-CM

## 2016-07-18 DIAGNOSIS — N6320 Unspecified lump in the left breast, unspecified quadrant: Secondary | ICD-10-CM

## 2016-07-19 ENCOUNTER — Encounter: Payer: Self-pay | Admitting: *Deleted

## 2016-07-25 ENCOUNTER — Ambulatory Visit (INDEPENDENT_AMBULATORY_CARE_PROVIDER_SITE_OTHER): Payer: PPO | Admitting: General Surgery

## 2016-07-25 ENCOUNTER — Encounter: Payer: Self-pay | Admitting: General Surgery

## 2016-07-25 ENCOUNTER — Inpatient Hospital Stay: Payer: Self-pay

## 2016-07-25 VITALS — BP 136/74 | HR 72 | Resp 14 | Ht 69.0 in | Wt 178.0 lb

## 2016-07-25 DIAGNOSIS — C50812 Malignant neoplasm of overlapping sites of left female breast: Secondary | ICD-10-CM | POA: Diagnosis not present

## 2016-07-25 DIAGNOSIS — N6320 Unspecified lump in the left breast, unspecified quadrant: Secondary | ICD-10-CM | POA: Diagnosis not present

## 2016-07-25 DIAGNOSIS — C50412 Malignant neoplasm of upper-outer quadrant of left female breast: Secondary | ICD-10-CM | POA: Diagnosis not present

## 2016-07-25 DIAGNOSIS — R928 Other abnormal and inconclusive findings on diagnostic imaging of breast: Secondary | ICD-10-CM

## 2016-07-25 NOTE — Progress Notes (Addendum)
Patient ID: Sandra Fritz, female   DOB: 12/11/1934, 82 y.o.   MRN: [id number]  Chief Complaint  Patient presents with  . Other    Left Breast nodule    HPI Sandra Fritz is a 82 y.o. female who presents for a breast evaluation. The most recent mammogram and left breast ultrasound was done on 07/15/2016. Patient noticed a lump in her left breast two months ago. She also noticed that her left nipple was inverted. No pain or discharge.  Patient does perform regular self breast checks and miss her mammogram in 2017. Patient [place] in [place] [employer]. HPI  Past Medical History:  Diagnosis Date  . Anemia   . Anxiety   . Hyperlipidemia   . Hypertension     Past Surgical History:  Procedure Laterality Date  . APPENDECTOMY  1955   SECONDARY TO RUTURED APPENDIX  . CATARACT EXTRACTION, BILATERAL  2011  . COLONOSCOPY  2002  . DILATION AND CURETTAGE OF UTERUS  1989   Dr. Laurey Morale  . JOINT REPLACEMENT     Right hip  . MOLE REMOVAL  1957   18 removed by Dr. Hoyle Sauer  . NECK SURGERY    . TONSILLECTOMY  1955    Family History  Problem Relation Age of Onset  . Macular degeneration Mother   . Hypertension Mother   . Diabetes Father   . Heart disease Father   . Heart attack Father 27  . Heart disease Brother 29  . Dermatomyositis Sister   . Cancer Sister     Carcinoma of the uterine call attached to colon and is in remission now  . Breast cancer Maternal Aunt 70  . Breast cancer Paternal Aunt 60  . Breast cancer Cousin     paternal aunt w/ breast ca daughter  . Breast cancer Maternal Aunt 60    Social History Social History  Substance Use Topics  . Smoking status: Never Smoker  . Smokeless tobacco: Never Used  . Alcohol use No     Comment: Maybe 3 per year    Allergies  Allergen Reactions  . Erythromycin     ALL MYCINS  . Keflex  [Cephalexin]     hives  . Statins     Muscle and joint pain-patient states she tried all statins.    Current Outpatient Prescriptions   Medication Sig Dispense Refill  . aspirin 81 MG tablet Take by mouth.    . Chlorpheniramine-DM (CORICIDIN HBP COUGH/COLD PO) Take by mouth.    . clobetasol cream (TEMOVATE) 7.41 % Apply 1 application topically 2 (two) times daily.    Marland Kitchen esomeprazole (NEXIUM) 20 MG capsule Take 1 capsule (20 mg total) by mouth daily. 90 capsule 3  . furosemide (LASIX) 20 MG tablet TAKE 1 TABLET (20 MG TOTAL) BY MOUTH 2 (TWO) TIMES DAILY. 180 tablet 3  . hydrocortisone 2.5 % cream Apply topically 2 (two) times daily.    Marland Kitchen loratadine (CLARITIN) 10 MG tablet Take 10 mg by mouth daily.    Marland Kitchen losartan-hydrochlorothiazide (HYZAAR) 100-12.5 MG tablet Take 1 tablet by mouth daily. 30 tablet 12  . Misc Natural Products (OSTEO BI-FLEX JOINT SHIELD PO) Take 2 tablets by mouth daily.    . rosuvastatin (CRESTOR) 5 MG tablet Take 1 tablet (5 mg total) by mouth every other day. 45 tablet 3  . sucralfate (CARAFATE) 1 g tablet Take 1 tablet (1 g total) by mouth 4 (four) times daily -  with meals and at bedtime. Cricket  tablet 12   No current facility-administered medications for this visit.     Review of Systems Review of Systems  Constitutional: Negative.   Respiratory: Negative.   Cardiovascular: Negative.     Blood pressure 136/74, pulse 72, resp. rate 14, height '5\' 9"'  (1.753 m), weight 178 lb (80.7 kg).  Physical Exam Physical Exam  Constitutional: She is oriented to person, place, and time. She appears well-developed and well-nourished.  Eyes: Conjunctivae are normal. No scleral icterus.  Neck: Neck supple.  Cardiovascular: Normal rate, regular rhythm and normal heart sounds.   Pulmonary/Chest: Effort normal and breath sounds normal. Right breast exhibits no inverted nipple, no mass, no nipple discharge, no skin change and no tenderness. Left breast exhibits inverted nipple (partially). Left breast exhibits no nipple discharge, no skin change and no tenderness. Mass: firm mass at 3 o'clock 3 cm from nipple.     Lymphadenopathy:    She has no cervical adenopathy.    She has no axillary adenopathy.       Left: No supraclavicular adenopathy present.  Neurological: She is alert and oriented to person, place, and time.  Skin: Skin is warm and dry.    Data Reviewed Bilateral diagnostic mammograms and left breast ultrasound completed 07/15/2016 reviewed. Right breast shows 2 areas of microcalcifications. Left breast shows an ill-defined mass on ultrasound measuring 1.5 x 1.8 x 2.7 cm. BI-RADS-5.  Ultrasound examination of the breast showed ill-defined hypoechoic mass within shadowing at the 3:00 position 1 cm from nipple measuring 1.2 x 1.7 x 1.93 cm. BIRAD-5.  The patient was amenable to ultrasound-guided core biopsy. The procedure was reviewed. The breast was prepped with alcohol and 10 mL of 0.5% Xylocaine with 0.25% Marcaine with 1:200,000 epinephrine was utilized well tolerated. ChloraPrep was applied to the skin. The area was approached from a LM direction and the skin was incised sharply with an 11 blade. Initial biopsy was completed making use of a Finesse 12-gauge device. This malfunctioned and the remaining biopsies were completed making use of a 14-gauge Bard spring loaded biopsy device. 5 long cores were obtained. Postbiopsy clip was placed. Scant bleeding was noted. Benzoin followed by Steri-Strips, Telfa and Tegaderm were applied.   Assessment    Left breast mass strongly suspicious for malignancy.  Microcalcifications of the right breast which warrant assessment and a later date.    Plan    The patient tolerated the biopsy procedure well. She'll be contacted when results are available. She was informed of a strong suspicion for malignancy. Based on the reported size up to 2.7 cm on the hospital ultrasound, (1.93 cm here) the patient may be a candidate for neoadjuvant chemotherapy. Certainly if she is looking at breast conservation this would be mandatory as with the skin retraction the  close location of the overlying skin it may be difficult to save the nipple areolar complex with or without neoadjuvant treatment.  The patient made a point of relating that she is child less and does not want to be a burden to her family members in the area. She is concerned about side effects from any treatment. Surgical or chemotherapy. I encouraged her to be open-minded about her options to help manage this significant problem.    HPI, Physical Exam, Assessment and Plan have been scribed under the direction and in the presence of Hervey Ard, MD.  Gaspar Cola, CMA I have completed the exam and reviewed the above documentation for accuracy and completeness.  I agree with the above.  Haematologist has been used and any errors in dictation or transcription are unintentional.  Hervey Ard, M.D., F.A.C.S.  Robert Bellow 07/25/2016, 5:42 PM  Patient was sent to [facility] today to have the following labs drawn: CBC, Met C, CEA, and CA 27-29.   Dominga Ferry, CMA

## 2016-07-25 NOTE — Patient Instructions (Signed)

## 2016-07-26 ENCOUNTER — Telehealth: Payer: Self-pay | Admitting: *Deleted

## 2016-07-26 ENCOUNTER — Other Ambulatory Visit: Payer: Self-pay

## 2016-07-26 DIAGNOSIS — C50911 Malignant neoplasm of unspecified site of right female breast: Secondary | ICD-10-CM

## 2016-07-26 LAB — CBC WITH DIFFERENTIAL/PLATELET
BASOS: 1 %
Basophils Absolute: 0 10*3/uL (ref 0.0–0.2)
EOS (ABSOLUTE): 0.3 10*3/uL (ref 0.0–0.4)
EOS: 4 %
HEMATOCRIT: 33.6 % — AB (ref 34.0–46.6)
Hemoglobin: 11.1 g/dL (ref 11.1–15.9)
Immature Grans (Abs): 0 10*3/uL (ref 0.0–0.1)
Immature Granulocytes: 0 %
LYMPHS ABS: 2.9 10*3/uL (ref 0.7–3.1)
Lymphs: 40 %
MCH: 27 pg (ref 26.6–33.0)
MCHC: 33 g/dL (ref 31.5–35.7)
MCV: 82 fL (ref 79–97)
MONOS ABS: 0.5 10*3/uL (ref 0.1–0.9)
Monocytes: 6 %
Neutrophils Absolute: 3.6 10*3/uL (ref 1.4–7.0)
Neutrophils: 49 %
Platelets: 386 10*3/uL — ABNORMAL HIGH (ref 150–379)
RBC: 4.11 x10E6/uL (ref 3.77–5.28)
RDW: 15.6 % — AB (ref 12.3–15.4)
WBC: 7.3 10*3/uL (ref 3.4–10.8)

## 2016-07-26 LAB — COMPREHENSIVE METABOLIC PANEL
ALK PHOS: 70 IU/L (ref 39–117)
ALT: 21 IU/L (ref 0–32)
AST: 25 IU/L (ref 0–40)
Albumin/Globulin Ratio: 1.8 (ref 1.2–2.2)
Albumin: 4.6 g/dL (ref 3.5–4.8)
BUN/Creatinine Ratio: 18 (ref 12–28)
BUN: 20 mg/dL (ref 8–27)
Bilirubin Total: 0.3 mg/dL (ref 0.0–1.2)
CO2: 25 mmol/L (ref 18–29)
CREATININE: 1.11 mg/dL — AB (ref 0.57–1.00)
Calcium: 10.2 mg/dL (ref 8.7–10.3)
Chloride: 94 mmol/L — ABNORMAL LOW (ref 96–106)
GFR calc Af Amer: 55 mL/min/{1.73_m2} — ABNORMAL LOW (ref >59)
GFR calc non Af Amer: 47 mL/min/{1.73_m2} — ABNORMAL LOW (ref >59)
Globulin, Total: 2.6 g/dL (ref 1.5–4.5)
Glucose: 125 mg/dL — ABNORMAL HIGH (ref 65–99)
Potassium: 4.4 mmol/L (ref 3.5–5.2)
SODIUM: 139 mmol/L (ref 134–144)
Total Protein: 7.2 g/dL (ref 6.0–8.5)

## 2016-07-26 LAB — CANCER ANTIGEN 27.29: CA 27.29: 22.6 U/mL (ref 0.0–38.6)

## 2016-07-26 LAB — CEA: CEA: 3.1 ng/mL (ref 0.0–4.7)

## 2016-07-26 NOTE — Telephone Encounter (Signed)
She called answering service at lunch time for her breast biopsy results. Thanks

## 2016-07-28 ENCOUNTER — Telehealth: Payer: Self-pay | Admitting: General Surgery

## 2016-07-28 ENCOUNTER — Encounter: Payer: Self-pay | Admitting: *Deleted

## 2016-07-28 ENCOUNTER — Encounter: Payer: Self-pay | Admitting: Oncology

## 2016-07-28 ENCOUNTER — Inpatient Hospital Stay: Payer: PPO | Attending: Oncology | Admitting: Oncology

## 2016-07-28 VITALS — BP 151/79 | HR 90 | Temp 98.1°F | Resp 20 | Ht 69.09 in | Wt 176.8 lb

## 2016-07-28 DIAGNOSIS — F419 Anxiety disorder, unspecified: Secondary | ICD-10-CM | POA: Diagnosis not present

## 2016-07-28 DIAGNOSIS — Z803 Family history of malignant neoplasm of breast: Secondary | ICD-10-CM

## 2016-07-28 DIAGNOSIS — M129 Arthropathy, unspecified: Secondary | ICD-10-CM | POA: Diagnosis not present

## 2016-07-28 DIAGNOSIS — I10 Essential (primary) hypertension: Secondary | ICD-10-CM

## 2016-07-28 DIAGNOSIS — Z7982 Long term (current) use of aspirin: Secondary | ICD-10-CM

## 2016-07-28 DIAGNOSIS — C50911 Malignant neoplasm of unspecified site of right female breast: Secondary | ICD-10-CM

## 2016-07-28 DIAGNOSIS — Z5111 Encounter for antineoplastic chemotherapy: Secondary | ICD-10-CM | POA: Insufficient documentation

## 2016-07-28 DIAGNOSIS — Z7689 Persons encountering health services in other specified circumstances: Secondary | ICD-10-CM | POA: Diagnosis not present

## 2016-07-28 DIAGNOSIS — E785 Hyperlipidemia, unspecified: Secondary | ICD-10-CM

## 2016-07-28 DIAGNOSIS — I6200 Nontraumatic subdural hemorrhage, unspecified: Secondary | ICD-10-CM | POA: Diagnosis not present

## 2016-07-28 DIAGNOSIS — D649 Anemia, unspecified: Secondary | ICD-10-CM

## 2016-07-28 DIAGNOSIS — Z79899 Other long term (current) drug therapy: Secondary | ICD-10-CM | POA: Diagnosis not present

## 2016-07-28 DIAGNOSIS — J45909 Unspecified asthma, uncomplicated: Secondary | ICD-10-CM

## 2016-07-28 DIAGNOSIS — K219 Gastro-esophageal reflux disease without esophagitis: Secondary | ICD-10-CM | POA: Diagnosis not present

## 2016-07-28 DIAGNOSIS — R55 Syncope and collapse: Secondary | ICD-10-CM | POA: Diagnosis not present

## 2016-07-28 DIAGNOSIS — N289 Disorder of kidney and ureter, unspecified: Secondary | ICD-10-CM | POA: Insufficient documentation

## 2016-07-28 DIAGNOSIS — Z170 Estrogen receptor positive status [ER+]: Secondary | ICD-10-CM | POA: Diagnosis not present

## 2016-07-28 DIAGNOSIS — C50412 Malignant neoplasm of upper-outer quadrant of left female breast: Secondary | ICD-10-CM | POA: Diagnosis not present

## 2016-07-28 HISTORY — DX: Malignant neoplasm of upper-outer quadrant of left female breast: C50.412

## 2016-07-28 MED ORDER — PROCHLORPERAZINE MALEATE 10 MG PO TABS: 10.0000 mg | ORAL_TABLET | Freq: Four times a day (QID) | ORAL | 2 refills | Status: DC | PRN

## 2016-07-28 MED ORDER — LIDOCAINE-PRILOCAINE 2.5-2.5 % EX CREA: TOPICAL_CREAM | CUTANEOUS | 3 refills | Status: DC

## 2016-07-28 MED ORDER — ONDANSETRON HCL 8 MG PO TABS: 8.0000 mg | ORAL_TABLET | Freq: Two times a day (BID) | ORAL | 2 refills | Status: DC | PRN

## 2016-07-28 NOTE — Progress Notes (Signed)
START ON PATHWAY REGIMEN - Breast   Dose-Dense AC q14 days:   A cycle is every 14 days:     Doxorubicin      Cyclophosphamide      Pegfilgrastim   **Always confirm dose/schedule in your pharmacy ordering system**    Paclitaxel 80 mg/m2 Weekly:   Administer weekly:     Paclitaxel   **Always confirm dose/schedule in your pharmacy ordering system**    Patient Characteristics: Preoperative or Nonsurgical Candidate (Clinical Staging), Neoadjuvant Therapy followed by Surgery, Invasive Disease, Chemotherapy, HER2 Negative/Unknown/Equivocal, ER Positive Therapeutic Status: Preoperative or Nonsurgical Candidate (Clinical Staging) AJCC M Category: cM0 AJCC Grade: G2 Breast Surgical Plan: Neoadjuvant Therapy followed by Surgery ER Status: Positive (+) AJCC 8 Stage Grouping: IB HER2 Status: Negative (-) AJCC T Category: cT2 AJCC N Category: cN0 PR Status: Positive (+)  Intent of Therapy: Curative Intent, Discussed with Patient

## 2016-07-28 NOTE — Telephone Encounter (Signed)
The patient was seen by medical oncology earlier today and she is decided to proceed with recommendations for neoadjuvant chemotherapy. I think this is a good choice.  We reviewed the risks of the procedure including vascular and pulmonary injury.  As she has a left-sided tumor, we'll plan for right subclavian placement.  This will be scheduled while and placement will be done next week.

## 2016-07-28 NOTE — Progress Notes (Signed)
[place]  [phone] [phone]  ID: Sandra Fritz OB: 07-12-1934  MR#: [id number]  HFG#:[id number]  Patient Care Team: Jerrol Banana., MD as PCP - General (Unknown Physician Specialty) Jerrol Banana., MD (Family Medicine) Bary Castilla Forest Gleason, MD (General Surgery)  CHIEF COMPLAINT: Clinical stage Ib ER/PR positive, HER-2 negative invasive carcinoma of the upper outer quadrant of the left breast.  INTERVAL HISTORY: Patient is a 82 year old female who noticed an asymmetry in her breast approximately one month ago. Further evaluation with mammogram, ultrasound, and biopsy revealed the above stated breast cancer. Currently, she is anxious but otherwise feels well. She has no neurologic complaints. She denies any recent fevers or illnesses. She has a good appetite and denies weight loss. She has no chest pain or shortness of breath. She denies any nausea, vomiting, constipation, or diarrhea. She has no urinary complaints. Patient otherwise feels well and offers no further specific complaints.  REVIEW OF SYSTEMS:   Review of Systems  Constitutional: Negative.  Negative for fever, malaise/fatigue and weight loss.  Respiratory: Negative.  Negative for cough and shortness of breath.   Cardiovascular: Negative.  Negative for chest pain and leg swelling.  Gastrointestinal: Negative.  Negative for abdominal pain.  Genitourinary: Negative.   Musculoskeletal: Negative.   Skin: Negative.  Negative for rash.  Neurological: Negative.  Negative for sensory change and weakness.  Psychiatric/Behavioral: The patient is nervous/anxious.     As per HPI. Otherwise, a complete review of systems is negative.  PAST MEDICAL HISTORY: Past Medical History:  Diagnosis Date  . Anemia   . Anxiety   . Arthritis   . Asthma   . Blood transfusion without reported diagnosis   . Cataract   . GERD (gastroesophageal reflux disease)   . Hyperlipidemia   .  Hypertension   . Neuromuscular disorder ([place])   . Primary cancer of upper outer quadrant of left female breast ([place]) 07/28/2016    PAST SURGICAL HISTORY: Past Surgical History:  Procedure Laterality Date  . APPENDECTOMY  1955   SECONDARY TO RUTURED APPENDIX  . CATARACT EXTRACTION, BILATERAL  2011  . COLONOSCOPY  2002  . DILATION AND CURETTAGE OF UTERUS  1989   Dr. Laurey Morale  . JOINT REPLACEMENT     Right hip  . MOLE REMOVAL  1957   18 removed by Dr. Hoyle Sauer  . NECK SURGERY    . TONSILLECTOMY  1955    FAMILY HISTORY: Family History  Problem Relation Age of Onset  . Macular degeneration Mother   . Hypertension Mother   . Diabetes Father   . Heart disease Father   . Heart attack Father 55  . Heart disease Brother 51  . Dermatomyositis Sister   . Cancer Sister        Carcinoma of the uterine call attached to colon and is in remission now  . Breast cancer Maternal Aunt 70  . Breast cancer Paternal Aunt 11  . Breast cancer Cousin        paternal aunt w/ breast ca daughter  . Breast cancer Maternal Aunt 60    ADVANCED DIRECTIVES (Y/N):  N  HEALTH MAINTENANCE: Social History  Substance Use Topics  . Smoking status: Never Smoker  . Smokeless tobacco: Never Used  . Alcohol use No     Comment: Maybe 3 per year     Colonoscopy:  PAP:  Bone density:  Lipid panel:  Allergies  Allergen Reactions  . Erythromycin  ALL MYCINS  . Keflex  [Cephalexin]     hives  . Statins     Muscle and joint pain-patient states she tried all statins.    Current Outpatient Prescriptions  Medication Sig Dispense Refill  . aspirin 81 MG tablet Take by mouth.    . Chlorpheniramine-DM (CORICIDIN HBP COUGH/COLD PO) Take by mouth.    . clobetasol cream (TEMOVATE) 8.41 % Apply 1 application topically 2 (two) times daily.    Marland Kitchen esomeprazole (NEXIUM) 20 MG capsule Take 1 capsule (20 mg total) by mouth daily. 90 capsule 3  . furosemide (LASIX) 20 MG tablet TAKE 1 TABLET (20 MG TOTAL) BY  MOUTH 2 (TWO) TIMES DAILY. 180 tablet 3  . hydrocortisone 2.5 % cream Apply topically 2 (two) times daily.    Marland Kitchen loratadine (CLARITIN) 10 MG tablet Take 10 mg by mouth daily.    Marland Kitchen losartan-hydrochlorothiazide (HYZAAR) 100-12.5 MG tablet Take 1 tablet by mouth daily. 30 tablet 12  . Misc Natural Products (OSTEO BI-FLEX JOINT SHIELD PO) Take 2 tablets by mouth daily.    . sucralfate (CARAFATE) 1 g tablet Take 1 tablet (1 g total) by mouth 4 (four) times daily -  with meals and at bedtime. 120 tablet 12  . rosuvastatin (CRESTOR) 5 MG tablet Take 1 tablet (5 mg total) by mouth every other day. (Patient not taking: Reported on 07/28/2016) 45 tablet 3   No current facility-administered medications for this visit.     OBJECTIVE: Vitals:   07/28/16 1010  BP: (!) 151/79  Pulse: 90  Resp: 20  Temp: 98.1 F (36.7 C)     Body mass index is 26.04 kg/m.    ECOG FS:0 - Asymptomatic  General: Well-developed, well-nourished, no acute distress. Eyes: Pink conjunctiva, anicteric sclera. Breasts: Palpable left breast mass. HEENT: Normocephalic, moist mucous membranes, clear oropharnyx. Lungs: Clear to auscultation bilaterally. Heart: Regular rate and rhythm. No rubs, murmurs, or gallops. Abdomen: Soft, nontender, nondistended. No organomegaly noted, normoactive bowel sounds. Musculoskeletal: No edema, cyanosis, or clubbing. Neuro: Alert, answering all questions appropriately. Cranial nerves grossly intact. Skin: No rashes or petechiae noted. Psych: Normal affect. Lymphatics: No cervical, calvicular, axillary or inguinal LAD.   LAB RESULTS:  Lab Results  Component Value Date   NA 139 07/25/2016   K 4.4 07/25/2016   CL 94 (L) 07/25/2016   CO2 25 07/25/2016   GLUCOSE 125 (H) 07/25/2016   BUN 20 07/25/2016   CREATININE 1.11 (H) 07/25/2016   CALCIUM 10.2 07/25/2016   PROT 7.2 07/25/2016   ALBUMIN 4.6 07/25/2016   AST 25 07/25/2016   ALT 21 07/25/2016   ALKPHOS 70 07/25/2016   BILITOT 0.3  07/25/2016   GFRNONAA 47 (L) 07/25/2016   GFRAA 55 (L) 07/25/2016    Lab Results  Component Value Date   WBC 7.3 07/25/2016   NEUTROABS 3.6 07/25/2016   HGB 11.5 (A) 02/05/2014   HCT 33.6 (L) 07/25/2016   MCV 82 07/25/2016   PLT 386 (H) 07/25/2016     STUDIES: US Breast Complete Uni Left Inc Axilla  Result Date: 07/25/2016 Ultrasound examination of the breast showed ill-defined hypoechoic mass within shadowing at the 3:00 position 1 cm from nipple measuring 1.2 x 1.7 x 1.93 cm. BIRAD-5. The patient was amenable to ultrasound-guided core biopsy. The procedure was reviewed. The breast was prepped with alcohol and 10 mL of 0.5% Xylocaine with 0.25% Marcaine with 1:200,000 epinephrine was utilized well tolerated. ChloraPrep was applied to the skin. The area was approached  from a LM direction and the skin was incised sharply with an 11 blade. Initial biopsy was completed making use of a Finesse 12-gauge device. This malfunctioned and the remaining biopsies were completed making use of a 14-gauge Bard spring loaded biopsy device. 5 long cores were obtained. Postbiopsy clip was placed. Scant bleeding was noted. Benzoin followed by Steri-Strips, Telfa and Tegaderm were applied.   US Breast Ltd Uni Left Inc Axilla  Result Date: 07/15/2016 CLINICAL DATA:  Patient presents for palpable abnormality within the lateral left breast. EXAM: DIGITAL DIAGNOSTIC BILATERAL MAMMOGRAM WITH CAD ULTRASOUND LEFT BREAST COMPARISON:  Previous exam(s). ACR Breast Density Category c: The breast tissue is heterogeneously dense, which may obscure small masses. FINDINGS: There is an irregular mass with associated distortion within the lateral left breast underlying the palpable marker. Within the anterior right breast centrally there is a 4 mm group of coarse heterogeneous calcifications. Additionally within the 6 o'clock position right breast anterior depth there is a 4 mm group of amorphous calcifications. Mammographic  images were processed with CAD. On physical exam, I palpate a firm mass within the lateral left breast. Targeted ultrasound is performed, showing a mixed echogenicity predominately hyperechoic irregular mass left breast 2 o'clock position 1 cm from nipple measuring 2.7 x 1.5 x 1.8 cm, corresponding with palpable abnormality. No left axillary adenopathy. IMPRESSION: Suspicious palpable left breast mass. Two adjacent groups of indeterminate right breast calcifications. RECOMMENDATION: Ultrasound-guided core needle biopsy palpable suspicious left breast mass. Given findings within the left breast concerning for malignancy, recommend stereotactic guided biopsy of 2 adjacent groups of calcifications within the anterior right breast for definitive characterization prior to treatment for suspected left breast malignancy. I have discussed the findings and recommendations with the patient. Results were also provided in writing at the conclusion of the visit. If applicable, a reminder letter will be sent to the patient regarding the next appointment. BI-RADS CATEGORY  5: Highly suggestive of malignancy. Electronically Signed   By: Lovey Newcomer M.D.   On: 07/15/2016 16:07   Mm Diag Breast Tomo Bilateral  Result Date: 07/15/2016 CLINICAL DATA:  Patient presents for palpable abnormality within the lateral left breast. EXAM: DIGITAL DIAGNOSTIC BILATERAL MAMMOGRAM WITH CAD ULTRASOUND LEFT BREAST COMPARISON:  Previous exam(s). ACR Breast Density Category c: The breast tissue is heterogeneously dense, which may obscure small masses. FINDINGS: There is an irregular mass with associated distortion within the lateral left breast underlying the palpable marker. Within the anterior right breast centrally there is a 4 mm group of coarse heterogeneous calcifications. Additionally within the 6 o'clock position right breast anterior depth there is a 4 mm group of amorphous calcifications. Mammographic images were processed with CAD. On  physical exam, I palpate a firm mass within the lateral left breast. Targeted ultrasound is performed, showing a mixed echogenicity predominately hyperechoic irregular mass left breast 2 o'clock position 1 cm from nipple measuring 2.7 x 1.5 x 1.8 cm, corresponding with palpable abnormality. No left axillary adenopathy. IMPRESSION: Suspicious palpable left breast mass. Two adjacent groups of indeterminate right breast calcifications. RECOMMENDATION: Ultrasound-guided core needle biopsy palpable suspicious left breast mass. Given findings within the left breast concerning for malignancy, recommend stereotactic guided biopsy of 2 adjacent groups of calcifications within the anterior right breast for definitive characterization prior to treatment for suspected left breast malignancy. I have discussed the findings and recommendations with the patient. Results were also provided in writing at the conclusion of the visit. If applicable, a reminder letter will be sent  to the patient regarding the next appointment. BI-RADS CATEGORY  5: Highly suggestive of malignancy. Electronically Signed   By: Lovey Newcomer M.D.   On: 07/15/2016 16:07    ASSESSMENT: Clinical stage Ib ER/PR positive, HER-2 negative invasive carcinoma of the upper outer quadrant of the left breast.  PLAN:    1. Clinical stage Ib ER/PR positive, HER-2 negative invasive carcinoma of the upper outer quadrant of the left breast: Given the size of patient's breast mass at 2.7 cm, have recommended neoadjuvant chemotherapy using Adriamycin, Cytoxan, and Taxol. She will also require Neulasta support. Prior to initiating treatment patient will require a MUGA scan as well as port placement. Once she completes neoadjuvant chemotherapy, will refer back to surgery for possible lumpectomy. Patient will also require adjuvant XRT. Finally, given the ER/PR status of her tumor she will benefit from an aromatase inhibitor for 5 years. Return to clinic on Aug 09, 2016 to  initiate cycle 1 of Adriamycin and Cytoxan.  Approximately 45 minutes was spent in discussion of which greater than 50% was consultation.  Patient expressed understanding and was in agreement with this plan. She also understands that She can call clinic at any time with any questions, concerns, or complaints.   Cancer Staging Primary cancer of upper outer quadrant of left female breast [facility]) Staging form: Breast, AJCC 8th Edition - Clinical stage from 07/28/2016: Stage IB (cT2, cN0, cM0, G2, ER: Positive, PR: Positive, HER2: Negative) - Signed by Lloyd Huger, MD on 07/28/2016   Lloyd Huger, MD   07/28/2016 4:26 PM

## 2016-07-28 NOTE — Progress Notes (Signed)
  Oncology Nurse Navigator Documentation  Navigator Location: [facility]-Med Onc (07/28/16 1200) Referral date to RadOnc/MedOnc: 07/28/16 (07/28/16 1200) )Navigator Encounter Type: Initial MedOnc (07/28/16 1200)   Abnormal Finding Date: 07/15/16 (07/28/16 1200) Confirmed Diagnosis Date: 07/26/16 (07/28/16 1200)             Treatment Initiated Date: 08/09/16 (07/28/16 1200) Patient Visit Type: MedOnc (07/28/16 1200) Treatment Phase: Pre-Tx/Tx Discussion (07/28/16 1200) Barriers/Navigation Needs: Education (07/28/16 1200) Education: Understanding Cancer/ Treatment Options;Coping with Diagnosis/ Prognosis;Newly Diagnosed Cancer Education (07/28/16 1200) Interventions: Education (07/28/16 1200)     Education Method: Verbal;Written (07/28/16 1200)  Support Groups/Services: Breast Support Group;Other (07/28/16 1200) Specialty Items/DME: Wigs (07/28/16 1200) Acuity: Level 2 (07/28/16 1200)         Time Spent with Patient: 60 (07/28/16 1200)   Met with patient and her sister today during her initial medical oncology consult with Dr. Grayland Ormond.  Gave patient breast cancer educational literature, "My Breast Cancer Treatment Handbook" by Josephine Igo, RN.  Reviewed plan of care and cancer center resources.  She is to call if she has any questions or needs.

## 2016-07-29 ENCOUNTER — Telehealth: Payer: Self-pay

## 2016-07-29 NOTE — Telephone Encounter (Addendum)
Call to patient to see about scheduling her port placement. The patient is scheduled for surgery at [facility] on 08/05/16. She will pre admit by phone. Surgery instructions reviewed with the patient. She is aware of date and instructions.

## 2016-07-30 ENCOUNTER — Other Ambulatory Visit: Payer: Self-pay | Admitting: General Surgery

## 2016-07-30 DIAGNOSIS — C50412 Malignant neoplasm of upper-outer quadrant of left female breast: Secondary | ICD-10-CM

## 2016-07-30 DIAGNOSIS — Z170 Estrogen receptor positive status [ER+]: Principal | ICD-10-CM

## 2016-08-02 ENCOUNTER — Ambulatory Visit: Payer: PPO | Admitting: Oncology

## 2016-08-02 ENCOUNTER — Inpatient Hospital Stay: Admission: RE | Admit: 2016-08-02 | Payer: PPO | Source: Ambulatory Visit

## 2016-08-02 NOTE — Patient Instructions (Signed)

## 2016-08-03 ENCOUNTER — Encounter
Admission: RE | Admit: 2016-08-03 | Discharge: 2016-08-03 | Disposition: A | Discharge status: Home / Self Care | Payer: PPO | Source: Ambulatory Visit | Attending: General Surgery | Admitting: General Surgery

## 2016-08-03 ENCOUNTER — Encounter
Admission: RE | Admit: 2016-08-03 | Discharge: 2016-08-03 | Disposition: A | Discharge status: Home / Self Care | Payer: PPO | Source: Ambulatory Visit | Attending: Oncology | Admitting: Oncology

## 2016-08-03 ENCOUNTER — Encounter: Payer: Self-pay | Admitting: Family Medicine

## 2016-08-03 DIAGNOSIS — C50911 Malignant neoplasm of unspecified site of right female breast: Secondary | ICD-10-CM | POA: Insufficient documentation

## 2016-08-03 DIAGNOSIS — C50912 Malignant neoplasm of unspecified site of left female breast: Secondary | ICD-10-CM | POA: Diagnosis not present

## 2016-08-03 HISTORY — DX: Other complications of anesthesia, initial encounter: T88.59XA

## 2016-08-03 HISTORY — DX: Adverse effect of unspecified anesthetic, initial encounter: T41.45XA

## 2016-08-03 MED ORDER — TECHNETIUM TC 99M-LABELED RED BLOOD CELLS IV KIT
21.7200 | PACK | Freq: Once | INTRAVENOUS | Status: AC | PRN
  Administered 2016-08-03: 21.72 via INTRAVENOUS

## 2016-08-03 NOTE — Patient Instructions (Signed)
  Your procedure is scheduled on: 08-05-16  Report to Same Day Surgery 2nd floor medical mall [facility] Entrance-take elevator on left to 2nd floor.  Check in with surgery information desk.) To find out your arrival time please call [phone] between 1PM - 3PM on 08-04-16  Remember: Instructions that are not followed completely may result in serious medical risk, up to and including death, or upon the discretion of your surgeon and anesthesiologist your surgery may need to be rescheduled.    _x___ 1. Do not eat food or drink liquids after midnight. No gum chewing or                              hard candies.     __x__ 2. No Alcohol for 24 hours before or after surgery.   __x__3. No Smoking for 24 prior to surgery.   ____  4. Bring all medications with you on the day of surgery if instructed.    __x__ 5. Notify your doctor if there is any change in your medical condition     (cold, fever, infections).     Do not wear jewelry, make-up, hairpins, clips or nail polish.  Do not wear lotions, powders, or perfumes. You may wear deodorant.  Do not shave 48 hours prior to surgery. Men may shave face and neck.  Do not bring valuables to the hospital.    [facility] is not responsible for any belongings or valuables.               Contacts, dentures or bridgework may not be worn into surgery.  Leave your suitcase in the car. After surgery it may be brought to your room.  For patients admitted to the hospital, discharge time is determined by your                       treatment team.   Patients discharged the day of surgery will not be allowed to drive home.  You will need someone to drive you home and stay with you the night of your procedure.    Please read over the following fact sheets that you were given:   [facility] Preparing for Surgery and or MRSA Information   _x___ Take anti-hypertensive (unless it includes a diuretic), cardiac, seizure, asthma,     anti-reflux and psychiatric  medicines. These include:  1. [place]  2. TAKE AN EXTRA NEXIUM ON Thursday NIGHT BEFORE BED  3.  4.  5.  6.  ____Fleets enema or Magnesium Citrate as directed.   ____ Use CHG Soap or sage wipes as directed on instruction sheet   ____ Use inhalers on the day of surgery and bring to hospital day of surgery  ____ Stop Metformin and Janumet 2 days prior to surgery.    ____ Take 1/2 of usual insulin dose the night before surgery and none on the morning     surgery.   ____ Follow recommendations from Cardiologist, Pulmonologist or PCP regarding stopping Aspirin, Coumadin, Pllavix ,Eliquis, Effient, or Pradaxa, and Pletal-OK TO CONTINUE ASA-DO NOT TAKE AM OF SURGERY  ____Stop Anti-inflammatories such as Advil, Aleve, Ibuprofen, Motrin, Naproxen, Naprosyn, Goodies powders or aspirin products. OK to take Tylenol    _x___ Stop supplements until after surgery-STOP OSTEO-BIFLEX NOW  ____ Bring C-Pap to the hospital.

## 2016-08-04 ENCOUNTER — Ambulatory Visit: Payer: PPO | Admitting: Oncology

## 2016-08-04 ENCOUNTER — Inpatient Hospital Stay: Payer: PPO

## 2016-08-04 MED ORDER — CLINDAMYCIN PHOSPHATE 900 MG/50ML IV SOLN
900.0000 mg | Freq: Once | INTRAVENOUS | Status: AC
  Administered 2016-08-05: 900 mg via INTRAVENOUS

## 2016-08-05 ENCOUNTER — Ambulatory Visit
Admission: RE | Admit: 2016-08-05 | Discharge: 2016-08-05 | Disposition: A | Discharge status: Home / Self Care | Payer: PPO | Source: Ambulatory Visit | Attending: General Surgery | Admitting: General Surgery

## 2016-08-05 ENCOUNTER — Ambulatory Visit: Payer: PPO | Admitting: Anesthesiology

## 2016-08-05 ENCOUNTER — Ambulatory Visit: Payer: PPO

## 2016-08-05 ENCOUNTER — Encounter
Admission: RE | Disposition: A | Discharge status: Home / Self Care | Payer: Self-pay | Source: Ambulatory Visit | Attending: General Surgery

## 2016-08-05 ENCOUNTER — Encounter: Payer: Self-pay | Admitting: *Deleted

## 2016-08-05 DIAGNOSIS — C50412 Malignant neoplasm of upper-outer quadrant of left female breast: Secondary | ICD-10-CM

## 2016-08-05 DIAGNOSIS — D649 Anemia, unspecified: Secondary | ICD-10-CM | POA: Diagnosis not present

## 2016-08-05 DIAGNOSIS — Z170 Estrogen receptor positive status [ER+]: Secondary | ICD-10-CM

## 2016-08-05 DIAGNOSIS — Z452 Encounter for adjustment and management of vascular access device: Secondary | ICD-10-CM | POA: Diagnosis not present

## 2016-08-05 DIAGNOSIS — F419 Anxiety disorder, unspecified: Secondary | ICD-10-CM | POA: Diagnosis not present

## 2016-08-05 DIAGNOSIS — I10 Essential (primary) hypertension: Secondary | ICD-10-CM | POA: Diagnosis not present

## 2016-08-05 DIAGNOSIS — C50912 Malignant neoplasm of unspecified site of left female breast: Secondary | ICD-10-CM | POA: Insufficient documentation

## 2016-08-05 DIAGNOSIS — C50919 Malignant neoplasm of unspecified site of unspecified female breast: Secondary | ICD-10-CM | POA: Diagnosis not present

## 2016-08-05 DIAGNOSIS — Z95828 Presence of other vascular implants and grafts: Secondary | ICD-10-CM

## 2016-08-05 HISTORY — PX: PORTACATH PLACEMENT: SHX2246

## 2016-08-05 SURGERY — INSERTION, TUNNELED CENTRAL VENOUS DEVICE, WITH PORT
Anesthesia: General | Laterality: Right | Wound class: Clean

## 2016-08-05 MED ORDER — LIDOCAINE HCL (PF) 1 % IJ SOLN
INTRAMUSCULAR | Status: AC
  Filled 2016-08-05: qty 30

## 2016-08-05 MED ORDER — PROPOFOL 500 MG/50ML IV EMUL
INTRAVENOUS | Status: AC
  Filled 2016-08-05: qty 50

## 2016-08-05 MED ORDER — ONDANSETRON HCL 4 MG/2ML IJ SOLN
INTRAMUSCULAR | Status: DC | PRN
  Administered 2016-08-05: 4 mg via INTRAVENOUS

## 2016-08-05 MED ORDER — MIDAZOLAM HCL 2 MG/2ML IJ SOLN
INTRAMUSCULAR | Status: DC | PRN
  Administered 2016-08-05 (×2): 1 mg via INTRAVENOUS

## 2016-08-05 MED ORDER — FENTANYL CITRATE (PF) 100 MCG/2ML IJ SOLN
INTRAMUSCULAR | Status: AC
  Filled 2016-08-05: qty 2

## 2016-08-05 MED ORDER — PROPOFOL 10 MG/ML IV BOLUS
INTRAVENOUS | Status: AC
  Filled 2016-08-05: qty 20

## 2016-08-05 MED ORDER — SODIUM CHLORIDE 0.9 % IJ SOLN
INTRAMUSCULAR | Status: DC | PRN
  Administered 2016-08-05: 20 mL via INTRAVENOUS

## 2016-08-05 MED ORDER — TRAMADOL HCL 50 MG PO TABS: 50.0000 mg | ORAL_TABLET | ORAL | 0 refills | Status: DC | PRN

## 2016-08-05 MED ORDER — PROPOFOL 500 MG/50ML IV EMUL
INTRAVENOUS | Status: DC | PRN
  Administered 2016-08-05: 20 ug/kg/min via INTRAVENOUS

## 2016-08-05 MED ORDER — ONDANSETRON HCL 4 MG/2ML IJ SOLN: 4.0000 mg | Freq: Once | INTRAMUSCULAR | Status: DC | PRN

## 2016-08-05 MED ORDER — MIDAZOLAM HCL 2 MG/2ML IJ SOLN
INTRAMUSCULAR | Status: AC
  Filled 2016-08-05: qty 2

## 2016-08-05 MED ORDER — KETOROLAC TROMETHAMINE 30 MG/ML IJ SOLN
INTRAMUSCULAR | Status: AC
  Filled 2016-08-05: qty 1

## 2016-08-05 MED ORDER — FENTANYL CITRATE (PF) 100 MCG/2ML IJ SOLN
INTRAMUSCULAR | Status: DC | PRN
  Administered 2016-08-05 (×2): 50 ug via INTRAVENOUS

## 2016-08-05 MED ORDER — ONDANSETRON HCL 4 MG/2ML IJ SOLN
INTRAMUSCULAR | Status: AC
  Filled 2016-08-05: qty 2

## 2016-08-05 MED ORDER — SODIUM CHLORIDE 0.9 % IJ SOLN
INTRAMUSCULAR | Status: AC
  Filled 2016-08-05: qty 50

## 2016-08-05 MED ORDER — FENTANYL CITRATE (PF) 100 MCG/2ML IJ SOLN: 25.0000 ug | INTRAMUSCULAR | Status: DC | PRN

## 2016-08-05 MED ORDER — CLINDAMYCIN PHOSPHATE 900 MG/50ML IV SOLN
INTRAVENOUS | Status: AC
  Filled 2016-08-05: qty 50

## 2016-08-05 MED ORDER — LIDOCAINE HCL (PF) 1 % IJ SOLN
INTRAMUSCULAR | Status: DC | PRN
  Administered 2016-08-05: 10 mL

## 2016-08-05 MED ORDER — LACTATED RINGERS IV SOLN
INTRAVENOUS | Status: DC
  Administered 2016-08-05 (×2): via INTRAVENOUS

## 2016-08-05 SURGICAL SUPPLY — 29 items
BLADE SURG 15 STRL SS SAFETY (BLADE) ×5 IMPLANT
CHLORAPREP W/TINT 26ML (MISCELLANEOUS) ×3 IMPLANT
CLOSURE WOUND 1/2 X4 (GAUZE/BANDAGES/DRESSINGS) ×1
COVER LIGHT HANDLE STERIS (MISCELLANEOUS) ×6 IMPLANT
DECANTER SPIKE VIAL GLASS SM (MISCELLANEOUS) ×6 IMPLANT
DRAPE C-ARM XRAY 36X54 (DRAPES) ×3 IMPLANT
DRAPE LAPAROTOMY TRNSV 106X77 (MISCELLANEOUS) ×3 IMPLANT
DRSG TEGADERM 2-3/8X2-3/4 SM (GAUZE/BANDAGES/DRESSINGS) ×3 IMPLANT
DRSG TEGADERM 4X4.75 (GAUZE/BANDAGES/DRESSINGS) ×3 IMPLANT
DRSG TELFA 4X3 1S NADH ST (GAUZE/BANDAGES/DRESSINGS) ×3 IMPLANT
ELECT REM PT RETURN 9FT ADLT (ELECTROSURGICAL) ×3
ELECTRODE REM PT RTRN 9FT ADLT (ELECTROSURGICAL) ×1 IMPLANT
GLOVE BIO SURGEON STRL SZ7.5 (GLOVE) ×7 IMPLANT
GLOVE INDICATOR 8.0 STRL GRN (GLOVE) ×5 IMPLANT
GOWN STRL REUS W/ TWL LRG LVL3 (GOWN DISPOSABLE) ×2 IMPLANT
GOWN STRL REUS W/TWL LRG LVL3 (GOWN DISPOSABLE) ×6
KIT PORT POWER 8FR ISP CVUE (Catheter) ×3 IMPLANT
KIT RM TURNOVER STRD PROC AR (KITS) ×3 IMPLANT
LABEL OR SOLS (LABEL) ×3 IMPLANT
NS IRRIG 500ML POUR BTL (IV SOLUTION) ×3 IMPLANT
PACK PORT-A-CATH (MISCELLANEOUS) ×3 IMPLANT
STRIP CLOSURE SKIN 1/2X4 (GAUZE/BANDAGES/DRESSINGS) ×2 IMPLANT
SUT PROLENE 3 0 SH DA (SUTURE) ×3 IMPLANT
SUT VIC AB 3-0 SH 27 (SUTURE) ×3
SUT VIC AB 3-0 SH 27X BRD (SUTURE) ×1 IMPLANT
SUT VIC AB 4-0 FS2 27 (SUTURE) ×3 IMPLANT
SWABSTK COMLB BENZOIN TINCTURE (MISCELLANEOUS) ×3 IMPLANT
SYR 10ML SLIP (SYRINGE) ×3 IMPLANT
SYRINGE 10CC LL (SYRINGE) ×2 IMPLANT

## 2016-08-05 NOTE — Anesthesia Post-op Follow-up Note (Cosign Needed)
Anesthesia QCDR form completed.        

## 2016-08-05 NOTE — H&P (Signed)
No change in clinical condition or exam. For right power port placement.  

## 2016-08-05 NOTE — Discharge Instructions (Signed)

## 2016-08-05 NOTE — Anesthesia Postprocedure Evaluation (Signed)
Anesthesia Post Note  Patient: Sandra Fritz  Procedure(s) Performed: Procedure(s) (LRB): INSERTION PORT-A-CATH WITH ULTRASOUND (Right)  Patient location during evaluation: PACU Anesthesia Type: General Level of consciousness: awake and alert Pain management: pain level controlled Vital Signs Assessment: post-procedure vital signs reviewed and stable Respiratory status: spontaneous breathing, nonlabored ventilation, respiratory function stable and patient connected to nasal cannula oxygen Cardiovascular status: blood pressure returned to baseline and stable Postop Assessment: no signs of nausea or vomiting Anesthetic complications: no     Last Vitals:  Vitals:   08/05/16 1148 08/05/16 1220  BP: (!) 160/56 (!) 127/50  Pulse: 80 81  Resp: 18   Temp: 36.6 C     Last Pain:  Vitals:   08/05/16 1148  TempSrc: Oral  PainSc:                  Martha Clan

## 2016-08-05 NOTE — Anesthesia Preprocedure Evaluation (Signed)
Anesthesia Evaluation  Patient identified by MRN, date of birth, ID band Patient awake    Reviewed: Allergy & Precautions, H&P , NPO status , Patient's Chart, lab work & pertinent test results, reviewed documented beta blocker date and time   History of Anesthesia Complications (+) PROLONGED EMERGENCE and history of anesthetic complications  Airway Mallampati: I  TM Distance: >3 FB Neck ROM: full    Dental  (+) Implants, Missing, Edentulous Upper, Upper Dentures, Dental Advidsory Given   Pulmonary neg pulmonary ROS,           Cardiovascular Exercise Tolerance: Good hypertension, (-) angina(-) CAD, (-) Past MI, (-) Cardiac Stents and (-) CABG (-) dysrhythmias (-) Valvular Problems/Murmurs     Neuro/Psych neg Seizures  Neuromuscular disease negative psych ROS   GI/Hepatic Neg liver ROS, hiatal hernia, PUD, GERD  ,  Endo/Other  negative endocrine ROS  Renal/GU negative Renal ROS  negative genitourinary   Musculoskeletal   Abdominal   Peds  Hematology negative hematology ROS (+)   Anesthesia Other Findings Past Medical History: No date: Anemia No date: Anxiety No date: Arthritis No date: Blood transfusion without reported diagnosis No date: Cataract No date: Complication of anesthesia     Comment: HARD TO WAKE UP AFTER TONSILLECTOMY No date: GERD (gastroesophageal reflux disease) No date: Hyperlipidemia No date: Hypertension No date: Neuromuscular disorder ([place]) 07/28/2016: Primary cancer of upper outer quadrant of left*   Reproductive/Obstetrics negative OB ROS                             Anesthesia Physical Anesthesia Plan  ASA: III  Anesthesia Plan: General   Post-op Pain Management:    Induction:   Airway Management Planned:   Additional Equipment:   Intra-op Plan:   Post-operative Plan:   Informed Consent: I have reviewed the patients History and Physical, chart, labs  and discussed the procedure including the risks, benefits and alternatives for the proposed anesthesia with the patient or authorized representative who has indicated his/her understanding and acceptance.   Dental Advisory Given  Plan Discussed with: Anesthesiologist, CRNA and Surgeon  Anesthesia Plan Comments:         Anesthesia Quick Evaluation

## 2016-08-05 NOTE — OR Nursing (Signed)
Dr. Bary Castilla in to see pt @ 1234 pm Pt given ice pack for home use

## 2016-08-05 NOTE — Op Note (Signed)
Preoperative diagnosis: Left breast cancer requiring neoadjuvant chemotherapy.  Postoperative diagnosis: Same.  Operative procedure: Right subclavian PowerPort placement with ultrasound and fluoroscopic guidance.  Operating surgeon: Hervey Ard, M.D.  Anesthesia: Attended local, 10 cc 1% Xylocaine plain.  Estimated blood loss: 5 cc.  Clinical note: This 82 year old woman is a candidate for neoadjuvant chemotherapy for her left breast cancer. Central venous access was requested by her treating oncologist.  The patient received clindamycin prior to the procedure due to a reported allergy to cephalosporins.  Operative note: With the patient under adequate general anesthesia the right chest and neck and axilla was prepped with ChloraPrep and draped. Ultrasound was used to confirm patency of the subclavian vein. This was cannulated under ultrasound guidance. After instillation of local anesthetic. The guidewire was advanced followed by the dilator and placement of the catheter. Fluoroscopy was used to position the catheter tip at the junction of the SVC and right atrium. This was then tunneled to a pocket on the right anterior chest. The port was anchored to the deep fascia with 3-0 Prolene sutures. The catheter easily irrigated and aspirated in this location. The wound was closed in layers with a running 3-0 Vicryl suture to the adipose layer followed by running 4-0 Vicryl subcuticular suture for the skin. Benzoin, Steri-Strips, Telfa and Tegaderm dressings were applied.  Direct portable chest x-ray in the recovery room showed no evidence of pneumothorax and catheter tip as described above. The patient was taken to the recovery room in good condition.

## 2016-08-05 NOTE — Transfer of Care (Signed)
Immediate Anesthesia Transfer of Care Note  Patient: Sandra Fritz  Procedure(s) Performed: Procedure(s): INSERTION PORT-A-CATH WITH ULTRASOUND (Right)  Patient Location: PACU  Anesthesia Type:General  Level of Consciousness: sedated  Airway & Oxygen Therapy: Patient Spontanous Breathing and Patient connected to nasal cannula oxygen  Post-op Assessment: Report given to RN and Post -op Vital signs reviewed and stable  Post vital signs: Reviewed and stable  Last Vitals:  Vitals:   08/05/16 0828 08/05/16 1103  BP: (!) 165/54 (!) 162/64  Pulse: 81 80  Resp: 18 11  Temp: 36.5 C 36.6 C    Last Pain:  Vitals:   08/05/16 1103  TempSrc:   PainSc: 0-No pain         Complications: No apparent anesthesia complications

## 2016-08-06 ENCOUNTER — Emergency Department (HOSPITAL_COMMUNITY): Payer: PPO

## 2016-08-06 ENCOUNTER — Encounter (HOSPITAL_COMMUNITY): Payer: Self-pay

## 2016-08-06 ENCOUNTER — Emergency Department (HOSPITAL_COMMUNITY)
Admission: EM | Admit: 2016-08-06 | Discharge: 2016-08-07 | Disposition: A | Discharge status: Home / Self Care | Payer: PPO | Attending: Emergency Medicine | Admitting: Emergency Medicine

## 2016-08-06 DIAGNOSIS — S0181XA Laceration without foreign body of other part of head, initial encounter: Secondary | ICD-10-CM | POA: Diagnosis not present

## 2016-08-06 DIAGNOSIS — Y939 Activity, unspecified: Secondary | ICD-10-CM | POA: Diagnosis not present

## 2016-08-06 DIAGNOSIS — S06360A Traumatic hemorrhage of cerebrum, unspecified, without loss of consciousness, initial encounter: Secondary | ICD-10-CM | POA: Insufficient documentation

## 2016-08-06 DIAGNOSIS — S06300A Unspecified focal traumatic brain injury without loss of consciousness, initial encounter: Secondary | ICD-10-CM | POA: Diagnosis not present

## 2016-08-06 DIAGNOSIS — S0232XA Fracture of orbital floor, left side, initial encounter for closed fracture: Secondary | ICD-10-CM

## 2016-08-06 DIAGNOSIS — W228XXA Striking against or struck by other objects, initial encounter: Secondary | ICD-10-CM | POA: Insufficient documentation

## 2016-08-06 DIAGNOSIS — S0282XA Fracture of other specified skull and facial bones, left side, initial encounter for closed fracture: Secondary | ICD-10-CM | POA: Diagnosis not present

## 2016-08-06 DIAGNOSIS — Y999 Unspecified external cause status: Secondary | ICD-10-CM | POA: Diagnosis not present

## 2016-08-06 DIAGNOSIS — S1191XA Laceration without foreign body of unspecified part of neck, initial encounter: Secondary | ICD-10-CM | POA: Diagnosis not present

## 2016-08-06 DIAGNOSIS — S01511A Laceration without foreign body of lip, initial encounter: Secondary | ICD-10-CM | POA: Diagnosis not present

## 2016-08-06 DIAGNOSIS — Z79899 Other long term (current) drug therapy: Secondary | ICD-10-CM | POA: Insufficient documentation

## 2016-08-06 DIAGNOSIS — I629 Nontraumatic intracranial hemorrhage, unspecified: Secondary | ICD-10-CM

## 2016-08-06 DIAGNOSIS — S0191XA Laceration without foreign body of unspecified part of head, initial encounter: Secondary | ICD-10-CM | POA: Diagnosis not present

## 2016-08-06 DIAGNOSIS — S0993XA Unspecified injury of face, initial encounter: Secondary | ICD-10-CM | POA: Diagnosis not present

## 2016-08-06 DIAGNOSIS — Z7982 Long term (current) use of aspirin: Secondary | ICD-10-CM | POA: Insufficient documentation

## 2016-08-06 DIAGNOSIS — I10 Essential (primary) hypertension: Secondary | ICD-10-CM | POA: Diagnosis not present

## 2016-08-06 DIAGNOSIS — Y929 Unspecified place or not applicable: Secondary | ICD-10-CM | POA: Insufficient documentation

## 2016-08-06 DIAGNOSIS — S0010XA Contusion of unspecified eyelid and periocular area, initial encounter: Secondary | ICD-10-CM | POA: Diagnosis not present

## 2016-08-06 DIAGNOSIS — S098XXA Other specified injuries of head, initial encounter: Secondary | ICD-10-CM | POA: Diagnosis not present

## 2016-08-06 LAB — COMPREHENSIVE METABOLIC PANEL
ALK PHOS: 54 U/L (ref 38–126)
ALT: 17 U/L (ref 14–54)
AST: 27 U/L (ref 15–41)
Albumin: 3.5 g/dL (ref 3.5–5.0)
Anion gap: 9 (ref 5–15)
BILIRUBIN TOTAL: 0.5 mg/dL (ref 0.3–1.2)
BUN: 23 mg/dL — ABNORMAL HIGH (ref 6–20)
CALCIUM: 9.2 mg/dL (ref 8.9–10.3)
CO2: 27 mmol/L (ref 22–32)
CREATININE: 1.15 mg/dL — AB (ref 0.44–1.00)
Chloride: 103 mmol/L (ref 101–111)
GFR, EST AFRICAN AMERICAN: 51 mL/min — AB (ref >60)
GFR, EST NON AFRICAN AMERICAN: 44 mL/min — AB (ref >60)
Glucose, Bld: 125 mg/dL — ABNORMAL HIGH (ref 65–99)
Potassium: 3.7 mmol/L (ref 3.5–5.1)
SODIUM: 139 mmol/L (ref 135–145)
TOTAL PROTEIN: 6.6 g/dL (ref 6.5–8.1)

## 2016-08-06 LAB — CBC WITH DIFFERENTIAL/PLATELET
BASOS PCT: 2 %
Basophils Absolute: 0.1 10*3/uL (ref 0.0–0.1)
EOS ABS: 0.3 10*3/uL (ref 0.0–0.7)
EOS PCT: 5 %
HCT: 32 % — ABNORMAL LOW (ref 36.0–46.0)
HEMOGLOBIN: 10.2 g/dL — AB (ref 12.0–15.0)
LYMPHS PCT: 15 %
Lymphs Abs: 1 10*3/uL (ref 0.7–4.0)
MCH: 27.1 pg (ref 26.0–34.0)
MCHC: 31.9 g/dL (ref 30.0–36.0)
MCV: 84.9 fL (ref 78.0–100.0)
Monocytes Absolute: 0.1 10*3/uL (ref 0.1–1.0)
Monocytes Relative: 1 %
NEUTROS PCT: 77 %
Neutro Abs: 5.3 10*3/uL (ref 1.7–7.7)
Platelets: 272 10*3/uL (ref 150–400)
RBC: 3.77 MIL/uL — ABNORMAL LOW (ref 3.87–5.11)
RDW: 15.3 % (ref 11.5–15.5)
WBC: 6.8 10*3/uL (ref 4.0–10.5)

## 2016-08-06 LAB — RAPID URINE DRUG SCREEN, HOSP PERFORMED
AMPHETAMINES: NOT DETECTED
BARBITURATES: NOT DETECTED
BENZODIAZEPINES: POSITIVE — AB
Cocaine: NOT DETECTED
Opiates: NOT DETECTED
Tetrahydrocannabinol: NOT DETECTED

## 2016-08-06 LAB — URINALYSIS, ROUTINE W REFLEX MICROSCOPIC
BILIRUBIN URINE: NEGATIVE
Glucose, UA: NEGATIVE mg/dL
HGB URINE DIPSTICK: NEGATIVE
Ketones, ur: NEGATIVE mg/dL
Leukocytes, UA: NEGATIVE
Nitrite: NEGATIVE
PROTEIN: NEGATIVE mg/dL
Specific Gravity, Urine: 1.018 (ref 1.005–1.030)
pH: 5 (ref 5.0–8.0)

## 2016-08-06 LAB — ETHANOL: Alcohol, Ethyl (B): <5 mg/dL (ref <5)

## 2016-08-06 LAB — PROTIME-INR
INR: 0.96
Prothrombin Time: 12.8 seconds (ref 11.4–15.2)

## 2016-08-06 MED ORDER — TETANUS-DIPHTH-ACELL PERTUSSIS 5-2.5-18.5 LF-MCG/0.5 IM SUSP
0.5000 mL | Freq: Once | INTRAMUSCULAR | Status: AC
  Administered 2016-08-06: 0.5 mL via INTRAMUSCULAR
  Filled 2016-08-06: qty 0.5

## 2016-08-06 MED ORDER — LIDOCAINE-EPINEPHRINE (PF) 2 %-1:200000 IJ SOLN
10.0000 mL | Freq: Once | INTRAMUSCULAR | Status: AC
  Administered 2016-08-06: 10 mL via INTRADERMAL
  Filled 2016-08-06: qty 20

## 2016-08-06 MED ORDER — FENTANYL CITRATE (PF) 100 MCG/2ML IJ SOLN
25.0000 ug | Freq: Once | INTRAMUSCULAR | Status: AC
  Administered 2016-08-06: 25 ug via INTRAVENOUS
  Filled 2016-08-06: qty 2

## 2016-08-06 MED ORDER — FENTANYL CITRATE (PF) 100 MCG/2ML IJ SOLN
50.0000 ug | Freq: Once | INTRAMUSCULAR | Status: AC
  Administered 2016-08-06: 50 ug via INTRAVENOUS
  Filled 2016-08-06: qty 2

## 2016-08-06 NOTE — ED Provider Notes (Signed)
[facility] DEPT Provider Note   CSN: [id number] Arrival date & time: 08/06/16  1617     History   Chief Complaint Chief Complaint  Patient presents with  . Fall  . Altered Mental Status    HPI Sandra Fritz is a 82 y.o. female.  HPI Level V caveat due to altered mental status. Brought in by EMS after fall at a wedding. Unsure about the events surrounding the fall. Patient struck her face and had on concrete. Witnesses reported loss of consciousness. Patient sustained lacerations to the left forehead and left lower lip. Cervical collar was placed. EMS reports one episode of vomiting. Vital signs stable throughout. Past Medical History:  Diagnosis Date  . Anemia   . Anxiety   . Arthritis   . Blood transfusion without reported diagnosis   . Cataract   . Complication of anesthesia    HARD TO WAKE UP AFTER TONSILLECTOMY  . GERD (gastroesophageal reflux disease)   . Hyperlipidemia   . Hypertension   . Neuromuscular disorder ([place])   . Primary cancer of upper outer quadrant of left female breast ([place]) 07/28/2016    Patient Active Problem List   Diagnosis Date Noted  . Primary cancer of upper outer quadrant of left female breast ([place]) 07/28/2016  . Left breast mass 07/25/2016  . Abnormal finding on mammography 07/25/2016  . Hyperglycemia 04/12/2016  . Absolute anemia 07/25/2014  . Anxiety 07/25/2014  . AB (asthmatic bronchitis) 07/25/2014  . 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 Intervertebral cervical disc disorder with myelopathy, cervical region 07/25/2014  . Chest pain 07/25/2014  . Breath shortness 07/25/2014  . Edema leg 07/25/2014  . Fatigue 07/25/2014  . Acid reflux 07/25/2014  . 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 Arthralgia of hip 07/25/2014  . HLD (hyperlipidemia) 07/25/2014  . Below normal amount of sodium in the blood 07/25/2014  . Allergic state 07/25/2014  . Neuropathy 07/25/2014  . Arthritis, degenerative 07/25/2014  . Peptic ulcer  07/25/2014  . FOOT, PAIN 10/12/2007  . Hyperlipidemia 09/18/2007  . Hypertension 09/18/2007  . PEPTIC ULCER DISEASE 09/18/2007  . PLANTAR FASCIITIS, LEFT 09/18/2007    Past Surgical History:  Procedure Laterality Date  . APPENDECTOMY  1955   SECONDARY TO RUTURED APPENDIX  . CATARACT EXTRACTION, BILATERAL  2011  . COLONOSCOPY  2002  . DILATION AND CURETTAGE OF UTERUS  1989   Dr. Laurey Morale  . JOINT REPLACEMENT     Right hip  . MOLE REMOVAL  1957   18 removed by Dr. Hoyle Sauer  . NECK SURGERY    . PORTACATH PLACEMENT Right 08/05/2016   Procedure: INSERTION PORT-A-CATH WITH ULTRASOUND;  Surgeon: Robert Bellow, MD;  Location: [facility] ORS;  Service: General;  Laterality: Right;  . TONSILLECTOMY  1955    OB History    Gravida Para Term Preterm AB Living   0 0 0 0 0 0   SAB TAB Ectopic Multiple Live Births   0 0 0 0 0      Obstetric Comments   1st Menstrual Cycle:  14 1st Pregnancy:         Home Medications    Prior to Admission medications   Medication Sig Start Date End Date Taking? Authorizing Provider  aspirin 81 MG tablet Take 81 mg by mouth daily.  11/13/13  Yes [provider]  clobetasol cream (TEMOVATE) 2.11 % Apply 1 application topically 2 (two) times daily. ON ARMS   Yes [provider]  esomeprazole (NEXIUM) 20 MG capsule  Take 1 capsule (20 mg total) by mouth daily. 07/07/16  Yes Jerrol Banana., MD  furosemide (LASIX) 20 MG tablet TAKE 1 TABLET (20 MG TOTAL) BY MOUTH 2 (TWO) TIMES DAILY. Patient taking differently: Take 20 mg by mouth 2 (two) times daily. Takes 1 daily and and 2nd dose only if needed for fluid retention 04/14/16  Yes Jerrol Banana., MD  hydrocortisone 2.5 % cream Apply 1 application topically 2 (two) times daily. ON FACE   Yes [provider]  loratadine (CLARITIN) 10 MG tablet Take 10 mg by mouth daily as needed.    Yes [provider]  losartan-hydrochlorothiazide (HYZAAR) 100-12.5 MG tablet  Take 1 tablet by mouth daily. Patient taking differently: Take 1 tablet by mouth daily at 6 PM.  05/10/16  Yes Jerrol Banana., MD  Misc Natural Products (OSTEO BI-FLEX JOINT SHIELD PO) Take 2 tablets by mouth daily.   Yes [provider]  Multiple Vitamin (MULTIVITAMIN WITH MINERALS) TABS tablet Take 1 tablet by mouth 3 (three) times a week.   Yes [provider]  rosuvastatin (CRESTOR) 5 MG tablet Take 1 tablet (5 mg total) by mouth every other day. Patient taking differently: Take 5 mg by mouth every other day. AFTER LUNCH 04/18/16  Yes Jerrol Banana., MD  clindamycin (CLEOCIN) 300 MG capsule Take 1 capsule (300 mg total) by mouth 4 (four) times daily. X 7 days 08/06/16   Julianne Rice, MD  lidocaine-prilocaine (EMLA) cream Apply to affected area once 07/28/16   Lloyd Huger, MD  ondansetron (ZOFRAN) 8 MG tablet Take 1 tablet (8 mg total) by mouth 2 (two) times daily as needed. Start on the third day after chemotherapy. 07/28/16   Lloyd Huger, MD  prochlorperazine (COMPAZINE) 10 MG tablet Take 1 tablet (10 mg total) by mouth every 6 (six) hours as needed (Nausea or vomiting). 07/28/16   Lloyd Huger, MD  sucralfate (CARAFATE) 1 g tablet Take 1 tablet (1 g total) by mouth 4 (four) times daily -  with meals and at bedtime. Patient not taking: Reported on 08/01/2016 04/12/16   Jerrol Banana., MD  traMADol (ULTRAM) 50 MG tablet Take 1 tablet (50 mg total) by mouth every 6 (six) hours as needed. 08/06/16   Julianne Rice, MD    Family History Family History  Problem Relation Age of Onset  . Macular degeneration Mother   . Hypertension Mother   . Diabetes Father   . Heart disease Father   . Heart attack Father 62  . Heart disease Brother 55  . Dermatomyositis Sister   . Cancer Sister        Carcinoma of the uterine call attached to colon and is in remission now  . Breast cancer Maternal Aunt 70  . Breast cancer Paternal Aunt 78  .  Breast cancer Cousin        paternal aunt w/ breast ca daughter  . Breast cancer Maternal Aunt 60    Social History Social History  Substance Use Topics  . Smoking status: Never Smoker  . Smokeless tobacco: Never Used  . Alcohol use No     Comment: Maybe 3 per year     Allergies   Keflex  [cephalexin]; Statins; and Erythromycin   Review of Systems Review of Systems  Unable to perform ROS: Mental status change  Neurological: Positive for syncope.     Physical Exam Updated Vital Signs BP (!) 160/69  Pulse 93   Temp 97.8 F (36.6 C) (Oral)   Resp 20   Ht 5\' 9"  (1.753 m)   Wt 79.8 kg (176 lb)   SpO2 97%   BMI 25.99 kg/m   Physical Exam  Constitutional: She appears well-developed and well-nourished.  HENT:  Head: Normocephalic.  Mouth/Throat: Oropharynx is clear and moist.  Patient has a forceful manner linear laceration superior to the left eyebrow. No active bleeding. Has periorbital edema and contusion to the left mid face. There is a 170 laceration to the left lower lip appears to involve the vermilion border. No malocclusion.  Eyes: EOM are normal. Pupils are equal, round, and reactive to light.  Left eye with medial conjunctival hematoma. No hyphema visualized. Pupils round and reactive  Neck:  Cervical collar in place.  Cardiovascular: Normal rate and regular rhythm.  Exam reveals no gallop and no friction rub.   No murmur heard. Pulmonary/Chest: Effort normal and breath sounds normal.  Abdominal: Soft. Bowel sounds are normal. There is no tenderness. There is no rebound and no guarding.  Musculoskeletal: Normal range of motion. She exhibits no edema or tenderness.  Neurological: She is alert.  Patient is oriented only to self. Repetitive questioning. 5/5 motor in bilateral upper and lower extremities. Sensation to light touch is intact.  Skin: Skin is warm and dry. Capillary refill takes less than 2 seconds. No rash noted. No erythema.  Psychiatric: Her  behavior is normal.  Anxious  Nursing note and vitals reviewed.    ED Treatments / Results  Labs (all labs ordered are listed, but only abnormal results are displayed) Labs Reviewed  CBC WITH DIFFERENTIAL/PLATELET - Abnormal; Notable for the following:       Result Value   RBC 3.77 (*)    Hemoglobin 10.2 (*)    HCT 32.0 (*)    All other components within normal limits  COMPREHENSIVE METABOLIC PANEL - Abnormal; Notable for the following:    Glucose, Bld 125 (*)    BUN 23 (*)    Creatinine, Ser 1.15 (*)    GFR calc non Af Amer 44 (*)    GFR calc Af Amer 51 (*)    All other components within normal limits  RAPID URINE DRUG SCREEN, HOSP PERFORMED - Abnormal; Notable for the following:    Benzodiazepines POSITIVE (*)    All other components within normal limits  PROTIME-INR  ETHANOL  URINALYSIS, ROUTINE W REFLEX MICROSCOPIC    EKG  EKG Interpretation None       Radiology Ct Head Wo Contrast  Result Date: 08/06/2016 CLINICAL DATA:  Patient had a witnessed fall, + LOC, and laceration over left orbit. Patient complains of head, neck and jaw pain. She does not remember what happened EXAM: CT HEAD WITHOUT CONTRAST CT MAXILLOFACIAL WITHOUT CONTRAST CT CERVICAL SPINE WITHOUT CONTRAST TECHNIQUE: Multidetector CT imaging of the head, cervical spine, and maxillofacial structures were performed using the standard protocol without intravenous contrast. Multiplanar CT image reconstructions of the cervical spine and maxillofacial structures were also generated. COMPARISON:  None. FINDINGS: CT HEAD FINDINGS Brain: There is a small focus of hyperattenuation within the sub cortical white matter of the superior medial left frontal lobe, measuring 4 mm in size, consistent with a small parenchymal contusion. No other evidence of intracranial hemorrhage. Ventricles are normal in configuration. There is ventricular sulcal enlargement reflecting age related volume loss. No hydrocephalus. There are no  parenchymal masses or mass effect. There is no evidence of an infarct. Mild  patchy white matter hypoattenuation is noted consistent with chronic microvascular ischemic change. There are no extra-axial masses or abnormal fluid collections. Vascular: No hyperdense vessel or unexpected calcification. Skull: Normal. Negative for fracture or focal lesion. Other: None. CT MAXILLOFACIAL FINDINGS Osseous: There is a fracture of the mid to posterior aspect of the left orbital floor with no significant depression or displacement. Small amount of extraconal intraorbital air lies adjacent to the fracture. Cell no other fractures. Orbits: Mild preseptal left periorbital soft tissue swelling/ hemorrhage. Small laceration to the left supraorbital forehead. No radiopaque foreign body. Unremarkable globes other than changes from cataract surgery. The postseptal orbits are unremarkable other than the small amount of extraconal air noted on the left associated with the orbital floor fracture. Sinuses: Left maxillary sinuses partly filled with dependent hemorrhage. Remaining visualized sinuses are clear. Clear mastoid air cells and middle ear cavities. Soft tissues: No other soft tissue abnormality. No soft tissue masses or adenopathy. CT CERVICAL SPINE FINDINGS Alignment: Mild reversal the normal cervical lordosis. No spondylolisthesis. Skull base and vertebrae: No fracture. No osteoblastic or osteolytic lesions. Status post anterior cervical spine fusion from C3 through C6 with an anterior fusion plate, fixation screws and metal intervertebral spacers/cages. The orthopedic hardware appears well seated with no evidence of loosening. Soft tissues and spinal canal: No prevertebral fluid or swelling. No visible canal hematoma. Disc levels: There is moderate loss of disc height with endplate spurring at the C6-C7 level. Endplate spurring along the mid to lower cervical spine these 2 at least mild degrees of central stenosis, which appears  chronic. No convincing disc herniation. Neural foramina are well preserved. Upper chest: No acute findings. Thyroid is enlarged with multiple small nodules. Other: None. IMPRESSION: HEAD CT 1. Small, 4 mm, focus of parenchymal hemorrhage in the superior medial left frontal lobe subcortical white matter. 2. No other acute intracranial abnormality. 3. No skull fracture. MAXILLOFACIAL CT 1. Nondisplaced left orbital floor fracture associated with the left maxillary sinus hemorrhage. No herniation of the orbital contents or entrapment of the extra-ocular musculature. No intraorbital hematoma or inflammation. 2. No other fractures. 3. Left supraorbital forehead laceration and mild left preseptal periorbital soft tissue swelling. CERVICAL CT 1. No fracture or acute finding. 2. No evidence of loosening of the cervical spine fusion hardware. Electronically Signed   By: Lajean Manes M.D.   On: 08/06/2016 17:20   Ct Cervical Spine Wo Contrast  Result Date: 08/06/2016 CLINICAL DATA:  Patient had a witnessed fall, + LOC, and laceration over left orbit. Patient complains of head, neck and jaw pain. She does not remember what happened EXAM: CT HEAD WITHOUT CONTRAST CT MAXILLOFACIAL WITHOUT CONTRAST CT CERVICAL SPINE WITHOUT CONTRAST TECHNIQUE: Multidetector CT imaging of the head, cervical spine, and maxillofacial structures were performed using the standard protocol without intravenous contrast. Multiplanar CT image reconstructions of the cervical spine and maxillofacial structures were also generated. COMPARISON:  None. FINDINGS: CT HEAD FINDINGS Brain: There is a small focus of hyperattenuation within the sub cortical white matter of the superior medial left frontal lobe, measuring 4 mm in size, consistent with a small parenchymal contusion. No other evidence of intracranial hemorrhage. Ventricles are normal in configuration. There is ventricular sulcal enlargement reflecting age related volume loss. No hydrocephalus.  There are no parenchymal masses or mass effect. There is no evidence of an infarct. Mild patchy white matter hypoattenuation is noted consistent with chronic microvascular ischemic change. There are no extra-axial masses or abnormal fluid collections. Vascular: No hyperdense  vessel or unexpected calcification. Skull: Normal. Negative for fracture or focal lesion. Other: None. CT MAXILLOFACIAL FINDINGS Osseous: There is a fracture of the mid to posterior aspect of the left orbital floor with no significant depression or displacement. Small amount of extraconal intraorbital air lies adjacent to the fracture. Cell no other fractures. Orbits: Mild preseptal left periorbital soft tissue swelling/ hemorrhage. Small laceration to the left supraorbital forehead. No radiopaque foreign body. Unremarkable globes other than changes from cataract surgery. The postseptal orbits are unremarkable other than the small amount of extraconal air noted on the left associated with the orbital floor fracture. Sinuses: Left maxillary sinuses partly filled with dependent hemorrhage. Remaining visualized sinuses are clear. Clear mastoid air cells and middle ear cavities. Soft tissues: No other soft tissue abnormality. No soft tissue masses or adenopathy. CT CERVICAL SPINE FINDINGS Alignment: Mild reversal the normal cervical lordosis. No spondylolisthesis. Skull base and vertebrae: No fracture. No osteoblastic or osteolytic lesions. Status post anterior cervical spine fusion from C3 through C6 with an anterior fusion plate, fixation screws and metal intervertebral spacers/cages. The orthopedic hardware appears well seated with no evidence of loosening. Soft tissues and spinal canal: No prevertebral fluid or swelling. No visible canal hematoma. Disc levels: There is moderate loss of disc height with endplate spurring at the C6-C7 level. Endplate spurring along the mid to lower cervical spine these 2 at least mild degrees of central stenosis,  which appears chronic. No convincing disc herniation. Neural foramina are well preserved. Upper chest: No acute findings. Thyroid is enlarged with multiple small nodules. Other: None. IMPRESSION: HEAD CT 1. Small, 4 mm, focus of parenchymal hemorrhage in the superior medial left frontal lobe subcortical white matter. 2. No other acute intracranial abnormality. 3. No skull fracture. MAXILLOFACIAL CT 1. Nondisplaced left orbital floor fracture associated with the left maxillary sinus hemorrhage. No herniation of the orbital contents or entrapment of the extra-ocular musculature. No intraorbital hematoma or inflammation. 2. No other fractures. 3. Left supraorbital forehead laceration and mild left preseptal periorbital soft tissue swelling. CERVICAL CT 1. No fracture or acute finding. 2. No evidence of loosening of the cervical spine fusion hardware. Electronically Signed   By: Lajean Manes M.D.   On: 08/06/2016 17:20   Ct Maxillofacial Wo Contrast  Result Date: 08/06/2016 CLINICAL DATA:  Patient had a witnessed fall, + LOC, and laceration over left orbit. Patient complains of head, neck and jaw pain. She does not remember what happened EXAM: CT HEAD WITHOUT CONTRAST CT MAXILLOFACIAL WITHOUT CONTRAST CT CERVICAL SPINE WITHOUT CONTRAST TECHNIQUE: Multidetector CT imaging of the head, cervical spine, and maxillofacial structures were performed using the standard protocol without intravenous contrast. Multiplanar CT image reconstructions of the cervical spine and maxillofacial structures were also generated. COMPARISON:  None. FINDINGS: CT HEAD FINDINGS Brain: There is a small focus of hyperattenuation within the sub cortical white matter of the superior medial left frontal lobe, measuring 4 mm in size, consistent with a small parenchymal contusion. No other evidence of intracranial hemorrhage. Ventricles are normal in configuration. There is ventricular sulcal enlargement reflecting age related volume loss. No  hydrocephalus. There are no parenchymal masses or mass effect. There is no evidence of an infarct. Mild patchy white matter hypoattenuation is noted consistent with chronic microvascular ischemic change. There are no extra-axial masses or abnormal fluid collections. Vascular: No hyperdense vessel or unexpected calcification. Skull: Normal. Negative for fracture or focal lesion. Other: None. CT MAXILLOFACIAL FINDINGS Osseous: There is a fracture of the mid  to posterior aspect of the left orbital floor with no significant depression or displacement. Small amount of extraconal intraorbital air lies adjacent to the fracture. Cell no other fractures. Orbits: Mild preseptal left periorbital soft tissue swelling/ hemorrhage. Small laceration to the left supraorbital forehead. No radiopaque foreign body. Unremarkable globes other than changes from cataract surgery. The postseptal orbits are unremarkable other than the small amount of extraconal air noted on the left associated with the orbital floor fracture. Sinuses: Left maxillary sinuses partly filled with dependent hemorrhage. Remaining visualized sinuses are clear. Clear mastoid air cells and middle ear cavities. Soft tissues: No other soft tissue abnormality. No soft tissue masses or adenopathy. CT CERVICAL SPINE FINDINGS Alignment: Mild reversal the normal cervical lordosis. No spondylolisthesis. Skull base and vertebrae: No fracture. No osteoblastic or osteolytic lesions. Status post anterior cervical spine fusion from C3 through C6 with an anterior fusion plate, fixation screws and metal intervertebral spacers/cages. The orthopedic hardware appears well seated with no evidence of loosening. Soft tissues and spinal canal: No prevertebral fluid or swelling. No visible canal hematoma. Disc levels: There is moderate loss of disc height with endplate spurring at the C6-C7 level. Endplate spurring along the mid to lower cervical spine these 2 at least mild degrees of  central stenosis, which appears chronic. No convincing disc herniation. Neural foramina are well preserved. Upper chest: No acute findings. Thyroid is enlarged with multiple small nodules. Other: None. IMPRESSION: HEAD CT 1. Small, 4 mm, focus of parenchymal hemorrhage in the superior medial left frontal lobe subcortical white matter. 2. No other acute intracranial abnormality. 3. No skull fracture. MAXILLOFACIAL CT 1. Nondisplaced left orbital floor fracture associated with the left maxillary sinus hemorrhage. No herniation of the orbital contents or entrapment of the extra-ocular musculature. No intraorbital hematoma or inflammation. 2. No other fractures. 3. Left supraorbital forehead laceration and mild left preseptal periorbital soft tissue swelling. CERVICAL CT 1. No fracture or acute finding. 2. No evidence of loosening of the cervical spine fusion hardware. Electronically Signed   By: Lajean Manes M.D.   On: 08/06/2016 17:20    Procedures Procedures (including critical care time)  Medications Ordered in ED Medications  Tdap (BOOSTRIX) injection 0.5 mL (0.5 mLs Intramuscular Given 08/06/16 1708)  fentaNYL (SUBLIMAZE) injection 25 mcg (25 mcg Intravenous Given 08/06/16 1804)  fentaNYL (SUBLIMAZE) injection 50 mcg (50 mcg Intravenous Given 08/06/16 2020)  lidocaine-EPINEPHrine (XYLOCAINE W/EPI) 2 %-1:200000 (PF) injection 10 mL (10 mLs Intradermal Given 08/06/16 2025)   CRITICAL CARE Performed by: Lita Mains, Shulem Mader Total critical care time: 35 minutes Critical care time was exclusive of separately billable procedures and treating other patients. Critical care was necessary to treat or prevent imminent or life-threatening deterioration. Critical care was time spent personally by me on the following activities: development of treatment plan with patient and/or surrogate as well as nursing, discussions with consultants, evaluation of patient's response to treatment, examination of patient, obtaining  history from patient or surrogate, ordering and performing treatments and interventions, ordering and review of laboratory studies, ordering and review of radiographic studies, pulse oximetry and re-evaluation of patient's condition.  Initial Impression / Assessment and Plan / ED Course  I have reviewed the triage vital signs and the nursing notes.  Pertinent labs & imaging results that were available during my care of the patient were reviewed by me and considered in my medical decision making (see chart for details).    Small punctate intracerebral hemorrhage in the left frontal lobe. She also has  evidence of orbital floor fracture on the left. Discussed with Dr. Ronnald Ramp. Does not believe that admission is required. Simply requests an observation for 24 hours by friends or family. Patient has friends in the room and stay with her. Patient is requesting to be discharged home. She is back to her normal mental status. Has a normal neurologic exam. Left forehead laceration was repaired. See PA note. Place on precautions for orbital floor fracture and given prophylactic antibiotics. Strict return precautions have been given. Final Clinical Impressions(s) / ED Diagnoses   Final diagnoses:  Intracranial hemorrhage ([place])  Fracture of orbital floor, left side, initial encounter for closed fracture [facility])  Facial laceration, initial encounter    New Prescriptions Discharge Medication List as of 08/07/2016 12:01 AM    START taking these medications   Details  clindamycin (CLEOCIN) 300 MG capsule Take 1 capsule (300 mg total) by mouth 4 (four) times daily. X 7 days, Starting Sat 08/06/2016, Print         Julianne Rice, MD 08/08/16 1451

## 2016-08-06 NOTE — ED Notes (Addendum)
Removed patient's head wrap, not oozing or bleeding at this time.  Cleaned up patient's face with peroxide, provided wound care.

## 2016-08-06 NOTE — ED Notes (Signed)
ED Provider at bedside. 

## 2016-08-06 NOTE — ED Notes (Signed)
Ambulated pt. In hallway, no dizziness or SOB, but stated her "head felt tight" and grabbed the door frame upon reentering room, but was consistent and walked quickly.

## 2016-08-06 NOTE — ED Triage Notes (Signed)
Pt. Coming from [place] via [place] for fall on concrete today. Witnesses report LOC. Pt. Only alert to person and has repetitive questioning. EDP at bedside. ~4cm lac noted above left eye. Bleeding controlled at this time. Pt. Reports 10/10 pain to head. EMS reports EKG RBB with NSR. Pt. Did have one episode vomiting. Pt. Transported to head CT at this time by RN and tech.

## 2016-08-07 MED ORDER — TRAMADOL HCL 50 MG PO TABS: 50.0000 mg | ORAL_TABLET | Freq: Four times a day (QID) | ORAL | 0 refills | Status: DC | PRN

## 2016-08-07 MED ORDER — CLINDAMYCIN HCL 300 MG PO CAPS: 300.0000 mg | ORAL_CAPSULE | Freq: Four times a day (QID) | ORAL | 0 refills | Status: DC

## 2016-08-07 NOTE — Discharge Instructions (Signed)
Observe for the next 24 hours for evidence of persistent vomiting, vision changes, focal weakness, numbness, lethargy. Follow-up with your primary physician to have sutures removed in one week. Follow-up with ear nose and throat surgeon for further evaluation of your facial fractures. Do not blow your nose. Take antibiotics as prescribed.

## 2016-08-07 NOTE — Progress Notes (Signed)
[facility]  [phone]) [phone] Fax:([phone]) [phone]  ID: Sandra Fritz OB: November 29, 1934  MR#: [id number]  DGL#:[id number]  Patient Care Team: Jerrol Banana., MD as PCP - General (Unknown Physician Specialty) Jerrol Banana., MD (Family Medicine) Bary Castilla Forest Gleason, MD (General Surgery)  CHIEF COMPLAINT: Clinical stage Ib ER/PR positive, HER-2 negative invasive carcinoma of the upper outer quadrant of the left breast.  INTERVAL HISTORY: Patient returns to clinic today for further evaluation and initiation of cycle 1 of 4 of Adriamycin and Cytoxan. Patient was recently at church and had a syncopal episode fracturing her orbital bone, having multiple facial ecchymoses and a small 4 mm subpleural bleed. She continues to have facial pain, but otherwise feels well. She has no neurologic complaints. She denies any recent fevers or illnesses. She has a good appetite and denies weight loss. She has no chest pain or shortness of breath. She denies any nausea, vomiting, constipation, or diarrhea. She has no urinary complaints. Patient otherwise feels well and offers no further specific complaints.  REVIEW OF SYSTEMS:   Review of Systems  Constitutional: Negative.  Negative for fever, malaise/fatigue and weight loss.  Eyes: Positive for pain. Negative for blurred vision and double vision.  Respiratory: Negative.  Negative for cough and shortness of breath.   Cardiovascular: Negative.  Negative for chest pain and leg swelling.  Gastrointestinal: Negative.  Negative for abdominal pain.  Genitourinary: Negative.   Musculoskeletal: Negative.   Skin: Negative.  Negative for rash.  Neurological: Positive for headaches. Negative for sensory change and weakness.  Psychiatric/Behavioral: The patient is nervous/anxious.     As per HPI. Otherwise, a complete review of systems is negative.  PAST MEDICAL HISTORY: Past Medical History:  Diagnosis Date  . Anemia   .  Anxiety   . Arthritis   . Blood transfusion without reported diagnosis   . Cataract   . Complication of anesthesia    HARD TO WAKE UP AFTER TONSILLECTOMY  . GERD (gastroesophageal reflux disease)   . Hyperlipidemia   . Hypertension   . Neuromuscular disorder ([place])   . Primary cancer of upper outer quadrant of left female breast ([place]) 07/28/2016    PAST SURGICAL HISTORY: Past Surgical History:  Procedure Laterality Date  . APPENDECTOMY  1955   SECONDARY TO RUTURED APPENDIX  . CATARACT EXTRACTION, BILATERAL  2011  . COLONOSCOPY  2002  . DILATION AND CURETTAGE OF UTERUS  1989   Dr. Laurey Morale  . JOINT REPLACEMENT     Right hip  . MOLE REMOVAL  1957   18 removed by Dr. Hoyle Sauer  . NECK SURGERY    . PORTACATH PLACEMENT Right 08/05/2016   Procedure: INSERTION PORT-A-CATH WITH ULTRASOUND;  Surgeon: Robert Bellow, MD;  Location: [facility] ORS;  Service: General;  Laterality: Right;  . TONSILLECTOMY  1955    FAMILY HISTORY: Family History  Problem Relation Age of Onset  . Macular degeneration Mother   . Hypertension Mother   . Diabetes Father   . Heart disease Father   . Heart attack Father 36  . Heart disease Brother 74  . Dermatomyositis Sister   . Cancer Sister        Carcinoma of the uterine call attached to colon and is in remission now  . Breast cancer Maternal Aunt 70  . Breast cancer Paternal Aunt 69  . Breast cancer Cousin        paternal aunt w/ breast ca daughter  . Breast cancer Maternal  Aunt 60    ADVANCED DIRECTIVES (Y/N):  N  HEALTH MAINTENANCE: Social History  Substance Use Topics  . Smoking status: Never Smoker  . Smokeless tobacco: Never Used  . Alcohol use No     Comment: Maybe 3 per year     Colonoscopy:  PAP:  Bone density:  Lipid panel:  Allergies  Allergen Reactions  . Keflex  [Cephalexin]     hives  . Statins     Muscle and joint pain-patient states she tried all statins.  . Erythromycin Rash    ALL MYCINS    Current Outpatient  Prescriptions  Medication Sig Dispense Refill  . aspirin 81 MG tablet Take 81 mg by mouth daily.     . clobetasol cream (TEMOVATE) 9.16 % Apply 1 application topically 2 (two) times daily. ON ARMS    . esomeprazole (NEXIUM) 20 MG capsule Take 1 capsule (20 mg total) by mouth daily. 90 capsule 3  . furosemide (LASIX) 20 MG tablet TAKE 1 TABLET (20 MG TOTAL) BY MOUTH 2 (TWO) TIMES DAILY. (Patient taking differently: Take 20 mg by mouth 2 (two) times daily. Takes 1 daily and and 2nd dose only if needed for fluid retention) 180 tablet 3  . hydrocortisone 2.5 % cream Apply 1 application topically 2 (two) times daily. ON FACE    . lidocaine-prilocaine (EMLA) cream Apply to affected area once 30 g 3  . loratadine (CLARITIN) 10 MG tablet Take 10 mg by mouth daily as needed.     . Misc Natural Products (OSTEO BI-FLEX JOINT SHIELD PO) Take 2 tablets by mouth daily.    . Multiple Vitamin (MULTIVITAMIN WITH MINERALS) TABS tablet Take 1 tablet by mouth 3 (three) times a week.    . ondansetron (ZOFRAN) 8 MG tablet Take 1 tablet (8 mg total) by mouth 2 (two) times daily as needed. Start on the third day after chemotherapy. (Patient not taking: Reported on 08/09/2016) 60 tablet 2  . prochlorperazine (COMPAZINE) 10 MG tablet Take 1 tablet (10 mg total) by mouth every 6 (six) hours as needed (Nausea or vomiting). (Patient not taking: Reported on 08/09/2016) 60 tablet 2  . rosuvastatin (CRESTOR) 5 MG tablet Take 1 tablet (5 mg total) by mouth every other day. 45 tablet 3  . sucralfate (CARAFATE) 1 g tablet Take 1 tablet (1 g total) by mouth 4 (four) times daily -  with meals and at bedtime. (Patient not taking: Reported on 08/09/2016) 120 tablet 12  . traMADol (ULTRAM) 50 MG tablet Take 1 tablet (50 mg total) by mouth every 6 (six) hours as needed. 15 tablet 0  . clindamycin (CLEOCIN) 300 MG capsule Take 1 capsule (300 mg total) by mouth 4 (four) times daily. X 7 days (Patient not taking: Reported on 08/12/2016) 28 capsule  0  . losartan (COZAAR) 100 MG tablet Take 1 tablet (100 mg total) by mouth daily. 90 tablet 3   No current facility-administered medications for this visit.     OBJECTIVE: Vitals:   08/09/16 0855  BP: (!) 149/72  Pulse: 73  Resp: 20  Temp: (!) 96.8 F (36 C)     Body mass index is 25.93 kg/m.    ECOG FS:0 - Asymptomatic  General: Well-developed, well-nourished, no acute distress. Eyes: Pink conjunctiva, anicteric sclera.  Ecchymosis mainly surrounding left eye. Breasts: Palpable left breast mass. HEENT: Multiple ecchymosis on face. Lungs: Clear to auscultation bilaterally. Heart: Regular rate and rhythm. No rubs, murmurs, or gallops. Abdomen: Soft, nontender, nondistended. No  organomegaly noted, normoactive bowel sounds. Musculoskeletal: No edema, cyanosis, or clubbing. Neuro: Alert, answering all questions appropriately. Cranial nerves grossly intact. Skin: No rashes or petechiae noted. Psych: Normal affect.   LAB RESULTS:  Lab Results  Component Value Date   NA 136 08/09/2016   K 3.3 (L) 08/09/2016   CL 100 (L) 08/09/2016   CO2 31 08/09/2016   GLUCOSE 117 (H) 08/09/2016   BUN 23 (H) 08/09/2016   CREATININE 1.28 (H) 08/09/2016   CALCIUM 9.6 08/09/2016   PROT 7.3 08/09/2016   ALBUMIN 3.8 08/09/2016   AST 27 08/09/2016   ALT 22 08/09/2016   ALKPHOS 57 08/09/2016   BILITOT 0.7 08/09/2016   GFRNONAA 39 (L) 08/09/2016   GFRAA 45 (L) 08/09/2016    Lab Results  Component Value Date   WBC 6.3 08/09/2016   NEUTROABS 4.0 08/09/2016   HGB 10.5 (L) 08/09/2016   HCT 30.9 (L) 08/09/2016   MCV 81.5 08/09/2016   PLT 359 08/09/2016     STUDIES: Ct Head Wo Contrast  Result Date: 08/06/2016 CLINICAL DATA:  Patient had a witnessed fall, + LOC, and laceration over left orbit. Patient complains of head, neck and jaw pain. She does not remember what happened EXAM: CT HEAD WITHOUT CONTRAST CT MAXILLOFACIAL WITHOUT CONTRAST CT CERVICAL SPINE WITHOUT CONTRAST TECHNIQUE:  Multidetector CT imaging of the head, cervical spine, and maxillofacial structures were performed using the standard protocol without intravenous contrast. Multiplanar CT image reconstructions of the cervical spine and maxillofacial structures were also generated. COMPARISON:  None. FINDINGS: CT HEAD FINDINGS Brain: There is a small focus of hyperattenuation within the sub cortical white matter of the superior medial left frontal lobe, measuring 4 mm in size, consistent with a small parenchymal contusion. No other evidence of intracranial hemorrhage. Ventricles are normal in configuration. There is ventricular sulcal enlargement reflecting age related volume loss. No hydrocephalus. There are no parenchymal masses or mass effect. There is no evidence of an infarct. Mild patchy white matter hypoattenuation is noted consistent with chronic microvascular ischemic change. There are no extra-axial masses or abnormal fluid collections. Vascular: No hyperdense vessel or unexpected calcification. Skull: Normal. Negative for fracture or focal lesion. Other: None. CT MAXILLOFACIAL FINDINGS Osseous: There is a fracture of the mid to posterior aspect of the left orbital floor with no significant depression or displacement. Small amount of extraconal intraorbital air lies adjacent to the fracture. Cell no other fractures. Orbits: Mild preseptal left periorbital soft tissue swelling/ hemorrhage. Small laceration to the left supraorbital forehead. No radiopaque foreign body. Unremarkable globes other than changes from cataract surgery. The postseptal orbits are unremarkable other than the small amount of extraconal air noted on the left associated with the orbital floor fracture. Sinuses: Left maxillary sinuses partly filled with dependent hemorrhage. Remaining visualized sinuses are clear. Clear mastoid air cells and middle ear cavities. Soft tissues: No other soft tissue abnormality. No soft tissue masses or adenopathy. CT  CERVICAL SPINE FINDINGS Alignment: Mild reversal the normal cervical lordosis. No spondylolisthesis. Skull base and vertebrae: No fracture. No osteoblastic or osteolytic lesions. Status post anterior cervical spine fusion from C3 through C6 with an anterior fusion plate, fixation screws and metal intervertebral spacers/cages. The orthopedic hardware appears well seated with no evidence of loosening. Soft tissues and spinal canal: No prevertebral fluid or swelling. No visible canal hematoma. Disc levels: There is moderate loss of disc height with endplate spurring at the C6-C7 level. Endplate spurring along the mid to lower cervical spine these  2 at least mild degrees of central stenosis, which appears chronic. No convincing disc herniation. Neural foramina are well preserved. Upper chest: No acute findings. Thyroid is enlarged with multiple small nodules. Other: None. IMPRESSION: HEAD CT 1. Small, 4 mm, focus of parenchymal hemorrhage in the superior medial left frontal lobe subcortical white matter. 2. No other acute intracranial abnormality. 3. No skull fracture. MAXILLOFACIAL CT 1. Nondisplaced left orbital floor fracture associated with the left maxillary sinus hemorrhage. No herniation of the orbital contents or entrapment of the extra-ocular musculature. No intraorbital hematoma or inflammation. 2. No other fractures. 3. Left supraorbital forehead laceration and mild left preseptal periorbital soft tissue swelling. CERVICAL CT 1. No fracture or acute finding. 2. No evidence of loosening of the cervical spine fusion hardware. Electronically Signed   By: Lajean Manes M.D.   On: 08/06/2016 17:20   Ct Cervical Spine Wo Contrast  Result Date: 08/06/2016 CLINICAL DATA:  Patient had a witnessed fall, + LOC, and laceration over left orbit. Patient complains of head, neck and jaw pain. She does not remember what happened EXAM: CT HEAD WITHOUT CONTRAST CT MAXILLOFACIAL WITHOUT CONTRAST CT CERVICAL SPINE WITHOUT  CONTRAST TECHNIQUE: Multidetector CT imaging of the head, cervical spine, and maxillofacial structures were performed using the standard protocol without intravenous contrast. Multiplanar CT image reconstructions of the cervical spine and maxillofacial structures were also generated. COMPARISON:  None. FINDINGS: CT HEAD FINDINGS Brain: There is a small focus of hyperattenuation within the sub cortical white matter of the superior medial left frontal lobe, measuring 4 mm in size, consistent with a small parenchymal contusion. No other evidence of intracranial hemorrhage. Ventricles are normal in configuration. There is ventricular sulcal enlargement reflecting age related volume loss. No hydrocephalus. There are no parenchymal masses or mass effect. There is no evidence of an infarct. Mild patchy white matter hypoattenuation is noted consistent with chronic microvascular ischemic change. There are no extra-axial masses or abnormal fluid collections. Vascular: No hyperdense vessel or unexpected calcification. Skull: Normal. Negative for fracture or focal lesion. Other: None. CT MAXILLOFACIAL FINDINGS Osseous: There is a fracture of the mid to posterior aspect of the left orbital floor with no significant depression or displacement. Small amount of extraconal intraorbital air lies adjacent to the fracture. Cell no other fractures. Orbits: Mild preseptal left periorbital soft tissue swelling/ hemorrhage. Small laceration to the left supraorbital forehead. No radiopaque foreign body. Unremarkable globes other than changes from cataract surgery. The postseptal orbits are unremarkable other than the small amount of extraconal air noted on the left associated with the orbital floor fracture. Sinuses: Left maxillary sinuses partly filled with dependent hemorrhage. Remaining visualized sinuses are clear. Clear mastoid air cells and middle ear cavities. Soft tissues: No other soft tissue abnormality. No soft tissue masses or  adenopathy. CT CERVICAL SPINE FINDINGS Alignment: Mild reversal the normal cervical lordosis. No spondylolisthesis. Skull base and vertebrae: No fracture. No osteoblastic or osteolytic lesions. Status post anterior cervical spine fusion from C3 through C6 with an anterior fusion plate, fixation screws and metal intervertebral spacers/cages. The orthopedic hardware appears well seated with no evidence of loosening. Soft tissues and spinal canal: No prevertebral fluid or swelling. No visible canal hematoma. Disc levels: There is moderate loss of disc height with endplate spurring at the C6-C7 level. Endplate spurring along the mid to lower cervical spine these 2 at least mild degrees of central stenosis, which appears chronic. No convincing disc herniation. Neural foramina are well preserved. Upper chest: No acute  findings. Thyroid is enlarged with multiple small nodules. Other: None. IMPRESSION: HEAD CT 1. Small, 4 mm, focus of parenchymal hemorrhage in the superior medial left frontal lobe subcortical white matter. 2. No other acute intracranial abnormality. 3. No skull fracture. MAXILLOFACIAL CT 1. Nondisplaced left orbital floor fracture associated with the left maxillary sinus hemorrhage. No herniation of the orbital contents or entrapment of the extra-ocular musculature. No intraorbital hematoma or inflammation. 2. No other fractures. 3. Left supraorbital forehead laceration and mild left preseptal periorbital soft tissue swelling. CERVICAL CT 1. No fracture or acute finding. 2. No evidence of loosening of the cervical spine fusion hardware. Electronically Signed   By: Lajean Manes M.D.   On: 08/06/2016 17:20   Nm Cardiac Muga Rest  Result Date: 08/03/2016 CLINICAL DATA:  Left breast cancer. Pre chemotherapy cardiac assessment. EXAM: NUCLEAR MEDICINE CARDIAC BLOOD POOL IMAGING (MUGA) TECHNIQUE: Cardiac multi-gated acquisition was performed at rest following intravenous injection of Tc-65mlabeled red blood  cells. RADIOPHARMACEUTICALS:  21.7 mCi Tc-975mDP in-vitro labeled red blood cells IV COMPARISON:  None. FINDINGS: Normal left ventricular wall motion. No akinetic or dyskinetic segments are identified. The ejection fraction was calculated at 68.9%. IMPRESSION: Normal left ventricular wall motion with ejection fraction of 68.9%. Electronically Signed   By: P.Marijo Sanes.D.   On: 08/03/2016 12:17   Dg Chest Port 1 View  Result Date: 08/05/2016 CLINICAL DATA:  Port-A-Cath placement. EXAM: PORTABLE CHEST 1 VIEW COMPARISON:  10/16/2013 chest radiograph FINDINGS: Upper limits normal heart size again noted. A right subclavian central venous catheter is present with tip overlying the superior cavoatrial junction. There is no evidence of focal airspace disease, pulmonary edema, suspicious pulmonary nodule/mass, pleural effusion, or pneumothorax. No acute bony abnormalities are identified. IMPRESSION: Right subclavian central venous catheter with tip overlying the superior cavoatrial junction. No evidence of pneumothorax. Electronically Signed   By: JeMargarette Canada.D.   On: 08/05/2016 11:33   Dg C-arm 1-60 Min-no Report  Result Date: 08/05/2016 Fluoroscopy was utilized by the requesting physician.  No radiographic interpretation.   UsKoreareast Complete Uni Left Inc Axilla  Result Date: 07/25/2016 Ultrasound examination of the breast showed ill-defined hypoechoic mass within shadowing at the 3:00 position 1 cm from nipple measuring 1.2 x 1.7 x 1.93 cm. BIRAD-5. The patient was amenable to ultrasound-guided core biopsy. The procedure was reviewed. The breast was prepped with alcohol and 10 mL of 0.5% Xylocaine with 0.25% Marcaine with 1:200,000 epinephrine was utilized well tolerated. ChloraPrep was applied to the skin. The area was approached from a LM direction and the skin was incised sharply with an 11 blade. Initial biopsy was completed making use of a Finesse 12-gauge device. This malfunctioned and the remaining  biopsies were completed making use of a 14-gauge Bard spring loaded biopsy device. 5 long cores were obtained. Postbiopsy clip was placed. Scant bleeding was noted. Benzoin followed by Steri-Strips, Telfa and Tegaderm were applied.   Ct Maxillofacial Wo Contrast  Result Date: 08/06/2016 CLINICAL DATA:  Patient had a witnessed fall, + LOC, and laceration over left orbit. Patient complains of head, neck and jaw pain. She does not remember what happened EXAM: CT HEAD WITHOUT CONTRAST CT MAXILLOFACIAL WITHOUT CONTRAST CT CERVICAL SPINE WITHOUT CONTRAST TECHNIQUE: Multidetector CT imaging of the head, cervical spine, and maxillofacial structures were performed using the standard protocol without intravenous contrast. Multiplanar CT image reconstructions of the cervical spine and maxillofacial structures were also generated. COMPARISON:  None. FINDINGS: CT HEAD FINDINGS Brain:  There is a small focus of hyperattenuation within the sub cortical white matter of the superior medial left frontal lobe, measuring 4 mm in size, consistent with a small parenchymal contusion. No other evidence of intracranial hemorrhage. Ventricles are normal in configuration. There is ventricular sulcal enlargement reflecting age related volume loss. No hydrocephalus. There are no parenchymal masses or mass effect. There is no evidence of an infarct. Mild patchy white matter hypoattenuation is noted consistent with chronic microvascular ischemic change. There are no extra-axial masses or abnormal fluid collections. Vascular: No hyperdense vessel or unexpected calcification. Skull: Normal. Negative for fracture or focal lesion. Other: None. CT MAXILLOFACIAL FINDINGS Osseous: There is a fracture of the mid to posterior aspect of the left orbital floor with no significant depression or displacement. Small amount of extraconal intraorbital air lies adjacent to the fracture. Cell no other fractures. Orbits: Mild preseptal left periorbital soft  tissue swelling/ hemorrhage. Small laceration to the left supraorbital forehead. No radiopaque foreign body. Unremarkable globes other than changes from cataract surgery. The postseptal orbits are unremarkable other than the small amount of extraconal air noted on the left associated with the orbital floor fracture. Sinuses: Left maxillary sinuses partly filled with dependent hemorrhage. Remaining visualized sinuses are clear. Clear mastoid air cells and middle ear cavities. Soft tissues: No other soft tissue abnormality. No soft tissue masses or adenopathy. CT CERVICAL SPINE FINDINGS Alignment: Mild reversal the normal cervical lordosis. No spondylolisthesis. Skull base and vertebrae: No fracture. No osteoblastic or osteolytic lesions. Status post anterior cervical spine fusion from C3 through C6 with an anterior fusion plate, fixation screws and metal intervertebral spacers/cages. The orthopedic hardware appears well seated with no evidence of loosening. Soft tissues and spinal canal: No prevertebral fluid or swelling. No visible canal hematoma. Disc levels: There is moderate loss of disc height with endplate spurring at the C6-C7 level. Endplate spurring along the mid to lower cervical spine these 2 at least mild degrees of central stenosis, which appears chronic. No convincing disc herniation. Neural foramina are well preserved. Upper chest: No acute findings. Thyroid is enlarged with multiple small nodules. Other: None. IMPRESSION: HEAD CT 1. Small, 4 mm, focus of parenchymal hemorrhage in the superior medial left frontal lobe subcortical white matter. 2. No other acute intracranial abnormality. 3. No skull fracture. MAXILLOFACIAL CT 1. Nondisplaced left orbital floor fracture associated with the left maxillary sinus hemorrhage. No herniation of the orbital contents or entrapment of the extra-ocular musculature. No intraorbital hematoma or inflammation. 2. No other fractures. 3. Left supraorbital forehead  laceration and mild left preseptal periorbital soft tissue swelling. CERVICAL CT 1. No fracture or acute finding. 2. No evidence of loosening of the cervical spine fusion hardware. Electronically Signed   By: Lajean Manes M.D.   On: 08/06/2016 17:20    ASSESSMENT: Clinical stage Ib ER/PR positive, HER-2 negative invasive carcinoma of the upper outer quadrant of the left breast.  PLAN:    1. Clinical stage Ib ER/PR positive, HER-2 negative invasive carcinoma of the upper outer quadrant of the left breast: Given the size of patient's breast mass at 2.7 cm, have recommended neoadjuvant chemotherapy using Adriamycin, Cytoxan, and Taxol. She will also require Neulasta support. Pretreatment MUGA scan revealed an EF of 69%. Once she completes neoadjuvant chemotherapy, will refer back to surgery for possible lumpectomy. Patient will also require adjuvant XRT. Finally, given the ER/PR status of her tumor she will benefit from an aromatase inhibitor for 5 years. Delay cycle 1 of 4  of Adriamycin and Cytoxan today given her recent facial injuries. Return to clinic in 1 week for reconsideration of cycle 1. 2. Syncopal episode: Unclear etiology, patient was evaluated in the emergency room on day of her injuries. 3. Subdural hematoma: Secondary to fall. Delay treatment 1 week as above.  Approximately 30 minutes was spent in discussion of which greater than 50% was consultation.  Patient expressed understanding and was in agreement with this plan. She also understands that She can call clinic at any time with any questions, concerns, or complaints.   Cancer Staging Primary cancer of upper outer quadrant of left female breast [facility]) Staging form: Breast, AJCC 8th Edition - Clinical stage from 07/28/2016: Stage IB (cT2, cN0, cM0, G2, ER: Positive, PR: Positive, HER2: Negative) - Signed by Lloyd Huger, MD on 07/28/2016   Lloyd Huger, MD   08/15/2016 6:00 PM

## 2016-08-07 NOTE — ED Provider Notes (Signed)
LACERATION REPAIR Performed by: Brent General Authorized by: Brent General Consent: Verbal consent obtained. Risks and benefits: risks, benefits and alternatives were discussed Consent given by: patient Patient identity confirmed: provided demographic data Prepped and Draped in normal sterile fashion Wound explored  Laceration Location: L forehead   Laceration Length: 3 cm  No Foreign Bodies seen or palpated  Anesthesia: local infiltration  Local anesthetic: lidocaine 2% w epinephrine  Anesthetic total: 5 ml  Irrigation method: syringe Amount of cleaning: standard  Skin closure: 6-0 Prolene  Number of sutures: 8  Technique: Simple Interrupted  Patient tolerance: Patient tolerated the procedure well with no immediate complications.   LACERATION REPAIR Performed by: Brent General Authorized by: Brent General Consent: Verbal consent obtained. Risks and benefits: risks, benefits and alternatives were discussed Consent given by: patient Patient identity confirmed: provided demographic data Prepped and Draped in normal sterile fashion Wound explored  Laceration Location: L lower lip   Laceration Length: 1 .5 cm  No Foreign Bodies seen or palpated  Anesthesia: local infiltration  Local anesthetic: lidocaine 2% w epinephrine  Anesthetic total: 3 ml  Irrigation method: syringe Amount of cleaning: standard  Skin closure: 6-0 Prolene  Number of sutures: 4  Technique: simple interrupted  Patient tolerance: Patient tolerated the procedure well with no immediate complications.   Dalia Heading, PA-C 08/07/16 Sherryle Lis, MD 08/10/16 [phone]

## 2016-08-08 DIAGNOSIS — H43813 Vitreous degeneration, bilateral: Secondary | ICD-10-CM | POA: Diagnosis not present

## 2016-08-09 ENCOUNTER — Inpatient Hospital Stay: Payer: PPO

## 2016-08-09 ENCOUNTER — Ambulatory Visit: Payer: PPO | Admitting: Family Medicine

## 2016-08-09 ENCOUNTER — Inpatient Hospital Stay (HOSPITAL_BASED_OUTPATIENT_CLINIC_OR_DEPARTMENT_OTHER): Payer: PPO | Admitting: Oncology

## 2016-08-09 ENCOUNTER — Encounter: Payer: Self-pay | Admitting: *Deleted

## 2016-08-09 ENCOUNTER — Ambulatory Visit (INDEPENDENT_AMBULATORY_CARE_PROVIDER_SITE_OTHER): Payer: PPO | Admitting: Family Medicine

## 2016-08-09 VITALS — BP 149/72 | HR 73 | Temp 96.8°F | Resp 20 | Wt 175.6 lb

## 2016-08-09 VITALS — BP 122/50 | HR 84 | Temp 97.5°F | Resp 16 | Wt 178.0 lb

## 2016-08-09 DIAGNOSIS — Z7982 Long term (current) use of aspirin: Secondary | ICD-10-CM

## 2016-08-09 DIAGNOSIS — S0232XD Fracture of orbital floor, left side, subsequent encounter for fracture with routine healing: Secondary | ICD-10-CM | POA: Diagnosis not present

## 2016-08-09 DIAGNOSIS — J45909 Unspecified asthma, uncomplicated: Secondary | ICD-10-CM

## 2016-08-09 DIAGNOSIS — Z79899 Other long term (current) drug therapy: Secondary | ICD-10-CM | POA: Diagnosis not present

## 2016-08-09 DIAGNOSIS — I10 Essential (primary) hypertension: Secondary | ICD-10-CM | POA: Diagnosis not present

## 2016-08-09 DIAGNOSIS — C50412 Malignant neoplasm of upper-outer quadrant of left female breast: Secondary | ICD-10-CM

## 2016-08-09 DIAGNOSIS — Z803 Family history of malignant neoplasm of breast: Secondary | ICD-10-CM

## 2016-08-09 DIAGNOSIS — I629 Nontraumatic intracranial hemorrhage, unspecified: Secondary | ICD-10-CM

## 2016-08-09 DIAGNOSIS — F419 Anxiety disorder, unspecified: Secondary | ICD-10-CM | POA: Diagnosis not present

## 2016-08-09 DIAGNOSIS — D649 Anemia, unspecified: Secondary | ICD-10-CM | POA: Diagnosis not present

## 2016-08-09 DIAGNOSIS — Z7689 Persons encountering health services in other specified circumstances: Secondary | ICD-10-CM

## 2016-08-09 DIAGNOSIS — S0181XD Laceration without foreign body of other part of head, subsequent encounter: Secondary | ICD-10-CM | POA: Diagnosis not present

## 2016-08-09 DIAGNOSIS — M129 Arthropathy, unspecified: Secondary | ICD-10-CM | POA: Diagnosis not present

## 2016-08-09 DIAGNOSIS — E785 Hyperlipidemia, unspecified: Secondary | ICD-10-CM | POA: Diagnosis not present

## 2016-08-09 DIAGNOSIS — Z170 Estrogen receptor positive status [ER+]: Secondary | ICD-10-CM

## 2016-08-09 DIAGNOSIS — K219 Gastro-esophageal reflux disease without esophagitis: Secondary | ICD-10-CM

## 2016-08-09 DIAGNOSIS — Z5111 Encounter for antineoplastic chemotherapy: Secondary | ICD-10-CM | POA: Diagnosis not present

## 2016-08-09 LAB — COMPREHENSIVE METABOLIC PANEL
ALT: 22 U/L (ref 14–54)
ANION GAP: 5 (ref 5–15)
AST: 27 U/L (ref 15–41)
Albumin: 3.8 g/dL (ref 3.5–5.0)
Alkaline Phosphatase: 57 U/L (ref 38–126)
BUN: 23 mg/dL — ABNORMAL HIGH (ref 6–20)
CHLORIDE: 100 mmol/L — AB (ref 101–111)
CO2: 31 mmol/L (ref 22–32)
CREATININE: 1.28 mg/dL — AB (ref 0.44–1.00)
Calcium: 9.6 mg/dL (ref 8.9–10.3)
GFR calc non Af Amer: 39 mL/min — ABNORMAL LOW (ref >60)
GFR, EST AFRICAN AMERICAN: 45 mL/min — AB (ref >60)
Glucose, Bld: 117 mg/dL — ABNORMAL HIGH (ref 65–99)
POTASSIUM: 3.3 mmol/L — AB (ref 3.5–5.1)
SODIUM: 136 mmol/L (ref 135–145)
Total Bilirubin: 0.7 mg/dL (ref 0.3–1.2)
Total Protein: 7.3 g/dL (ref 6.5–8.1)

## 2016-08-09 LAB — CBC WITH DIFFERENTIAL/PLATELET
Basophils Absolute: 0 10*3/uL (ref 0–0.1)
Basophils Relative: 1 %
EOS ABS: 0.2 10*3/uL (ref 0–0.7)
Eosinophils Relative: 4 %
HEMATOCRIT: 30.9 % — AB (ref 35.0–47.0)
HEMOGLOBIN: 10.5 g/dL — AB (ref 12.0–16.0)
LYMPHS ABS: 1.5 10*3/uL (ref 1.0–3.6)
Lymphocytes Relative: 24 %
MCH: 27.6 pg (ref 26.0–34.0)
MCHC: 33.9 g/dL (ref 32.0–36.0)
MCV: 81.5 fL (ref 80.0–100.0)
MONO ABS: 0.5 10*3/uL (ref 0.2–0.9)
MONOS PCT: 8 %
NEUTROS PCT: 63 %
Neutro Abs: 4 10*3/uL (ref 1.4–6.5)
PLATELETS: 359 10*3/uL (ref 150–440)
RBC: 3.79 MIL/uL — ABNORMAL LOW (ref 3.80–5.20)
RDW: 15.6 % — AB (ref 11.5–14.5)
WBC: 6.3 10*3/uL (ref 3.6–11.0)

## 2016-08-09 MED ORDER — LOSARTAN POTASSIUM 100 MG PO TABS: 100.0000 mg | ORAL_TABLET | Freq: Every day | ORAL | 3 refills | Status: DC

## 2016-08-09 MED ORDER — HEPARIN SOD (PORK) LOCK FLUSH 100 UNIT/ML IV SOLN
500.0000 [IU] | Freq: Once | INTRAVENOUS | Status: AC
  Administered 2016-08-09: 500 [IU] via INTRAVENOUS
  Filled 2016-08-09: qty 5

## 2016-08-09 NOTE — Progress Notes (Signed)
Patient reports fall over the weekend, seen in the ER. Patient reports stitches to face, hemorrhage per patient.

## 2016-08-09 NOTE — Progress Notes (Signed)
  Oncology Nurse Navigator Documentation  Navigator Location: [facility] (08/09/16 0900)   )Navigator Encounter Type: Clinic/MDC (08/09/16 0900)                     Patient Visit Type: MedOnc (08/09/16 0900) Treatment Phase: First Chemo Tx (08/09/16 0900)                            Time Spent with Patient: 15 (08/09/16 0900)    Met patient and her friend today in the clinic.  Patient is supposed to start her first treatment today.  She fell over the weekend and has sutures on her forehead, and lips.  Patient also states she has a brain hematoma.  Informed patient not to be surprised if she does not start her treatment today.  She has not seen Dr. Grayland Ormond yet today.  She is to call if she has any questions or needs.

## 2016-08-09 NOTE — Progress Notes (Signed)
Patient: Sandra Fritz Female    DOB: [birth year]   82 y.o.   MRN: [id number] Visit Date: 08/09/2016  Today's Provider: Wilhemena Durie, MD   Chief Complaint  Patient presents with  . ER Follow Up  . Hypertension   Subjective:    HPI     Follow up ER visit  Patient was seen in ER for a fall after a syncopal episode on 08/06/2016. Pt was placed on anesthesia the day prior to the fall to have a port placed. Pt went to her cousin's visitation at the funeral home that evening, and stood in line for 2 hours. The next day at the funeral, pt passed out onto concrete. Pt did not feel presyncopal prior to the fall. Pt denies any more syncope or presyncope. She was treated for intracranial hemorrhage, left orbital fx, facial laceration. Treatment for this included Clindamycin 300 mg 4 x daily x 7 days. She reports good compliance with treatment. Pt started the abx this morning. She reports this condition is Improved. Pt is still c/o dizziness/lightheadedness. Pt reports she is now less confident when walking.  ------------------------------------------------------------------------------------   Hypertension, follow-up:  BP Readings from Last 3 Encounters:  08/09/16 (!) 122/50  08/09/16 (!) 149/72  08/07/16 (!) 160/69    She was last seen for hypertension 3 months ago.  BP at that visit was 158/74. Management since that visit includes adding changing losartan 100 mg to losartan-HCTZ 100-12.5 mg. She reports good compliance with treatment. She is exercising. Three times a week at the gym. She is adherent to low salt diet.   She is experiencing fatigue, lower extremity edema, syncope and lightheadedness.  Patient denies chest pain, chest pressure/discomfort, claudication, dyspnea, exertional chest pressure/discomfort, irregular heart beat, near-syncope, orthopnea and palpitations.   Cardiovascular risk factors include advanced age (older than 67 for men, 62 for women),  dyslipidemia and hypertension.   Weight trend: stable Wt Readings from Last 3 Encounters:  08/09/16 178 lb (80.7 kg)  08/09/16 175 lb 9.6 oz (79.7 kg)  08/06/16 176 lb (79.8 kg)    Current diet: in general, a "healthy" diet    ------------------------------------------------------------------------   Allergies  Allergen Reactions  . Keflex  [Cephalexin]     hives  . Statins     Muscle and joint pain-patient states she tried all statins.  . Erythromycin Rash    ALL MYCINS     Current Outpatient Prescriptions:  .  clindamycin (CLEOCIN) 300 MG capsule, Take 1 capsule (300 mg total) by mouth 4 (four) times daily. X 7 days, Disp: 28 capsule, Rfl: 0 .  clobetasol cream (TEMOVATE) 2.68 %, Apply 1 application topically 2 (two) times daily. ON ARMS, Disp: , Rfl:  .  esomeprazole (NEXIUM) 20 MG capsule, Take 1 capsule (20 mg total) by mouth daily., Disp: 90 capsule, Rfl: 3 .  furosemide (LASIX) 20 MG tablet, TAKE 1 TABLET (20 MG TOTAL) BY MOUTH 2 (TWO) TIMES DAILY. (Patient taking differently: Take 20 mg by mouth 2 (two) times daily. Takes 1 daily and and 2nd dose only if needed for fluid retention), Disp: 180 tablet, Rfl: 3 .  hydrocortisone 2.5 % cream, Apply 1 application topically 2 (two) times daily. ON FACE, Disp: , Rfl:  .  lidocaine-prilocaine (EMLA) cream, Apply to affected area once, Disp: 30 g, Rfl: 3 .  losartan-hydrochlorothiazide (HYZAAR) 100-12.5 MG tablet, Take 1 tablet by mouth daily. (Patient taking differently: Take 1 tablet by mouth daily at 6  PM. ), Disp: 30 tablet, Rfl: 12 .  Misc Natural Products (OSTEO BI-FLEX JOINT SHIELD PO), Take 2 tablets by mouth daily., Disp: , Rfl:  .  Multiple Vitamin (MULTIVITAMIN WITH MINERALS) TABS tablet, Take 1 tablet by mouth 3 (three) times a week., Disp: , Rfl:  .  rosuvastatin (CRESTOR) 5 MG tablet, Take 1 tablet (5 mg total) by mouth every other day., Disp: 45 tablet, Rfl: 3 .  traMADol (ULTRAM) 50 MG tablet, Take 1 tablet (50 mg  total) by mouth every 6 (six) hours as needed., Disp: 15 tablet, Rfl: 0 .  aspirin 81 MG tablet, Take 81 mg by mouth daily. , Disp: , Rfl:  .  loratadine (CLARITIN) 10 MG tablet, Take 10 mg by mouth daily as needed. , Disp: , Rfl:  .  ondansetron (ZOFRAN) 8 MG tablet, Take 1 tablet (8 mg total) by mouth 2 (two) times daily as needed. Start on the third day after chemotherapy. (Patient not taking: Reported on 08/09/2016), Disp: 60 tablet, Rfl: 2 .  prochlorperazine (COMPAZINE) 10 MG tablet, Take 1 tablet (10 mg total) by mouth every 6 (six) hours as needed (Nausea or vomiting). (Patient not taking: Reported on 08/09/2016), Disp: 60 tablet, Rfl: 2 .  sucralfate (CARAFATE) 1 g tablet, Take 1 tablet (1 g total) by mouth 4 (four) times daily -  with meals and at bedtime. (Patient not taking: Reported on 08/09/2016), Disp: 120 tablet, Rfl: 12  Review of Systems  Constitutional: Positive for fatigue. Negative for activity change, appetite change, chills, diaphoresis, fever and unexpected weight change.  HENT: Positive for facial swelling.   Eyes: Negative.   Respiratory: Negative for shortness of breath.   Cardiovascular: Positive for leg swelling. Negative for chest pain and palpitations.  Endocrine: Negative.   Allergic/Immunologic: Negative.   Neurological: Positive for dizziness and light-headedness. Negative for syncope.  Hematological: Negative.   Psychiatric/Behavioral: Negative.     Social History  Substance Use Topics  . Smoking status: Never Smoker  . Smokeless tobacco: Never Used  . Alcohol use No     Comment: Maybe 3 per year   Objective:   BP (!) 122/50 (BP Location: Right Arm, Patient Position: Sitting, Cuff Size: Normal)   Pulse 84   Temp 97.5 F (36.4 C) (Oral)   Resp 16   Wt 178 lb (80.7 kg)   BMI 26.29 kg/m  Vitals:   08/09/16 1347  BP: (!) 122/50  Pulse: 84  Resp: 16  Temp: 97.5 F (36.4 C)  TempSrc: Oral  Weight: 178 lb (80.7 kg)     Physical Exam    Constitutional: She is oriented to person, place, and time. She appears well-developed and well-nourished.  HENT:  Head: Normocephalic. Head is with contusion and with laceration.  Right Ear: External ear normal.  Left Ear: External ear normal.  Nose: Nose normal.  Bruising ,swelling around and above left eye.  Eyes: Conjunctivae are normal. No scleral icterus.  Neck: Neck supple. No thyromegaly present.  Cardiovascular: Normal rate, regular rhythm and normal heart sounds.   Pulmonary/Chest: Effort normal and breath sounds normal. No respiratory distress.  Abdominal: Soft.  Neurological: She is alert and oriented to person, place, and time. No cranial nerve deficit. She exhibits normal muscle tone. Coordination normal.  nonfocal.  Skin: Skin is warm and dry.  Laceration above left eye and on lip healing well.  Psychiatric: She has a normal mood and affect. Her behavior is normal. Judgment and thought content normal.  Assessment & Plan:     1. Essential hypertension BP slightly low; question if this could be causing the syncope, lightheadedness/dizziness. Change from Hyzaar to losartan as below. FU 1 month. - losartan (COZAAR) 100 MG tablet; Take 1 tablet (100 mg total) by mouth daily.  Dispense: 90 tablet; Refill: 3  2. Intracranial hemorrhage (HCC)   3. Closed fracture of left orbital floor with routine healing, subsequent encounter   4. Facial laceration, subsequent encounter Return in Friday for suture removal.  5. Primary cancer of upper outer quadrant of left female breast ([place]) FU with oncology as scheduled. 6.Syncope Almost certainly vasovagal.No further w/u presently.If pt has further problems will get cardiology and neurology w/u. Advised pt to stay hydrated with cancer treatments.      Richard Cranford Mon, MD  [facility]

## 2016-08-12 ENCOUNTER — Encounter: Payer: Self-pay | Admitting: Family Medicine

## 2016-08-12 ENCOUNTER — Ambulatory Visit (INDEPENDENT_AMBULATORY_CARE_PROVIDER_SITE_OTHER): Payer: PPO | Admitting: Family Medicine

## 2016-08-12 VITALS — BP 140/58 | HR 84 | Temp 98.0°F | Resp 16 | Wt 177.4 lb

## 2016-08-12 DIAGNOSIS — Z4802 Encounter for removal of sutures: Secondary | ICD-10-CM

## 2016-08-12 NOTE — Progress Notes (Signed)
Subjective:     Patient ID: Sandra Fritz, female   DOB: 1934-08-25, 82 y.o.   MRN: [id number]  HPI  Chief Complaint  Patient presents with  . Suture / Staple Removal    Patient was seen 08/09/16 for follow-up syncope episode. Patient was seen 05/19 at the ED for syncope episode, patient was treated fo intracanial hemorrhage, left orbital fracture and facial laceration and is here for suture removal.  States that a couple of the sutures that were in her lip are no longer present. Originally had 8 in forehead and 4 in lip.   Review of Systems     Objective:   Physical Exam  Constitutional: She appears well-developed and well-nourished. No distress.  Skin:  Wounds without drainage. Forehead sutures x 8 removed atraumatically. Lateral aspect of wound is raised but not dehisced. Lip sutures removed x two with small amount of residual eschar noted.       Assessment:    1. Visit for suture removal: removal x 10    Plan:    F/u as needed if residual suture noted. Bandaid applied to forehead. Cleanse gently with soap and water.

## 2016-08-12 NOTE — Patient Instructions (Signed)
Cleanse wounds gently with soap and water. May cover forehead wound with bandaid.

## 2016-08-15 NOTE — Progress Notes (Signed)
Sandra Fritz  [phone] Fax:(336) [phone]  ID: Sandra Fritz OB: [birth year]  MR#: [id number]  VZD#:[id number]  Patient Care Team: Jerrol Banana., MD as PCP - General (Unknown Physician Specialty) Jerrol Banana., MD (Family Medicine) Bary Castilla Forest Gleason, MD (General Surgery)  CHIEF COMPLAINT: Clinical stage Ib ER/PR positive, HER-2 negative invasive carcinoma of the upper outer quadrant of the left breast.  INTERVAL HISTORY: Patient returns to clinic today for further evaluation and reconsideration of cycle 1 of 4 of Adriamycin and Cytoxan. She has no further falls or episodes of syncope and is nearly recovered from her previous facial trauma. She has no neurologic complaints. She denies any recent fevers or illnesses. She has a good appetite and denies weight loss. She has no chest pain or shortness of breath. She denies any nausea, vomiting, constipation, or diarrhea. She has no urinary complaints. Patient otherwise feels well and offers no further specific complaints.  REVIEW OF SYSTEMS:   Review of Systems  Constitutional: Negative.  Negative for fever, malaise/fatigue and weight loss.  Eyes: Positive for pain. Negative for blurred vision and double vision.  Respiratory: Negative.  Negative for cough and shortness of breath.   Cardiovascular: Negative.  Negative for chest pain and leg swelling.  Gastrointestinal: Negative.  Negative for abdominal pain.  Genitourinary: Negative.   Musculoskeletal: Negative.   Skin: Negative.  Negative for rash.  Neurological: Positive for headaches. Negative for sensory change and weakness.  Psychiatric/Behavioral: The patient is nervous/anxious.     As per HPI. Otherwise, a complete review of systems is negative.  PAST MEDICAL HISTORY: Past Medical History:  Diagnosis Date  . Anemia   . Anxiety   . Arthritis   . Blood transfusion without reported diagnosis   . Cataract   . Complication of  anesthesia    HARD TO WAKE UP AFTER TONSILLECTOMY  . GERD (gastroesophageal reflux disease)   . Hyperlipidemia   . Hypertension   . Neuromuscular disorder ([place])   . Primary cancer of upper outer quadrant of left female breast ([place]) 07/28/2016    PAST SURGICAL HISTORY: Past Surgical History:  Procedure Laterality Date  . APPENDECTOMY  1955   SECONDARY TO RUTURED APPENDIX  . CATARACT EXTRACTION, BILATERAL  2011  . COLONOSCOPY  2002  . DILATION AND CURETTAGE OF UTERUS  1989   Dr. Laurey Morale  . JOINT REPLACEMENT     Right hip  . MOLE REMOVAL  1957   18 removed by Dr. Hoyle Sauer  . NECK SURGERY    . PORTACATH PLACEMENT Right 08/05/2016   Procedure: INSERTION PORT-A-CATH WITH ULTRASOUND;  Surgeon: Robert Bellow, MD;  Location: [facility] ORS;  Service: General;  Laterality: Right;  . TONSILLECTOMY  1955    FAMILY HISTORY: Family History  Problem Relation Age of Onset  . Macular degeneration Mother   . Hypertension Mother   . Diabetes Father   . Heart disease Father   . Heart attack Father 68  . Heart disease Brother 61  . Dermatomyositis Sister   . Cancer Sister        Carcinoma of the uterine call attached to colon and is in remission now  . Breast cancer Maternal Aunt 70  . Breast cancer Paternal Aunt 3  . Breast cancer Cousin        paternal aunt w/ breast ca daughter  . Breast cancer Maternal Aunt 57    ADVANCED DIRECTIVES (Y/N):  N  HEALTH MAINTENANCE: Social History  Substance  Use Topics  . Smoking status: Never Smoker  . Smokeless tobacco: Never Used  . Alcohol use No     Comment: Maybe 3 per year     Colonoscopy:  PAP:  Bone density:  Lipid panel:  Allergies  Allergen Reactions  . Keflex  [Cephalexin]     hives  . Statins     Muscle and joint pain-patient states she tried all statins.  . Erythromycin Rash    ALL MYCINS    Current Outpatient Prescriptions  Medication Sig Dispense Refill  . clindamycin (CLEOCIN) 300 MG capsule Take 1 capsule (300  mg total) by mouth 4 (four) times daily. X 7 days 28 capsule 0  . esomeprazole (NEXIUM) 20 MG capsule Take 1 capsule (20 mg total) by mouth daily. 90 capsule 3  . furosemide (LASIX) 20 MG tablet TAKE 1 TABLET (20 MG TOTAL) BY MOUTH 2 (TWO) TIMES DAILY. (Patient taking differently: Take 20 mg by mouth 2 (two) times daily. Takes 1 daily and and 2nd dose only if needed for fluid retention) 180 tablet 3  . lidocaine-prilocaine (EMLA) cream Apply to affected area once 30 g 3  . loratadine (CLARITIN) 10 MG tablet Take 10 mg by mouth daily as needed.     . ondansetron (ZOFRAN) 8 MG tablet Take 1 tablet (8 mg total) by mouth 2 (two) times daily as needed. Start on the third day after chemotherapy. 60 tablet 2  . prochlorperazine (COMPAZINE) 10 MG tablet Take 1 tablet (10 mg total) by mouth every 6 (six) hours as needed (Nausea or vomiting). 60 tablet 2  . rosuvastatin (CRESTOR) 5 MG tablet Take 1 tablet (5 mg total) by mouth every other day. 45 tablet 3  . traMADol (ULTRAM) 50 MG tablet Take 1 tablet (50 mg total) by mouth every 6 (six) hours as needed. 15 tablet 0  . aspirin 81 MG tablet Take 81 mg by mouth daily.     . clobetasol cream (TEMOVATE) 3.71 % Apply 1 application topically 2 (two) times daily. ON ARMS    . hydrocortisone 2.5 % cream Apply 1 application topically 2 (two) times daily. ON FACE    . losartan (COZAAR) 100 MG tablet Take 1 tablet (100 mg total) by mouth daily. (Patient not taking: Reported on 08/16/2016) 90 tablet 3  . Misc Natural Products (OSTEO BI-FLEX JOINT SHIELD PO) Take 2 tablets by mouth daily.    . Multiple Vitamin (MULTIVITAMIN WITH MINERALS) TABS tablet Take 1 tablet by mouth 3 (three) times a week.    . sucralfate (CARAFATE) 1 g tablet Take 1 tablet (1 g total) by mouth 4 (four) times daily -  with meals and at bedtime. (Patient not taking: Reported on 08/09/2016) 120 tablet 12   No current facility-administered medications for this visit.     OBJECTIVE: Vitals:    08/16/16 1141  BP: (!) 176/75  Pulse: 80  Resp: 20  Temp: 97.2 F (36.2 C)     Body mass index is 26.12 kg/m.    ECOG FS:0 - Asymptomatic  General: Well-developed, well-nourished, no acute distress. Eyes: Pink conjunctiva, anicteric sclera.  Ecchymosis mainly surrounding left eye, significantly improved. Breasts: Palpable left breast mass. HEENT: Multiple ecchymosis on face. Lungs: Clear to auscultation bilaterally. Heart: Regular rate and rhythm. No rubs, murmurs, or gallops. Abdomen: Soft, nontender, nondistended. No organomegaly noted, normoactive bowel sounds. Musculoskeletal: No edema, cyanosis, or clubbing. Neuro: Alert, answering all questions appropriately. Cranial nerves grossly intact. Skin: No rashes or petechiae noted. Psych:  Normal affect.   LAB RESULTS:  Lab Results  Component Value Date   NA 137 08/16/2016   K 4.0 08/16/2016   CL 101 08/16/2016   CO2 28 08/16/2016   GLUCOSE 116 (H) 08/16/2016   BUN 17 08/16/2016   CREATININE 1.25 (H) 08/16/2016   CALCIUM 9.6 08/16/2016   PROT 7.0 08/16/2016   ALBUMIN 3.7 08/16/2016   AST 22 08/16/2016   ALT 16 08/16/2016   ALKPHOS 55 08/16/2016   BILITOT 0.5 08/16/2016   GFRNONAA 40 (L) 08/16/2016   GFRAA 46 (L) 08/16/2016    Lab Results  Component Value Date   WBC 5.7 08/16/2016   NEUTROABS 3.4 08/16/2016   HGB 10.3 (L) 08/16/2016   HCT 30.9 (L) 08/16/2016   MCV 82.3 08/16/2016   PLT 380 08/16/2016     STUDIES: Ct Head Wo Contrast  Result Date: 08/06/2016 CLINICAL DATA:  Patient had a witnessed fall, + LOC, and laceration over left orbit. Patient complains of head, neck and jaw pain. She does not remember what happened EXAM: CT HEAD WITHOUT CONTRAST CT MAXILLOFACIAL WITHOUT CONTRAST CT CERVICAL SPINE WITHOUT CONTRAST TECHNIQUE: Multidetector CT imaging of the head, cervical spine, and maxillofacial structures were performed using the standard protocol without intravenous contrast. Multiplanar CT image  reconstructions of the cervical spine and maxillofacial structures were also generated. COMPARISON:  None. FINDINGS: CT HEAD FINDINGS Brain: There is a small focus of hyperattenuation within the sub cortical white matter of the superior medial left frontal lobe, measuring 4 mm in size, consistent with a small parenchymal contusion. No other evidence of intracranial hemorrhage. Ventricles are normal in configuration. There is ventricular sulcal enlargement reflecting age related volume loss. No hydrocephalus. There are no parenchymal masses or mass effect. There is no evidence of an infarct. Mild patchy white matter hypoattenuation is noted consistent with chronic microvascular ischemic change. There are no extra-axial masses or abnormal fluid collections. Vascular: No hyperdense vessel or unexpected calcification. Skull: Normal. Negative for fracture or focal lesion. Other: None. CT MAXILLOFACIAL FINDINGS Osseous: There is a fracture of the mid to posterior aspect of the left orbital floor with no significant depression or displacement. Small amount of extraconal intraorbital air lies adjacent to the fracture. Cell no other fractures. Orbits: Mild preseptal left periorbital soft tissue swelling/ hemorrhage. Small laceration to the left supraorbital forehead. No radiopaque foreign body. Unremarkable globes other than changes from cataract surgery. The postseptal orbits are unremarkable other than the small amount of extraconal air noted on the left associated with the orbital floor fracture. Sinuses: Left maxillary sinuses partly filled with dependent hemorrhage. Remaining visualized sinuses are clear. Clear mastoid air cells and middle ear cavities. Soft tissues: No other soft tissue abnormality. No soft tissue masses or adenopathy. CT CERVICAL SPINE FINDINGS Alignment: Mild reversal the normal cervical lordosis. No spondylolisthesis. Skull base and vertebrae: No fracture. No osteoblastic or osteolytic lesions.  Status post anterior cervical spine fusion from C3 through C6 with an anterior fusion plate, fixation screws and metal intervertebral spacers/cages. The orthopedic hardware appears well seated with no evidence of loosening. Soft tissues and spinal canal: No prevertebral fluid or swelling. No visible canal hematoma. Disc levels: There is moderate loss of disc height with endplate spurring at the C6-C7 level. Endplate spurring along the mid to lower cervical spine these 2 at least mild degrees of central stenosis, which appears chronic. No convincing disc herniation. Neural foramina are well preserved. Upper chest: No acute findings. Thyroid is enlarged with multiple small  nodules. Other: None. IMPRESSION: HEAD CT 1. Small, 4 mm, focus of parenchymal hemorrhage in the superior medial left frontal lobe subcortical white matter. 2. No other acute intracranial abnormality. 3. No skull fracture. MAXILLOFACIAL CT 1. Nondisplaced left orbital floor fracture associated with the left maxillary sinus hemorrhage. No herniation of the orbital contents or entrapment of the extra-ocular musculature. No intraorbital hematoma or inflammation. 2. No other fractures. 3. Left supraorbital forehead laceration and mild left preseptal periorbital soft tissue swelling. CERVICAL CT 1. No fracture or acute finding. 2. No evidence of loosening of the cervical spine fusion hardware. Electronically Signed   By: Lajean Manes M.D.   On: 08/06/2016 17:20   Ct Cervical Spine Wo Contrast  Result Date: 08/06/2016 CLINICAL DATA:  Patient had a witnessed fall, + LOC, and laceration over left orbit. Patient complains of head, neck and jaw pain. She does not remember what happened EXAM: CT HEAD WITHOUT CONTRAST CT MAXILLOFACIAL WITHOUT CONTRAST CT CERVICAL SPINE WITHOUT CONTRAST TECHNIQUE: Multidetector CT imaging of the head, cervical spine, and maxillofacial structures were performed using the standard protocol without intravenous contrast.  Multiplanar CT image reconstructions of the cervical spine and maxillofacial structures were also generated. COMPARISON:  None. FINDINGS: CT HEAD FINDINGS Brain: There is a small focus of hyperattenuation within the sub cortical white matter of the superior medial left frontal lobe, measuring 4 mm in size, consistent with a small parenchymal contusion. No other evidence of intracranial hemorrhage. Ventricles are normal in configuration. There is ventricular sulcal enlargement reflecting age related volume loss. No hydrocephalus. There are no parenchymal masses or mass effect. There is no evidence of an infarct. Mild patchy white matter hypoattenuation is noted consistent with chronic microvascular ischemic change. There are no extra-axial masses or abnormal fluid collections. Vascular: No hyperdense vessel or unexpected calcification. Skull: Normal. Negative for fracture or focal lesion. Other: None. CT MAXILLOFACIAL FINDINGS Osseous: There is a fracture of the mid to posterior aspect of the left orbital floor with no significant depression or displacement. Small amount of extraconal intraorbital air lies adjacent to the fracture. Cell no other fractures. Orbits: Mild preseptal left periorbital soft tissue swelling/ hemorrhage. Small laceration to the left supraorbital forehead. No radiopaque foreign body. Unremarkable globes other than changes from cataract surgery. The postseptal orbits are unremarkable other than the small amount of extraconal air noted on the left associated with the orbital floor fracture. Sinuses: Left maxillary sinuses partly filled with dependent hemorrhage. Remaining visualized sinuses are clear. Clear mastoid air cells and middle ear cavities. Soft tissues: No other soft tissue abnormality. No soft tissue masses or adenopathy. CT CERVICAL SPINE FINDINGS Alignment: Mild reversal the normal cervical lordosis. No spondylolisthesis. Skull base and vertebrae: No fracture. No osteoblastic or  osteolytic lesions. Status post anterior cervical spine fusion from C3 through C6 with an anterior fusion plate, fixation screws and metal intervertebral spacers/cages. The orthopedic hardware appears well seated with no evidence of loosening. Soft tissues and spinal canal: No prevertebral fluid or swelling. No visible canal hematoma. Disc levels: There is moderate loss of disc height with endplate spurring at the C6-C7 level. Endplate spurring along the mid to lower cervical spine these 2 at least mild degrees of central stenosis, which appears chronic. No convincing disc herniation. Neural foramina are well preserved. Upper chest: No acute findings. Thyroid is enlarged with multiple small nodules. Other: None. IMPRESSION: HEAD CT 1. Small, 4 mm, focus of parenchymal hemorrhage in the superior medial left frontal lobe subcortical white matter.  2. No other acute intracranial abnormality. 3. No skull fracture. MAXILLOFACIAL CT 1. Nondisplaced left orbital floor fracture associated with the left maxillary sinus hemorrhage. No herniation of the orbital contents or entrapment of the extra-ocular musculature. No intraorbital hematoma or inflammation. 2. No other fractures. 3. Left supraorbital forehead laceration and mild left preseptal periorbital soft tissue swelling. CERVICAL CT 1. No fracture or acute finding. 2. No evidence of loosening of the cervical spine fusion hardware. Electronically Signed   By: Lajean Manes M.D.   On: 08/06/2016 17:20   Nm Cardiac Muga Rest  Result Date: 08/03/2016 CLINICAL DATA:  Left breast cancer. Pre chemotherapy cardiac assessment. EXAM: NUCLEAR MEDICINE CARDIAC BLOOD POOL IMAGING (MUGA) TECHNIQUE: Cardiac multi-gated acquisition was performed at rest following intravenous injection of Tc-36mlabeled red blood cells. RADIOPHARMACEUTICALS:  21.7 mCi Tc-955mDP in-vitro labeled red blood cells IV COMPARISON:  None. FINDINGS: Normal left ventricular wall motion. No akinetic or  dyskinetic segments are identified. The ejection fraction was calculated at 68.9%. IMPRESSION: Normal left ventricular wall motion with ejection fraction of 68.9%. Electronically Signed   By: P.Marijo Sanes.D.   On: 08/03/2016 12:17   Dg Chest Port 1 View  Result Date: 08/05/2016 CLINICAL DATA:  Port-A-Cath placement. EXAM: PORTABLE CHEST 1 VIEW COMPARISON:  10/16/2013 chest radiograph FINDINGS: Upper limits normal heart size again noted. A right subclavian central venous catheter is present with tip overlying the superior cavoatrial junction. There is no evidence of focal airspace disease, pulmonary edema, suspicious pulmonary nodule/mass, pleural effusion, or pneumothorax. No acute bony abnormalities are identified. IMPRESSION: Right subclavian central venous catheter with tip overlying the superior cavoatrial junction. No evidence of pneumothorax. Electronically Signed   By: JeMargarette Canada.D.   On: 08/05/2016 11:33   Dg C-arm 1-60 Min-no Report  Result Date: 08/05/2016 Fluoroscopy was utilized by the requesting physician.  No radiographic interpretation.   UsKoreareast Complete Uni Left Inc Axilla  Result Date: 07/25/2016 Ultrasound examination of the breast showed ill-defined hypoechoic mass within shadowing at the 3:00 position 1 cm from nipple measuring 1.2 x 1.7 x 1.93 cm. BIRAD-5. The patient was amenable to ultrasound-guided core biopsy. The procedure was reviewed. The breast was prepped with alcohol and 10 mL of 0.5% Xylocaine with 0.25% Marcaine with 1:200,000 epinephrine was utilized well tolerated. ChloraPrep was applied to the skin. The area was approached from a LM direction and the skin was incised sharply with an 11 blade. Initial biopsy was completed making use of a Finesse 12-gauge device. This malfunctioned and the remaining biopsies were completed making use of a 14-gauge Bard spring loaded biopsy device. 5 long cores were obtained. Postbiopsy clip was placed. Scant bleeding was noted.  Benzoin followed by Steri-Strips, Telfa and Tegaderm were applied.   Ct Maxillofacial Wo Contrast  Result Date: 08/06/2016 CLINICAL DATA:  Patient had a witnessed fall, + LOC, and laceration over left orbit. Patient complains of head, neck and jaw pain. She does not remember what happened EXAM: CT HEAD WITHOUT CONTRAST CT MAXILLOFACIAL WITHOUT CONTRAST CT CERVICAL SPINE WITHOUT CONTRAST TECHNIQUE: Multidetector CT imaging of the head, cervical spine, and maxillofacial structures were performed using the standard protocol without intravenous contrast. Multiplanar CT image reconstructions of the cervical spine and maxillofacial structures were also generated. COMPARISON:  None. FINDINGS: CT HEAD FINDINGS Brain: There is a small focus of hyperattenuation within the sub cortical white matter of the superior medial left frontal lobe, measuring 4 mm in size, consistent with a small parenchymal contusion.  No other evidence of intracranial hemorrhage. Ventricles are normal in configuration. There is ventricular sulcal enlargement reflecting age related volume loss. No hydrocephalus. There are no parenchymal masses or mass effect. There is no evidence of an infarct. Mild patchy white matter hypoattenuation is noted consistent with chronic microvascular ischemic change. There are no extra-axial masses or abnormal fluid collections. Vascular: No hyperdense vessel or unexpected calcification. Skull: Normal. Negative for fracture or focal lesion. Other: None. CT MAXILLOFACIAL FINDINGS Osseous: There is a fracture of the mid to posterior aspect of the left orbital floor with no significant depression or displacement. Small amount of extraconal intraorbital air lies adjacent to the fracture. Cell no other fractures. Orbits: Mild preseptal left periorbital soft tissue swelling/ hemorrhage. Small laceration to the left supraorbital forehead. No radiopaque foreign body. Unremarkable globes other than changes from cataract  surgery. The postseptal orbits are unremarkable other than the small amount of extraconal air noted on the left associated with the orbital floor fracture. Sinuses: Left maxillary sinuses partly filled with dependent hemorrhage. Remaining visualized sinuses are clear. Clear mastoid air cells and middle ear cavities. Soft tissues: No other soft tissue abnormality. No soft tissue masses or adenopathy. CT CERVICAL SPINE FINDINGS Alignment: Mild reversal the normal cervical lordosis. No spondylolisthesis. Skull base and vertebrae: No fracture. No osteoblastic or osteolytic lesions. Status post anterior cervical spine fusion from C3 through C6 with an anterior fusion plate, fixation screws and metal intervertebral spacers/cages. The orthopedic hardware appears well seated with no evidence of loosening. Soft tissues and spinal canal: No prevertebral fluid or swelling. No visible canal hematoma. Disc levels: There is moderate loss of disc height with endplate spurring at the C6-C7 level. Endplate spurring along the mid to lower cervical spine these 2 at least mild degrees of central stenosis, which appears chronic. No convincing disc herniation. Neural foramina are well preserved. Upper chest: No acute findings. Thyroid is enlarged with multiple small nodules. Other: None. IMPRESSION: HEAD CT 1. Small, 4 mm, focus of parenchymal hemorrhage in the superior medial left frontal lobe subcortical white matter. 2. No other acute intracranial abnormality. 3. No skull fracture. MAXILLOFACIAL CT 1. Nondisplaced left orbital floor fracture associated with the left maxillary sinus hemorrhage. No herniation of the orbital contents or entrapment of the extra-ocular musculature. No intraorbital hematoma or inflammation. 2. No other fractures. 3. Left supraorbital forehead laceration and mild left preseptal periorbital soft tissue swelling. CERVICAL CT 1. No fracture or acute finding. 2. No evidence of loosening of the cervical spine  fusion hardware. Electronically Signed   By: Lajean Manes M.D.   On: 08/06/2016 17:20    ASSESSMENT: Clinical stage Ib ER/PR positive, HER-2 negative invasive carcinoma of the upper outer quadrant of the left breast.  PLAN:    1. Clinical stage Ib ER/PR positive, HER-2 negative invasive carcinoma of the upper outer quadrant of the left breast: Given the size of patient's breast mass at 2.7 cm, have recommended neoadjuvant chemotherapy using Adriamycin, Cytoxan, and Taxol. She will also require Neulasta support. Pretreatment MUGA scan revealed an EF of 69%. Once she completes neoadjuvant chemotherapy, will refer back to surgery for possible lumpectomy. Patient will also require adjuvant XRT. Finally, given the ER/PR status of her tumor she will benefit from an aromatase inhibitor for 5 years. Proceed with cycle 1 of 4 of Adriamycin and Cytoxan today. Return to clinic in 1 week for laboratory work and further evaluation and then in 2 weeks for consideration of cycle 2.  2. Syncopal  episode: Unclear etiology, patient was evaluated in the emergency room on day of her injuries. 3. Subdural hematoma: Secondary to fall. Monitor. 4. Anemia: Mild, monitor. 5. Renal insufficiency: Mild, monitor.   Patient expressed understanding and was in agreement with this plan. She also understands that She can call clinic at any time with any questions, concerns, or complaints.   Cancer Staging Primary cancer of upper outer quadrant of left female breast [facility]) Staging form: Breast, AJCC 8th Edition - Clinical stage from 07/28/2016: Stage IB (cT2, cN0, cM0, G2, ER: Positive, PR: Positive, HER2: Negative) - Signed by Lloyd Huger, MD on 07/28/2016   Lloyd Huger, MD   08/20/2016 7:09 AM

## 2016-08-16 ENCOUNTER — Inpatient Hospital Stay: Payer: PPO

## 2016-08-16 ENCOUNTER — Inpatient Hospital Stay (HOSPITAL_BASED_OUTPATIENT_CLINIC_OR_DEPARTMENT_OTHER): Payer: PPO | Admitting: Oncology

## 2016-08-16 VITALS — BP 176/75 | HR 80 | Temp 97.2°F | Resp 20 | Wt 176.9 lb

## 2016-08-16 DIAGNOSIS — Z7982 Long term (current) use of aspirin: Secondary | ICD-10-CM

## 2016-08-16 DIAGNOSIS — K219 Gastro-esophageal reflux disease without esophagitis: Secondary | ICD-10-CM

## 2016-08-16 DIAGNOSIS — C50412 Malignant neoplasm of upper-outer quadrant of left female breast: Secondary | ICD-10-CM

## 2016-08-16 DIAGNOSIS — N289 Disorder of kidney and ureter, unspecified: Secondary | ICD-10-CM

## 2016-08-16 DIAGNOSIS — Z170 Estrogen receptor positive status [ER+]: Secondary | ICD-10-CM

## 2016-08-16 DIAGNOSIS — M129 Arthropathy, unspecified: Secondary | ICD-10-CM | POA: Diagnosis not present

## 2016-08-16 DIAGNOSIS — Z7689 Persons encountering health services in other specified circumstances: Secondary | ICD-10-CM

## 2016-08-16 DIAGNOSIS — E785 Hyperlipidemia, unspecified: Secondary | ICD-10-CM

## 2016-08-16 DIAGNOSIS — I10 Essential (primary) hypertension: Secondary | ICD-10-CM

## 2016-08-16 DIAGNOSIS — J45909 Unspecified asthma, uncomplicated: Secondary | ICD-10-CM | POA: Diagnosis not present

## 2016-08-16 DIAGNOSIS — D649 Anemia, unspecified: Secondary | ICD-10-CM | POA: Diagnosis not present

## 2016-08-16 DIAGNOSIS — Z803 Family history of malignant neoplasm of breast: Secondary | ICD-10-CM

## 2016-08-16 DIAGNOSIS — R55 Syncope and collapse: Secondary | ICD-10-CM | POA: Diagnosis not present

## 2016-08-16 DIAGNOSIS — I6200 Nontraumatic subdural hemorrhage, unspecified: Secondary | ICD-10-CM

## 2016-08-16 DIAGNOSIS — Z5111 Encounter for antineoplastic chemotherapy: Secondary | ICD-10-CM | POA: Diagnosis not present

## 2016-08-16 DIAGNOSIS — F419 Anxiety disorder, unspecified: Secondary | ICD-10-CM | POA: Diagnosis not present

## 2016-08-16 DIAGNOSIS — Z79899 Other long term (current) drug therapy: Secondary | ICD-10-CM

## 2016-08-16 LAB — COMPREHENSIVE METABOLIC PANEL
ALBUMIN: 3.7 g/dL (ref 3.5–5.0)
ALT: 16 U/L (ref 14–54)
AST: 22 U/L (ref 15–41)
Alkaline Phosphatase: 55 U/L (ref 38–126)
Anion gap: 8 (ref 5–15)
BUN: 17 mg/dL (ref 6–20)
CHLORIDE: 101 mmol/L (ref 101–111)
CO2: 28 mmol/L (ref 22–32)
Calcium: 9.6 mg/dL (ref 8.9–10.3)
Creatinine, Ser: 1.25 mg/dL — ABNORMAL HIGH (ref 0.44–1.00)
GFR calc Af Amer: 46 mL/min — ABNORMAL LOW (ref >60)
GFR calc non Af Amer: 40 mL/min — ABNORMAL LOW (ref >60)
Glucose, Bld: 116 mg/dL — ABNORMAL HIGH (ref 65–99)
POTASSIUM: 4 mmol/L (ref 3.5–5.1)
Sodium: 137 mmol/L (ref 135–145)
Total Bilirubin: 0.5 mg/dL (ref 0.3–1.2)
Total Protein: 7 g/dL (ref 6.5–8.1)

## 2016-08-16 LAB — CBC WITH DIFFERENTIAL/PLATELET
BASOS ABS: 0.1 10*3/uL (ref 0–0.1)
BASOS PCT: 1 %
EOS PCT: 6 %
Eosinophils Absolute: 0.3 10*3/uL (ref 0–0.7)
HEMATOCRIT: 30.9 % — AB (ref 35.0–47.0)
Hemoglobin: 10.3 g/dL — ABNORMAL LOW (ref 12.0–16.0)
Lymphocytes Relative: 24 %
Lymphs Abs: 1.4 10*3/uL (ref 1.0–3.6)
MCH: 27.4 pg (ref 26.0–34.0)
MCHC: 33.3 g/dL (ref 32.0–36.0)
MCV: 82.3 fL (ref 80.0–100.0)
MONO ABS: 0.4 10*3/uL (ref 0.2–0.9)
Monocytes Relative: 8 %
NEUTROS ABS: 3.4 10*3/uL (ref 1.4–6.5)
Neutrophils Relative %: 61 %
PLATELETS: 380 10*3/uL (ref 150–440)
RBC: 3.76 MIL/uL — ABNORMAL LOW (ref 3.80–5.20)
RDW: 15.6 % — AB (ref 11.5–14.5)
WBC: 5.7 10*3/uL (ref 3.6–11.0)

## 2016-08-16 MED ORDER — HEPARIN SOD (PORK) LOCK FLUSH 100 UNIT/ML IV SOLN
500.0000 [IU] | Freq: Once | INTRAVENOUS | Status: AC | PRN
  Administered 2016-08-16: 500 [IU]
  Filled 2016-08-16: qty 5

## 2016-08-16 MED ORDER — SODIUM CHLORIDE 0.9 % IV SOLN
Freq: Once | INTRAVENOUS | Status: AC
  Administered 2016-08-16: 13:00:00 via INTRAVENOUS
  Filled 2016-08-16: qty 5

## 2016-08-16 MED ORDER — DOXORUBICIN HCL CHEMO IV INJECTION 2 MG/ML
60.0000 mg/m2 | Freq: Once | INTRAVENOUS | Status: AC
  Administered 2016-08-16: 118 mg via INTRAVENOUS
  Filled 2016-08-16: qty 59

## 2016-08-16 MED ORDER — PALONOSETRON HCL INJECTION 0.25 MG/5ML
0.2500 mg | Freq: Once | INTRAVENOUS | Status: AC
  Administered 2016-08-16: 0.25 mg via INTRAVENOUS
  Filled 2016-08-16: qty 5

## 2016-08-16 MED ORDER — SODIUM CHLORIDE 0.9 % IV SOLN
Freq: Once | INTRAVENOUS | Status: AC
  Administered 2016-08-16: 13:00:00 via INTRAVENOUS
  Filled 2016-08-16: qty 1000

## 2016-08-16 MED ORDER — SODIUM CHLORIDE 0.9% FLUSH
10.0000 mL | INTRAVENOUS | Status: DC | PRN
  Administered 2016-08-16: 10 mL
  Filled 2016-08-16: qty 10

## 2016-08-16 MED ORDER — SODIUM CHLORIDE 0.9 % IV SOLN
600.0000 mg/m2 | Freq: Once | INTRAVENOUS | Status: AC
  Administered 2016-08-16: 1180 mg via INTRAVENOUS
  Filled 2016-08-16: qty 50

## 2016-08-16 MED ORDER — PEGFILGRASTIM 6 MG/0.6ML ~~LOC~~ PSKT
6.0000 mg | PREFILLED_SYRINGE | Freq: Once | SUBCUTANEOUS | Status: AC
  Administered 2016-08-16: 6 mg via SUBCUTANEOUS
  Filled 2016-08-16: qty 0.6

## 2016-08-16 NOTE — Progress Notes (Signed)
Patient denies any concerns today.  

## 2016-08-22 ENCOUNTER — Inpatient Hospital Stay: Payer: PPO | Attending: Oncology

## 2016-08-22 DIAGNOSIS — D6481 Anemia due to antineoplastic chemotherapy: Secondary | ICD-10-CM | POA: Diagnosis not present

## 2016-08-22 DIAGNOSIS — Z803 Family history of malignant neoplasm of breast: Secondary | ICD-10-CM | POA: Insufficient documentation

## 2016-08-22 DIAGNOSIS — F419 Anxiety disorder, unspecified: Secondary | ICD-10-CM | POA: Insufficient documentation

## 2016-08-22 DIAGNOSIS — N289 Disorder of kidney and ureter, unspecified: Secondary | ICD-10-CM | POA: Insufficient documentation

## 2016-08-22 DIAGNOSIS — M129 Arthropathy, unspecified: Secondary | ICD-10-CM | POA: Insufficient documentation

## 2016-08-22 DIAGNOSIS — C50412 Malignant neoplasm of upper-outer quadrant of left female breast: Secondary | ICD-10-CM | POA: Diagnosis not present

## 2016-08-22 DIAGNOSIS — S0181XA Laceration without foreign body of other part of head, initial encounter: Secondary | ICD-10-CM | POA: Insufficient documentation

## 2016-08-22 DIAGNOSIS — Z7689 Persons encountering health services in other specified circumstances: Secondary | ICD-10-CM | POA: Insufficient documentation

## 2016-08-22 DIAGNOSIS — Z7982 Long term (current) use of aspirin: Secondary | ICD-10-CM | POA: Insufficient documentation

## 2016-08-22 DIAGNOSIS — Z5111 Encounter for antineoplastic chemotherapy: Secondary | ICD-10-CM | POA: Insufficient documentation

## 2016-08-22 DIAGNOSIS — R300 Dysuria: Secondary | ICD-10-CM | POA: Diagnosis not present

## 2016-08-22 DIAGNOSIS — K219 Gastro-esophageal reflux disease without esophagitis: Secondary | ICD-10-CM | POA: Insufficient documentation

## 2016-08-22 DIAGNOSIS — E785 Hyperlipidemia, unspecified: Secondary | ICD-10-CM | POA: Insufficient documentation

## 2016-08-22 DIAGNOSIS — I10 Essential (primary) hypertension: Secondary | ICD-10-CM | POA: Diagnosis not present

## 2016-08-22 DIAGNOSIS — Z79899 Other long term (current) drug therapy: Secondary | ICD-10-CM | POA: Diagnosis not present

## 2016-08-22 DIAGNOSIS — M4802 Spinal stenosis, cervical region: Secondary | ICD-10-CM | POA: Diagnosis not present

## 2016-08-22 DIAGNOSIS — Z170 Estrogen receptor positive status [ER+]: Secondary | ICD-10-CM | POA: Diagnosis not present

## 2016-08-22 LAB — CBC WITH DIFFERENTIAL/PLATELET
BASOS ABS: 0 10*3/uL (ref 0–0.1)
BASOS PCT: 2 %
EOS ABS: 0.1 10*3/uL (ref 0–0.7)
EOS PCT: 13 %
HCT: 29.1 % — ABNORMAL LOW (ref 35.0–47.0)
Hemoglobin: 9.8 g/dL — ABNORMAL LOW (ref 12.0–16.0)
Lymphocytes Relative: 65 %
Lymphs Abs: 0.6 10*3/uL — ABNORMAL LOW (ref 1.0–3.6)
MCH: 28 pg (ref 26.0–34.0)
MCHC: 33.5 g/dL (ref 32.0–36.0)
MCV: 83.5 fL (ref 80.0–100.0)
Monocytes Absolute: 0 10*3/uL — ABNORMAL LOW (ref 0.2–0.9)
Monocytes Relative: 3 %
Neutro Abs: 0.2 10*3/uL — ABNORMAL LOW (ref 1.4–6.5)
Neutrophils Relative %: 18 %
PLATELETS: 180 10*3/uL (ref 150–440)
RBC: 3.49 MIL/uL — ABNORMAL LOW (ref 3.80–5.20)
RDW: 15.7 % — AB (ref 11.5–14.5)
WBC: 0.9 10*3/uL — CL (ref 3.6–11.0)

## 2016-08-22 LAB — COMPREHENSIVE METABOLIC PANEL
ALBUMIN: 3.6 g/dL (ref 3.5–5.0)
ALT: 14 U/L (ref 14–54)
AST: 23 U/L (ref 15–41)
Alkaline Phosphatase: 61 U/L (ref 38–126)
Anion gap: 6 (ref 5–15)
BUN: 21 mg/dL — ABNORMAL HIGH (ref 6–20)
CHLORIDE: 103 mmol/L (ref 101–111)
CO2: 29 mmol/L (ref 22–32)
Calcium: 9.5 mg/dL (ref 8.9–10.3)
Creatinine, Ser: 1.08 mg/dL — ABNORMAL HIGH (ref 0.44–1.00)
GFR calc Af Amer: 55 mL/min — ABNORMAL LOW (ref >60)
GFR calc non Af Amer: 48 mL/min — ABNORMAL LOW (ref >60)
GLUCOSE: 114 mg/dL — AB (ref 65–99)
POTASSIUM: 4.3 mmol/L (ref 3.5–5.1)
Sodium: 138 mmol/L (ref 135–145)
Total Bilirubin: 0.6 mg/dL (ref 0.3–1.2)
Total Protein: 6.8 g/dL (ref 6.5–8.1)

## 2016-08-30 ENCOUNTER — Inpatient Hospital Stay: Payer: PPO

## 2016-08-30 ENCOUNTER — Inpatient Hospital Stay (HOSPITAL_BASED_OUTPATIENT_CLINIC_OR_DEPARTMENT_OTHER): Payer: PPO | Admitting: Oncology

## 2016-08-30 VITALS — BP 155/70 | HR 82 | Temp 98.7°F | Resp 18 | Wt 166.9 lb

## 2016-08-30 DIAGNOSIS — M4802 Spinal stenosis, cervical region: Secondary | ICD-10-CM | POA: Diagnosis not present

## 2016-08-30 DIAGNOSIS — Z79899 Other long term (current) drug therapy: Secondary | ICD-10-CM

## 2016-08-30 DIAGNOSIS — Z5111 Encounter for antineoplastic chemotherapy: Secondary | ICD-10-CM

## 2016-08-30 DIAGNOSIS — K219 Gastro-esophageal reflux disease without esophagitis: Secondary | ICD-10-CM | POA: Diagnosis not present

## 2016-08-30 DIAGNOSIS — I10 Essential (primary) hypertension: Secondary | ICD-10-CM

## 2016-08-30 DIAGNOSIS — S0181XA Laceration without foreign body of other part of head, initial encounter: Secondary | ICD-10-CM

## 2016-08-30 DIAGNOSIS — E785 Hyperlipidemia, unspecified: Secondary | ICD-10-CM | POA: Diagnosis not present

## 2016-08-30 DIAGNOSIS — Z803 Family history of malignant neoplasm of breast: Secondary | ICD-10-CM

## 2016-08-30 DIAGNOSIS — F419 Anxiety disorder, unspecified: Secondary | ICD-10-CM | POA: Diagnosis not present

## 2016-08-30 DIAGNOSIS — C50412 Malignant neoplasm of upper-outer quadrant of left female breast: Secondary | ICD-10-CM

## 2016-08-30 DIAGNOSIS — M129 Arthropathy, unspecified: Secondary | ICD-10-CM | POA: Diagnosis not present

## 2016-08-30 DIAGNOSIS — D6481 Anemia due to antineoplastic chemotherapy: Secondary | ICD-10-CM

## 2016-08-30 DIAGNOSIS — Z7689 Persons encountering health services in other specified circumstances: Secondary | ICD-10-CM

## 2016-08-30 DIAGNOSIS — Z7982 Long term (current) use of aspirin: Secondary | ICD-10-CM | POA: Diagnosis not present

## 2016-08-30 LAB — COMPREHENSIVE METABOLIC PANEL
ALT: 21 U/L (ref 14–54)
AST: 23 U/L (ref 15–41)
Albumin: 3.6 g/dL (ref 3.5–5.0)
Alkaline Phosphatase: 70 U/L (ref 38–126)
Anion gap: 8 (ref 5–15)
BILIRUBIN TOTAL: 0.3 mg/dL (ref 0.3–1.2)
BUN: 13 mg/dL (ref 6–20)
CHLORIDE: 103 mmol/L (ref 101–111)
CO2: 29 mmol/L (ref 22–32)
CREATININE: 1.11 mg/dL — AB (ref 0.44–1.00)
Calcium: 9.5 mg/dL (ref 8.9–10.3)
GFR calc Af Amer: 53 mL/min — ABNORMAL LOW (ref >60)
GFR, EST NON AFRICAN AMERICAN: 46 mL/min — AB (ref >60)
Glucose, Bld: 104 mg/dL — ABNORMAL HIGH (ref 65–99)
Potassium: 3.8 mmol/L (ref 3.5–5.1)
Sodium: 140 mmol/L (ref 135–145)
Total Protein: 6.7 g/dL (ref 6.5–8.1)

## 2016-08-30 LAB — CBC WITH DIFFERENTIAL/PLATELET
BASOS ABS: 0 10*3/uL (ref 0–0.1)
Basophils Relative: 0 %
Eosinophils Absolute: 0 10*3/uL (ref 0–0.7)
Eosinophils Relative: 0 %
HEMATOCRIT: 28.9 % — AB (ref 35.0–47.0)
Hemoglobin: 9.7 g/dL — ABNORMAL LOW (ref 12.0–16.0)
LYMPHS PCT: 14 %
Lymphs Abs: 1.6 10*3/uL (ref 1.0–3.6)
MCH: 27.9 pg (ref 26.0–34.0)
MCHC: 33.4 g/dL (ref 32.0–36.0)
MCV: 83.4 fL (ref 80.0–100.0)
Monocytes Absolute: 1 10*3/uL — ABNORMAL HIGH (ref 0.2–0.9)
Monocytes Relative: 9 %
NEUTROS ABS: 8.8 10*3/uL — AB (ref 1.4–6.5)
NEUTROS PCT: 77 %
PLATELETS: 243 10*3/uL (ref 150–440)
RBC: 3.47 MIL/uL — AB (ref 3.80–5.20)
RDW: 15.6 % — ABNORMAL HIGH (ref 11.5–14.5)
WBC: 11.4 10*3/uL — AB (ref 3.6–11.0)

## 2016-08-30 MED ORDER — HEPARIN SOD (PORK) LOCK FLUSH 100 UNIT/ML IV SOLN
500.0000 [IU] | Freq: Once | INTRAVENOUS | Status: AC
  Administered 2016-08-30: 500 [IU] via INTRAVENOUS

## 2016-08-30 MED ORDER — SODIUM CHLORIDE 0.9% FLUSH
10.0000 mL | INTRAVENOUS | Status: DC | PRN
  Administered 2016-08-30: 10 mL
  Filled 2016-08-30: qty 10

## 2016-08-30 MED ORDER — SODIUM CHLORIDE 0.9% FLUSH
10.0000 mL | Freq: Once | INTRAVENOUS | Status: AC
  Administered 2016-08-30: 10 mL via INTRAVENOUS
  Filled 2016-08-30: qty 10

## 2016-08-30 MED ORDER — SODIUM CHLORIDE 0.9 % IV SOLN
600.0000 mg/m2 | Freq: Once | INTRAVENOUS | Status: AC
  Administered 2016-08-30: 1180 mg via INTRAVENOUS
  Filled 2016-08-30: qty 50

## 2016-08-30 MED ORDER — PALONOSETRON HCL INJECTION 0.25 MG/5ML
0.2500 mg | Freq: Once | INTRAVENOUS | Status: AC
  Administered 2016-08-30: 0.25 mg via INTRAVENOUS
  Filled 2016-08-30: qty 5

## 2016-08-30 MED ORDER — SODIUM CHLORIDE 0.9 % IV SOLN
Freq: Once | INTRAVENOUS | Status: AC
  Administered 2016-08-30: 15:00:00 via INTRAVENOUS
  Filled 2016-08-30: qty 5

## 2016-08-30 MED ORDER — DOXORUBICIN HCL CHEMO IV INJECTION 2 MG/ML
60.0000 mg/m2 | Freq: Once | INTRAVENOUS | Status: AC
  Administered 2016-08-30: 118 mg via INTRAVENOUS
  Filled 2016-08-30: qty 59

## 2016-08-30 MED ORDER — SODIUM CHLORIDE 0.9 % IV SOLN
Freq: Once | INTRAVENOUS | Status: AC
  Administered 2016-08-30: 15:00:00 via INTRAVENOUS
  Filled 2016-08-30: qty 1000

## 2016-08-30 MED ORDER — HEPARIN SOD (PORK) LOCK FLUSH 100 UNIT/ML IV SOLN
500.0000 [IU] | Freq: Once | INTRAVENOUS | Status: DC | PRN
  Filled 2016-08-30: qty 5

## 2016-08-30 MED ORDER — PEGFILGRASTIM 6 MG/0.6ML ~~LOC~~ PSKT
6.0000 mg | PREFILLED_SYRINGE | Freq: Once | SUBCUTANEOUS | Status: AC
  Administered 2016-08-30: 6 mg via SUBCUTANEOUS
  Filled 2016-08-30: qty 0.6

## 2016-08-30 NOTE — Progress Notes (Signed)
Pt here for follow up appt. Pt stated on may 19/18 she fell hitting head-noted left area where she had sutures. Lip laceration w stitches. Pt stated headache daily and head " doesn't feel right " encouraged to see PCP-Dr Rosanna Randy .

## 2016-08-31 ENCOUNTER — Encounter: Payer: Self-pay | Admitting: Oncology

## 2016-08-31 NOTE — Progress Notes (Signed)
Hematology/Oncology Consult note [facility]  [phone] [phone]  Patient Care Team: Jerrol Banana., MD as PCP - General (Unknown Physician Specialty) Jerrol Banana., MD (Family Medicine) Robert Bellow, MD (General Surgery)   Name of the patient: Sandra Fritz  [id number]  02-03-1935   Date of visit: 08/31/16  Diagnosis- Stage IB T2N0M0 ER PR positive her 2 negative invasive mammary carcinoma of left breast  Chief complaint/ Reason for visit- on treatment assessment prior to cycle 2 of dose dense AC  Heme/Onc history: 82 yr old female with Stage IB T2N0M0 ER PR positive her 2 negative invasive mammary carcinoma of left breast. Breast mass size was 2.7 cm and neoadjuvant chemotherapy recommended by Dr. Grayland Ormond  Interval history- she tolerated cycle 1 of chemotherapy well. Continues to have pain around the eye and headache since the fall.   Review of systems- Review of Systems  Constitutional: Negative for chills, fever, malaise/fatigue and weight loss.  HENT: Negative for congestion, ear discharge and nosebleeds.   Eyes: Positive for pain. Negative for blurred vision.  Respiratory: Negative for cough, hemoptysis, sputum production, shortness of breath and wheezing.   Cardiovascular: Negative for chest pain, palpitations, orthopnea and claudication.  Gastrointestinal: Negative for abdominal pain, blood in stool, constipation, diarrhea, heartburn, melena, nausea and vomiting.  Genitourinary: Negative for dysuria, flank pain, frequency, hematuria and urgency.  Musculoskeletal: Negative for back pain, joint pain and myalgias.  Skin: Negative for rash.  Neurological: Positive for headaches. Negative for dizziness, tingling, focal weakness, seizures and weakness.  Endo/Heme/Allergies: Does not bruise/bleed easily.  Psychiatric/Behavioral: Negative for depression and suicidal ideas. The patient does not have  insomnia.       Allergies  Allergen Reactions  . Keflex  [Cephalexin]     hives  . Statins     Muscle and joint pain-patient states she tried all statins.  . Erythromycin Rash    ALL MYCINS     Past Medical History:  Diagnosis Date  . Anemia   . Anxiety   . Arthritis   . Blood transfusion without reported diagnosis   . Cataract   . Complication of anesthesia    HARD TO WAKE UP AFTER TONSILLECTOMY  . GERD (gastroesophageal reflux disease)   . Hyperlipidemia   . Hypertension   . Neuromuscular disorder ([place])   . Primary cancer of upper outer quadrant of left female breast ([place]) 07/28/2016     Past Surgical History:  Procedure Laterality Date  . APPENDECTOMY  1955   SECONDARY TO RUTURED APPENDIX  . CATARACT EXTRACTION, BILATERAL  2011  . COLONOSCOPY  2002  . DILATION AND CURETTAGE OF UTERUS  1989   Dr. Laurey Morale  . JOINT REPLACEMENT     Right hip  . MOLE REMOVAL  1957   18 removed by Dr. Hoyle Sauer  . NECK SURGERY    . PORTACATH PLACEMENT Right 08/05/2016   Procedure: INSERTION PORT-A-CATH WITH ULTRASOUND;  Surgeon: Robert Bellow, MD;  Location: [facility] ORS;  Service: General;  Laterality: Right;  . TONSILLECTOMY  1955    Social History   Social History  . Marital status: Widowed    Spouse name: N/A  . Number of children: N/A  . Years of education: N/A   Occupational History  . Not on file.   Social History Main Topics  . Smoking status: Never Smoker  . Smokeless tobacco: Never Used  . Alcohol use No     Comment: Maybe  3 per year  . Drug use: No  . Sexual activity: No   Other Topics Concern  . Not on file   Social History Narrative  . No narrative on file    Family History  Problem Relation Age of Onset  . Macular degeneration Mother   . Hypertension Mother   . Diabetes Father   . Heart disease Father   . Heart attack Father 36  . Heart disease Brother 49  . Dermatomyositis Sister   . Cancer Sister        Carcinoma of the uterine call  attached to colon and is in remission now  . Breast cancer Maternal Aunt 70  . Breast cancer Paternal Aunt 53  . Breast cancer Cousin        paternal aunt w/ breast ca daughter  . Breast cancer Maternal Aunt 60     Current Outpatient Prescriptions:  .  esomeprazole (NEXIUM) 20 MG capsule, Take 1 capsule (20 mg total) by mouth daily., Disp: 90 capsule, Rfl: 3 .  furosemide (LASIX) 20 MG tablet, TAKE 1 TABLET (20 MG TOTAL) BY MOUTH 2 (TWO) TIMES DAILY. (Patient taking differently: Take 20 mg by mouth 2 (two) times daily. Takes 1 daily and and 2nd dose only if needed for fluid retention), Disp: 180 tablet, Rfl: 3 .  lidocaine-prilocaine (EMLA) cream, Apply to affected area once, Disp: 30 g, Rfl: 3 .  loratadine (CLARITIN) 10 MG tablet, Take 10 mg by mouth daily as needed. , Disp: , Rfl:  .  rosuvastatin (CRESTOR) 5 MG tablet, Take 1 tablet (5 mg total) by mouth every other day., Disp: 45 tablet, Rfl: 3 .  aspirin 81 MG tablet, Take 81 mg by mouth daily. , Disp: , Rfl:  .  clindamycin (CLEOCIN) 300 MG capsule, Take 1 capsule (300 mg total) by mouth 4 (four) times daily. X 7 days (Patient not taking: Reported on 08/30/2016), Disp: 28 capsule, Rfl: 0 .  clobetasol cream (TEMOVATE) 3.61 %, Apply 1 application topically 2 (two) times daily. ON ARMS, Disp: , Rfl:  .  hydrocortisone 2.5 % cream, Apply 1 application topically 2 (two) times daily. ON FACE, Disp: , Rfl:  .  losartan (COZAAR) 100 MG tablet, Take 1 tablet (100 mg total) by mouth daily. (Patient not taking: Reported on 08/16/2016), Disp: 90 tablet, Rfl: 3 .  losartan-hydrochlorothiazide (HYZAAR) 100-12.5 MG tablet, , Disp: , Rfl:  .  Misc Natural Products (OSTEO BI-FLEX JOINT SHIELD PO), Take 2 tablets by mouth daily., Disp: , Rfl:  .  Multiple Vitamin (MULTIVITAMIN WITH MINERALS) TABS tablet, Take 1 tablet by mouth 3 (three) times a week., Disp: , Rfl:  .  ondansetron (ZOFRAN) 8 MG tablet, Take 1 tablet (8 mg total) by mouth 2 (two) times  daily as needed. Start on the third day after chemotherapy. (Patient not taking: Reported on 08/30/2016), Disp: 60 tablet, Rfl: 2 .  prochlorperazine (COMPAZINE) 10 MG tablet, Take 1 tablet (10 mg total) by mouth every 6 (six) hours as needed (Nausea or vomiting). (Patient not taking: Reported on 08/30/2016), Disp: 60 tablet, Rfl: 2 .  sucralfate (CARAFATE) 1 g tablet, Take 1 tablet (1 g total) by mouth 4 (four) times daily -  with meals and at bedtime. (Patient not taking: Reported on 08/09/2016), Disp: 120 tablet, Rfl: 12 .  traMADol (ULTRAM) 50 MG tablet, Take 1 tablet (50 mg total) by mouth every 6 (six) hours as needed. (Patient not taking: Reported on 08/30/2016), Disp: 15 tablet, Rfl:  0  Physical exam:  Vitals:   08/30/16 1402  BP: (!) 155/70  Pulse: 82  Resp: 18  Temp: 98.7 F (37.1 C)  TempSrc: Tympanic  Weight: 166 lb 14.4 oz (75.7 kg)   Physical Exam  Constitutional: She is oriented to person, place, and time.  Elderly lady in no acute distress. Evidence of recent trauma seen over left forehead and around her lip  HENT:  Head: Normocephalic and atraumatic.  Eyes: EOM are normal. Pupils are equal, round, and reactive to light.  Neck: Normal range of motion.  Cardiovascular: Normal rate, regular rhythm and normal heart sounds.   Pulmonary/Chest: Effort normal and breath sounds normal.  Abdominal: Soft. Bowel sounds are normal.  Neurological: She is alert and oriented to person, place, and time.  Skin: Skin is warm and dry.     CMP Latest Ref Rng & Units 08/30/2016  Glucose 65 - 99 mg/dL 104(H)  BUN 6 - 20 mg/dL 13  Creatinine 0.44 - 1.00 mg/dL 1.11(H)  Sodium 135 - 145 mmol/L 140  Potassium 3.5 - 5.1 mmol/L 3.8  Chloride 101 - 111 mmol/L 103  CO2 22 - 32 mmol/L 29  Calcium 8.9 - 10.3 mg/dL 9.5  Total Protein 6.5 - 8.1 g/dL 6.7  Total Bilirubin 0.3 - 1.2 mg/dL 0.3  Alkaline Phos 38 - 126 U/L 70  AST 15 - 41 U/L 23  ALT 14 - 54 U/L 21   CBC Latest Ref Rng & Units  08/30/2016  WBC 3.6 - 11.0 K/uL 11.4(H)  Hemoglobin 12.0 - 16.0 g/dL 9.7(L)  Hematocrit 35.0 - 47.0 % 28.9(L)  Platelets 150 - 440 K/uL 243    No images are attached to the encounter.  Ct Head Wo Contrast  Result Date: 08/06/2016 CLINICAL DATA:  Patient had a witnessed fall, + LOC, and laceration over left orbit. Patient complains of head, neck and jaw pain. She does not remember what happened EXAM: CT HEAD WITHOUT CONTRAST CT MAXILLOFACIAL WITHOUT CONTRAST CT CERVICAL SPINE WITHOUT CONTRAST TECHNIQUE: Multidetector CT imaging of the head, cervical spine, and maxillofacial structures were performed using the standard protocol without intravenous contrast. Multiplanar CT image reconstructions of the cervical spine and maxillofacial structures were also generated. COMPARISON:  None. FINDINGS: CT HEAD FINDINGS Brain: There is a small focus of hyperattenuation within the sub cortical white matter of the superior medial left frontal lobe, measuring 4 mm in size, consistent with a small parenchymal contusion. No other evidence of intracranial hemorrhage. Ventricles are normal in configuration. There is ventricular sulcal enlargement reflecting age related volume loss. No hydrocephalus. There are no parenchymal masses or mass effect. There is no evidence of an infarct. Mild patchy white matter hypoattenuation is noted consistent with chronic microvascular ischemic change. There are no extra-axial masses or abnormal fluid collections. Vascular: No hyperdense vessel or unexpected calcification. Skull: Normal. Negative for fracture or focal lesion. Other: None. CT MAXILLOFACIAL FINDINGS Osseous: There is a fracture of the mid to posterior aspect of the left orbital floor with no significant depression or displacement. Small amount of extraconal intraorbital air lies adjacent to the fracture. Cell no other fractures. Orbits: Mild preseptal left periorbital soft tissue swelling/ hemorrhage. Small laceration to the  left supraorbital forehead. No radiopaque foreign body. Unremarkable globes other than changes from cataract surgery. The postseptal orbits are unremarkable other than the small amount of extraconal air noted on the left associated with the orbital floor fracture. Sinuses: Left maxillary sinuses partly filled with dependent hemorrhage. Remaining visualized  sinuses are clear. Clear mastoid air cells and middle ear cavities. Soft tissues: No other soft tissue abnormality. No soft tissue masses or adenopathy. CT CERVICAL SPINE FINDINGS Alignment: Mild reversal the normal cervical lordosis. No spondylolisthesis. Skull base and vertebrae: No fracture. No osteoblastic or osteolytic lesions. Status post anterior cervical spine fusion from C3 through C6 with an anterior fusion plate, fixation screws and metal intervertebral spacers/cages. The orthopedic hardware appears well seated with no evidence of loosening. Soft tissues and spinal canal: No prevertebral fluid or swelling. No visible canal hematoma. Disc levels: There is moderate loss of disc height with endplate spurring at the C6-C7 level. Endplate spurring along the mid to lower cervical spine these 2 at least mild degrees of central stenosis, which appears chronic. No convincing disc herniation. Neural foramina are well preserved. Upper chest: No acute findings. Thyroid is enlarged with multiple small nodules. Other: None. IMPRESSION: HEAD CT 1. Small, 4 mm, focus of parenchymal hemorrhage in the superior medial left frontal lobe subcortical white matter. 2. No other acute intracranial abnormality. 3. No skull fracture. MAXILLOFACIAL CT 1. Nondisplaced left orbital floor fracture associated with the left maxillary sinus hemorrhage. No herniation of the orbital contents or entrapment of the extra-ocular musculature. No intraorbital hematoma or inflammation. 2. No other fractures. 3. Left supraorbital forehead laceration and mild left preseptal periorbital soft tissue  swelling. CERVICAL CT 1. No fracture or acute finding. 2. No evidence of loosening of the cervical spine fusion hardware. Electronically Signed   By: Lajean Manes M.D.   On: 08/06/2016 17:20   Ct Cervical Spine Wo Contrast  Result Date: 08/06/2016 CLINICAL DATA:  Patient had a witnessed fall, + LOC, and laceration over left orbit. Patient complains of head, neck and jaw pain. She does not remember what happened EXAM: CT HEAD WITHOUT CONTRAST CT MAXILLOFACIAL WITHOUT CONTRAST CT CERVICAL SPINE WITHOUT CONTRAST TECHNIQUE: Multidetector CT imaging of the head, cervical spine, and maxillofacial structures were performed using the standard protocol without intravenous contrast. Multiplanar CT image reconstructions of the cervical spine and maxillofacial structures were also generated. COMPARISON:  None. FINDINGS: CT HEAD FINDINGS Brain: There is a small focus of hyperattenuation within the sub cortical white matter of the superior medial left frontal lobe, measuring 4 mm in size, consistent with a small parenchymal contusion. No other evidence of intracranial hemorrhage. Ventricles are normal in configuration. There is ventricular sulcal enlargement reflecting age related volume loss. No hydrocephalus. There are no parenchymal masses or mass effect. There is no evidence of an infarct. Mild patchy white matter hypoattenuation is noted consistent with chronic microvascular ischemic change. There are no extra-axial masses or abnormal fluid collections. Vascular: No hyperdense vessel or unexpected calcification. Skull: Normal. Negative for fracture or focal lesion. Other: None. CT MAXILLOFACIAL FINDINGS Osseous: There is a fracture of the mid to posterior aspect of the left orbital floor with no significant depression or displacement. Small amount of extraconal intraorbital air lies adjacent to the fracture. Cell no other fractures. Orbits: Mild preseptal left periorbital soft tissue swelling/ hemorrhage. Small  laceration to the left supraorbital forehead. No radiopaque foreign body. Unremarkable globes other than changes from cataract surgery. The postseptal orbits are unremarkable other than the small amount of extraconal air noted on the left associated with the orbital floor fracture. Sinuses: Left maxillary sinuses partly filled with dependent hemorrhage. Remaining visualized sinuses are clear. Clear mastoid air cells and middle ear cavities. Soft tissues: No other soft tissue abnormality. No soft tissue masses or adenopathy.  CT CERVICAL SPINE FINDINGS Alignment: Mild reversal the normal cervical lordosis. No spondylolisthesis. Skull base and vertebrae: No fracture. No osteoblastic or osteolytic lesions. Status post anterior cervical spine fusion from C3 through C6 with an anterior fusion plate, fixation screws and metal intervertebral spacers/cages. The orthopedic hardware appears well seated with no evidence of loosening. Soft tissues and spinal canal: No prevertebral fluid or swelling. No visible canal hematoma. Disc levels: There is moderate loss of disc height with endplate spurring at the C6-C7 level. Endplate spurring along the mid to lower cervical spine these 2 at least mild degrees of central stenosis, which appears chronic. No convincing disc herniation. Neural foramina are well preserved. Upper chest: No acute findings. Thyroid is enlarged with multiple small nodules. Other: None. IMPRESSION: HEAD CT 1. Small, 4 mm, focus of parenchymal hemorrhage in the superior medial left frontal lobe subcortical white matter. 2. No other acute intracranial abnormality. 3. No skull fracture. MAXILLOFACIAL CT 1. Nondisplaced left orbital floor fracture associated with the left maxillary sinus hemorrhage. No herniation of the orbital contents or entrapment of the extra-ocular musculature. No intraorbital hematoma or inflammation. 2. No other fractures. 3. Left supraorbital forehead laceration and mild left preseptal  periorbital soft tissue swelling. CERVICAL CT 1. No fracture or acute finding. 2. No evidence of loosening of the cervical spine fusion hardware. Electronically Signed   By: Lajean Manes M.D.   On: 08/06/2016 17:20   Nm Cardiac Muga Rest  Result Date: 08/03/2016 CLINICAL DATA:  Left breast cancer. Pre chemotherapy cardiac assessment. EXAM: NUCLEAR MEDICINE CARDIAC BLOOD POOL IMAGING (MUGA) TECHNIQUE: Cardiac multi-gated acquisition was performed at rest following intravenous injection of Tc-23m labeled red blood cells. RADIOPHARMACEUTICALS:  21.7 mCi Tc-9m MDP in-vitro labeled red blood cells IV COMPARISON:  None. FINDINGS: Normal left ventricular wall motion. No akinetic or dyskinetic segments are identified. The ejection fraction was calculated at 68.9%. IMPRESSION: Normal left ventricular wall motion with ejection fraction of 68.9%. Electronically Signed   By: Marijo Sanes M.D.   On: 08/03/2016 12:17   Dg Chest Port 1 View  Result Date: 08/05/2016 CLINICAL DATA:  Port-A-Cath placement. EXAM: PORTABLE CHEST 1 VIEW COMPARISON:  10/16/2013 chest radiograph FINDINGS: Upper limits normal heart size again noted. A right subclavian central venous catheter is present with tip overlying the superior cavoatrial junction. There is no evidence of focal airspace disease, pulmonary edema, suspicious pulmonary nodule/mass, pleural effusion, or pneumothorax. No acute bony abnormalities are identified. IMPRESSION: Right subclavian central venous catheter with tip overlying the superior cavoatrial junction. No evidence of pneumothorax. Electronically Signed   By: Margarette Canada M.D.   On: 08/05/2016 11:33   Dg C-arm 1-60 Min-no Report  Result Date: 08/05/2016 Fluoroscopy was utilized by the requesting physician.  No radiographic interpretation.   Ct Maxillofacial Wo Contrast  Result Date: 08/06/2016 CLINICAL DATA:  Patient had a witnessed fall, + LOC, and laceration over left orbit. Patient complains of head, neck  and jaw pain. She does not remember what happened EXAM: CT HEAD WITHOUT CONTRAST CT MAXILLOFACIAL WITHOUT CONTRAST CT CERVICAL SPINE WITHOUT CONTRAST TECHNIQUE: Multidetector CT imaging of the head, cervical spine, and maxillofacial structures were performed using the standard protocol without intravenous contrast. Multiplanar CT image reconstructions of the cervical spine and maxillofacial structures were also generated. COMPARISON:  None. FINDINGS: CT HEAD FINDINGS Brain: There is a small focus of hyperattenuation within the sub cortical white matter of the superior medial left frontal lobe, measuring 4 mm in size, consistent with a small  parenchymal contusion. No other evidence of intracranial hemorrhage. Ventricles are normal in configuration. There is ventricular sulcal enlargement reflecting age related volume loss. No hydrocephalus. There are no parenchymal masses or mass effect. There is no evidence of an infarct. Mild patchy white matter hypoattenuation is noted consistent with chronic microvascular ischemic change. There are no extra-axial masses or abnormal fluid collections. Vascular: No hyperdense vessel or unexpected calcification. Skull: Normal. Negative for fracture or focal lesion. Other: None. CT MAXILLOFACIAL FINDINGS Osseous: There is a fracture of the mid to posterior aspect of the left orbital floor with no significant depression or displacement. Small amount of extraconal intraorbital air lies adjacent to the fracture. Cell no other fractures. Orbits: Mild preseptal left periorbital soft tissue swelling/ hemorrhage. Small laceration to the left supraorbital forehead. No radiopaque foreign body. Unremarkable globes other than changes from cataract surgery. The postseptal orbits are unremarkable other than the small amount of extraconal air noted on the left associated with the orbital floor fracture. Sinuses: Left maxillary sinuses partly filled with dependent hemorrhage. Remaining visualized  sinuses are clear. Clear mastoid air cells and middle ear cavities. Soft tissues: No other soft tissue abnormality. No soft tissue masses or adenopathy. CT CERVICAL SPINE FINDINGS Alignment: Mild reversal the normal cervical lordosis. No spondylolisthesis. Skull base and vertebrae: No fracture. No osteoblastic or osteolytic lesions. Status post anterior cervical spine fusion from C3 through C6 with an anterior fusion plate, fixation screws and metal intervertebral spacers/cages. The orthopedic hardware appears well seated with no evidence of loosening. Soft tissues and spinal canal: No prevertebral fluid or swelling. No visible canal hematoma. Disc levels: There is moderate loss of disc height with endplate spurring at the C6-C7 level. Endplate spurring along the mid to lower cervical spine these 2 at least mild degrees of central stenosis, which appears chronic. No convincing disc herniation. Neural foramina are well preserved. Upper chest: No acute findings. Thyroid is enlarged with multiple small nodules. Other: None. IMPRESSION: HEAD CT 1. Small, 4 mm, focus of parenchymal hemorrhage in the superior medial left frontal lobe subcortical white matter. 2. No other acute intracranial abnormality. 3. No skull fracture. MAXILLOFACIAL CT 1. Nondisplaced left orbital floor fracture associated with the left maxillary sinus hemorrhage. No herniation of the orbital contents or entrapment of the extra-ocular musculature. No intraorbital hematoma or inflammation. 2. No other fractures. 3. Left supraorbital forehead laceration and mild left preseptal periorbital soft tissue swelling. CERVICAL CT 1. No fracture or acute finding. 2. No evidence of loosening of the cervical spine fusion hardware. Electronically Signed   By: Lajean Manes M.D.   On: 08/06/2016 17:20     Assessment and plan- Patient is a 82 y.o. female with Stage IB T2N0M0 ER PR positive her 2 neu negative left breast cancer undergoing neoadjuvant chemotherapy  with AC-T  Counts ok to proceed with cycle 2 of dose dense AC today.  Chemo induced anemia- continue to monitor  rtc in 2 weeks prior to cycle 3 of dose dense AC with labs    Visit Diagnosis 1. Primary cancer of upper outer quadrant of left female breast ([place])   2. Encounter for antineoplastic chemotherapy      Dr. Randa Evens, MD, MPH [facility] at [facility] Pager- [phone] 08/31/2016 7:27 PM

## 2016-09-12 ENCOUNTER — Encounter: Payer: Self-pay | Admitting: Family Medicine

## 2016-09-12 ENCOUNTER — Ambulatory Visit (INDEPENDENT_AMBULATORY_CARE_PROVIDER_SITE_OTHER): Payer: PPO | Admitting: Family Medicine

## 2016-09-12 VITALS — BP 142/58 | HR 98 | Temp 97.7°F | Resp 16 | Wt 169.0 lb

## 2016-09-12 DIAGNOSIS — I629 Nontraumatic intracranial hemorrhage, unspecified: Secondary | ICD-10-CM

## 2016-09-12 DIAGNOSIS — R55 Syncope and collapse: Secondary | ICD-10-CM | POA: Diagnosis not present

## 2016-09-12 DIAGNOSIS — I10 Essential (primary) hypertension: Secondary | ICD-10-CM

## 2016-09-12 DIAGNOSIS — S0232XD Fracture of orbital floor, left side, subsequent encounter for fracture with routine healing: Secondary | ICD-10-CM

## 2016-09-12 MED ORDER — TRAMADOL HCL 50 MG PO TABS: 50.0000 mg | ORAL_TABLET | Freq: Two times a day (BID) | ORAL | 5 refills | Status: DC | PRN

## 2016-09-12 NOTE — Progress Notes (Signed)
Subjective:  HPI Pt is here for a 1 month follow up for blood pressure. Her BP was low at last OV at 122/50 and questionable as to this being the cause of her syncopal episode. Changed from Hyzaar to Losartan 100 mg daily. Pt reports that she is feeling fairly well other than headache and feeling tired.   She reports that she is concerned about her headache from the fall. Her had is still healing. Her lip is healing but says when she eat it spilts back open. She is not able to eat very much, in fact is mainly drinking boost. She says she has a constant headache that will not go away. She was given tramadol when she was discharged from the hospital but she is out of them and she says this helped with the headache some. She also would like Dr. Rosanna Randy to go over her medications and see if there is anything that could interact with the Chemo therapy. She also has a lot of watery eyes and nose. She is taking Claritin daily. She was told that Chemo therapy could make this worse and to has gotten worse since starting treatments.   Prior to Admission medications   Medication Sig Start Date End Date Taking? Authorizing Provider  aspirin 81 MG tablet Take 81 mg by mouth daily.  11/13/13   [provider]  clindamycin (CLEOCIN) 300 MG capsule Take 1 capsule (300 mg total) by mouth 4 (four) times daily. X 7 days Patient not taking: Reported on 08/30/2016 08/06/16   Julianne Rice, MD  clobetasol cream (TEMOVATE) 2.29 % Apply 1 application topically 2 (two) times daily. ON ARMS    [provider]  esomeprazole (NEXIUM) 20 MG capsule Take 1 capsule (20 mg total) by mouth daily. 07/07/16   Jerrol Banana., MD  furosemide (LASIX) 20 MG tablet TAKE 1 TABLET (20 MG TOTAL) BY MOUTH 2 (TWO) TIMES DAILY. Patient taking differently: Take 20 mg by mouth 2 (two) times daily. Takes 1 daily and and 2nd dose only if needed for fluid retention 04/14/16   Jerrol Banana., MD  hydrocortisone 2.5  % cream Apply 1 application topically 2 (two) times daily. ON FACE    [provider]  lidocaine-prilocaine (EMLA) cream Apply to affected area once 07/28/16   Lloyd Huger, MD  loratadine (CLARITIN) 10 MG tablet Take 10 mg by mouth daily as needed.     [provider]  losartan (COZAAR) 100 MG tablet Take 1 tablet (100 mg total) by mouth daily. Patient not taking: Reported on 08/16/2016 08/09/16   Jerrol Banana., MD  losartan-hydrochlorothiazide [facility]) 100-12.5 MG tablet  07/08/16   [provider]  Misc Natural Products (OSTEO BI-FLEX JOINT SHIELD PO) Take 2 tablets by mouth daily.    [provider]  Multiple Vitamin (MULTIVITAMIN WITH MINERALS) TABS tablet Take 1 tablet by mouth 3 (three) times a week.    [provider]  ondansetron (ZOFRAN) 8 MG tablet Take 1 tablet (8 mg total) by mouth 2 (two) times daily as needed. Start on the third day after chemotherapy. Patient not taking: Reported on 08/30/2016 07/28/16   Lloyd Huger, MD  prochlorperazine (COMPAZINE) 10 MG tablet Take 1 tablet (10 mg total) by mouth every 6 (six) hours as needed (Nausea or vomiting). Patient not taking: Reported on 08/30/2016 07/28/16   Lloyd Huger, MD  rosuvastatin (CRESTOR) 5 MG tablet Take 1 tablet (5 mg total) by  mouth every other day. 04/18/16   Jerrol Banana., MD  sucralfate (CARAFATE) 1 g tablet Take 1 tablet (1 g total) by mouth 4 (four) times daily -  with meals and at bedtime. Patient not taking: Reported on 08/09/2016 04/12/16   Jerrol Banana., MD  traMADol [facility]) 50 MG tablet Take 1 tablet (50 mg total) by mouth every 6 (six) hours as needed. Patient not taking: Reported on 08/30/2016 08/06/16   Julianne Rice, MD    Patient Active Problem List   Diagnosis Date Noted  . Primary cancer of upper outer quadrant of left female breast ([place]) 07/28/2016  . Left breast mass 07/25/2016  . Abnormal finding on mammography  07/25/2016  . Hyperglycemia 04/12/2016  . Absolute anemia 07/25/2014  . Anxiety 07/25/2014  . AB (asthmatic bronchitis) 07/25/2014  . 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 Intervertebral cervical disc disorder with myelopathy, cervical region 07/25/2014  . Chest pain 07/25/2014  . Breath shortness 07/25/2014  . Edema leg 07/25/2014  . Fatigue 07/25/2014  . Acid reflux 07/25/2014  . 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 Arthralgia of hip 07/25/2014  . HLD (hyperlipidemia) 07/25/2014  . Below normal amount of sodium in the blood 07/25/2014  . Allergic state 07/25/2014  . Neuropathy 07/25/2014  . Arthritis, degenerative 07/25/2014  . Peptic ulcer 07/25/2014  . FOOT, PAIN 10/12/2007  . Hyperlipidemia 09/18/2007  . Hypertension 09/18/2007  . PEPTIC ULCER DISEASE 09/18/2007  . PLANTAR FASCIITIS, LEFT 09/18/2007    Past Medical History:  Diagnosis Date  . Anemia   . Anxiety   . Arthritis   . Blood transfusion without reported diagnosis   . Cataract   . Complication of anesthesia    HARD TO WAKE UP AFTER TONSILLECTOMY  . GERD (gastroesophageal reflux disease)   . Hyperlipidemia   . Hypertension   . Neuromuscular disorder ([place])   . Primary cancer of upper outer quadrant of left female breast ([place]) 07/28/2016    Social History   Social History  . Marital status: Widowed    Spouse name: N/A  . Number of children: N/A  . Years of education: N/A   Occupational History  . Not on file.   Social History Main Topics  . Smoking status: Never Smoker  . Smokeless tobacco: Never Used  . Alcohol use No     Comment: Maybe 3 per year  . Drug use: No  . Sexual activity: No   Other Topics Concern  . Not on file   Social History Narrative  . No narrative on file    Allergies  Allergen Reactions  . Keflex  [Cephalexin]     hives  . Statins     Muscle and joint pain-patient states she tried all statins.  . Erythromycin Rash    ALL MYCINS    Review of Systems    Constitutional: Positive for malaise/fatigue.  HENT: Negative.        Watery eyes   Eyes: Negative.   Respiratory: Negative.   Cardiovascular: Negative.   Gastrointestinal: Negative.   Genitourinary: Negative.   Musculoskeletal: Negative.   Skin: Negative.   Neurological: Positive for headaches.  Endo/Heme/Allergies: Negative.   Psychiatric/Behavioral: Negative.     Immunization History  Administered Date(s) Administered  . Influenza, High Dose Seasonal PF 12/23/2014, 01/14/2016  . Pneumococcal Conjugate-13 02/25/2014  . Pneumococcal Polysaccharide-23 04/12/2016  . Td 06/17/2004  . Tdap 08/06/2016  . Zoster 04/14/2011    Objective:  BP (!) 142/58 (BP Location: Left  Arm, Patient Position: Sitting, Cuff Size: Normal)   Pulse 98   Temp 97.7 F (36.5 C) (Oral)   Resp 16   Wt 169 lb (76.7 kg)   SpO2 98%   BMI 24.96 kg/m   Physical Exam  Constitutional: She is oriented to person, place, and time and well-developed, well-nourished, and in no distress.  HENT:  Head: Normocephalic and atraumatic.  Right Ear: External ear normal.  Left Ear: External ear normal.  Nose: Nose normal.  She has resolving contusion and swelling in the left side of her face and head. There is a scar from suturing above her left lateral eyebrow.  Eyes: Conjunctivae are normal. No scleral icterus.  Neck: No thyromegaly present.  Cardiovascular: Normal rate, regular rhythm and normal heart sounds.   Pulmonary/Chest: Effort normal and breath sounds normal.  Abdominal: Soft.  Musculoskeletal: She exhibits no deformity.  Neurological: She is alert and oriented to person, place, and time. Gait normal. GCS score is 15.  Decreased ability to raise her eyebrow on the left probably due to trauma from her fall.  Skin: Skin is warm.  Psychiatric: Mood, memory, affect and judgment normal.    Lab Results  Component Value Date   WBC 11.4 (H) 08/30/2016   HGB 9.7 (L) 08/30/2016   HCT 28.9 (L) 08/30/2016    PLT 243 08/30/2016   GLUCOSE 104 (H) 08/30/2016   CHOL 294 (H) 04/12/2016   TRIG 93 04/12/2016   HDL 69 04/12/2016   LDLCALC 206 (H) 04/12/2016   TSH 0.719 04/12/2016   INR 0.96 08/06/2016   HGBA1C 6.1 (H) 04/12/2016    CMP     Component Value Date/Time   NA 140 08/30/2016 1340   NA 139 07/25/2016 1225   NA 136 10/29/2013 0645   K 3.8 08/30/2016 1340   K 3.7 10/29/2013 0645   CL 103 08/30/2016 1340   CL 99 10/29/2013 0645   CO2 29 08/30/2016 1340   CO2 32 10/29/2013 0645   GLUCOSE 104 (H) 08/30/2016 1340   GLUCOSE 97 10/29/2013 0645   BUN 13 08/30/2016 1340   BUN 20 07/25/2016 1225   BUN 14 10/29/2013 0645   CREATININE 1.11 (H) 08/30/2016 1340   CREATININE 1.41 (H) 10/29/2013 0645   CALCIUM 9.5 08/30/2016 1340   CALCIUM 10.0 10/29/2013 0645   PROT 6.7 08/30/2016 1340   PROT 7.2 07/25/2016 1225   PROT 6.4 10/11/2013 1355   ALBUMIN 3.6 08/30/2016 1340   ALBUMIN 4.6 07/25/2016 1225   ALBUMIN 2.4 (L) 10/11/2013 1355   AST 23 08/30/2016 1340   AST 43 (H) 10/11/2013 1355   ALT 21 08/30/2016 1340   ALT 33 10/11/2013 1355   ALKPHOS 70 08/30/2016 1340   ALKPHOS 85 10/11/2013 1355   BILITOT 0.3 08/30/2016 1340   BILITOT 0.3 07/25/2016 1225   BILITOT 0.6 10/11/2013 1355   GFRNONAA 46 (L) 08/30/2016 1340   GFRNONAA 36 (L) 10/29/2013 0645   GFRAA 53 (L) 08/30/2016 1340   GFRAA 42 (L) 10/29/2013 0645    Assessment and Plan :  1. Essential hypertension   2. Closed fracture of left orbital floor with routine healing, subsequent encounter  - traMADol (ULTRAM) 50 MG tablet; Take 1 tablet (50 mg total) by mouth 2 (two) times daily as needed.  Dispense: 60 tablet; Refill: 5  3. Syncope, unspecified syncope type ECG WNL. Vasovagal most likely but would proceed with evaluation.Certainly dysrhythmia is a possibility. - EKG 12-Lead - Ambulatory referral to Cardiology - Ambulatory  referral to Neurology  4. Intracranial hemorrhage ([place]) due to TBI   - Ambulatory referral  to Neurology 5.TBI  Miguel Aschoff MD [facility] 09/12/2016 11:43 AM

## 2016-09-12 NOTE — Progress Notes (Signed)
[place]  [phone] [phone]  ID: Sandra Fritz OB: 21-Aug-1934  MR#: [id number]  TOI#:[id number]  Patient Care Team: Jerrol Banana., MD as PCP - General (Unknown Physician Specialty) Jerrol Banana., MD (Family Medicine) Bary Castilla Forest Gleason, MD (General Surgery)  CHIEF COMPLAINT: Clinical stage Ib ER/PR positive, HER-2 negative invasive carcinoma of the upper outer quadrant of the left breast.  INTERVAL HISTORY: Patient returns to clinic today for further evaluation and consideration of cycle 3 of 4 of Adriamycin and Cytoxan. She has no further falls or episodes of syncope and is recovered from her previous facial trauma. She has occasional headaches on the left side of her head and takes tramadol for relief. She denies any recent fevers or illnesses. She has had problems chewing on the left side due to her injury to her face. She has no chest pain or shortness of breath. She denies any nausea, vomiting, constipation, or diarrhea. She states she occasionally has a burning sensation while urinating and some itching. No other urinary complaints. Patient otherwise feels well and offers no further specific complaints.  REVIEW OF SYSTEMS:   Review of Systems  Constitutional: Negative.  Negative for fever, malaise/fatigue and weight loss.  HENT: Negative.   Eyes: Positive for pain. Negative for blurred vision and double vision.  Respiratory: Negative.  Negative for cough and shortness of breath.   Cardiovascular: Negative.  Negative for chest pain and leg swelling.  Gastrointestinal: Negative.  Negative for abdominal pain.  Genitourinary: Positive for dysuria, frequency and urgency.  Musculoskeletal: Negative.   Skin: Negative.  Negative for rash.  Neurological: Positive for headaches. Negative for sensory change and weakness.  Endo/Heme/Allergies: Negative.   Psychiatric/Behavioral: The patient is nervous/anxious.     As per  HPI. Otherwise, a complete review of systems is negative.  PAST MEDICAL HISTORY: Past Medical History:  Diagnosis Date  . Anemia   . Anxiety   . Arthritis   . Blood transfusion without reported diagnosis   . Cataract   . Complication of anesthesia    HARD TO WAKE UP AFTER TONSILLECTOMY  . GERD (gastroesophageal reflux disease)   . Hyperlipidemia   . Hypertension   . Neuromuscular disorder ([place])   . Primary cancer of upper outer quadrant of left female breast ([place]) 07/28/2016    PAST SURGICAL HISTORY: Past Surgical History:  Procedure Laterality Date  . APPENDECTOMY  1955   SECONDARY TO RUTURED APPENDIX  . CATARACT EXTRACTION, BILATERAL  2011  . COLONOSCOPY  2002  . DILATION AND CURETTAGE OF UTERUS  1989   Dr. Laurey Morale  . JOINT REPLACEMENT     Right hip  . MOLE REMOVAL  1957   18 removed by Dr. Hoyle Sauer  . NECK SURGERY    . PORTACATH PLACEMENT Right 08/05/2016   Procedure: INSERTION PORT-A-CATH WITH ULTRASOUND;  Surgeon: Robert Bellow, MD;  Location: [facility] ORS;  Service: General;  Laterality: Right;  . TONSILLECTOMY  1955    FAMILY HISTORY: Family History  Problem Relation Age of Onset  . Macular degeneration Mother   . Hypertension Mother   . Diabetes Father   . Heart disease Father   . Heart attack Father 31  . Heart disease Brother 13  . Dermatomyositis Sister   . Cancer Sister        Carcinoma of the uterine call attached to colon and is in remission now  . Breast cancer Maternal Aunt 70  . Breast cancer Paternal Aunt  28  . Breast cancer Cousin        paternal aunt w/ breast ca daughter  . Breast cancer Maternal Aunt 60    ADVANCED DIRECTIVES (Y/N):  N  HEALTH MAINTENANCE: Social History  Substance Use Topics  . Smoking status: Never Smoker  . Smokeless tobacco: Never Used  . Alcohol use No     Comment: Maybe 3 per year     Colonoscopy:  PAP:  Bone density:  Lipid panel:  Allergies  Allergen Reactions  . Keflex  [Cephalexin]     hives    . Statins     Muscle and joint pain-patient states she tried all statins.  . Erythromycin Rash    ALL MYCINS    Current Outpatient Prescriptions  Medication Sig Dispense Refill  . esomeprazole (NEXIUM) 20 MG capsule Take 1 capsule (20 mg total) by mouth daily. 90 capsule 3  . furosemide (LASIX) 20 MG tablet TAKE 1 TABLET (20 MG TOTAL) BY MOUTH 2 (TWO) TIMES DAILY. (Patient taking differently: Take 20 mg by mouth 2 (two) times daily. Takes 1 daily and and 2nd dose only if needed for fluid retention) 180 tablet 3  . lidocaine-prilocaine (EMLA) cream Apply to affected area once 30 g 3  . loratadine (CLARITIN) 10 MG tablet Take 10 mg by mouth daily as needed.     Marland Kitchen losartan (COZAAR) 100 MG tablet Take 1 tablet (100 mg total) by mouth daily. 90 tablet 3  . losartan-hydrochlorothiazide (HYZAAR) 100-12.5 MG tablet     . Misc Natural Products (OSTEO BI-FLEX JOINT SHIELD PO) Take 2 tablets by mouth daily.    . Multiple Vitamin (MULTIVITAMIN WITH MINERALS) TABS tablet Take 1 tablet by mouth 3 (three) times a week.    . ondansetron (ZOFRAN) 8 MG tablet Take 1 tablet (8 mg total) by mouth 2 (two) times daily as needed. Start on the third day after chemotherapy. 60 tablet 2  . prochlorperazine (COMPAZINE) 10 MG tablet Take 1 tablet (10 mg total) by mouth every 6 (six) hours as needed (Nausea or vomiting). 60 tablet 2  . rosuvastatin (CRESTOR) 5 MG tablet Take 1 tablet (5 mg total) by mouth every other day. 45 tablet 3  . traMADol (ULTRAM) 50 MG tablet Take 1 tablet (50 mg total) by mouth 2 (two) times daily as needed. 60 tablet 5  . aspirin 81 MG tablet Take 81 mg by mouth daily.     . clobetasol cream (TEMOVATE) 0.34 % Apply 1 application topically 2 (two) times daily. ON ARMS    . hydrocortisone 2.5 % cream Apply 1 application topically 2 (two) times daily. ON FACE    . sucralfate (CARAFATE) 1 g tablet Take 1 tablet (1 g total) by mouth 4 (four) times daily -  with meals and at bedtime. (Patient not  taking: Reported on 08/09/2016) 120 tablet 12   No current facility-administered medications for this visit.    Facility-Administered Medications Ordered in Other Visits  Medication Dose Route Frequency Provider Last Rate Last Dose  . heparin lock flush 100 unit/mL  500 Units Intravenous Once Lloyd Huger, MD        OBJECTIVE: Vitals:   09/13/16 0930  BP: (!) 147/73  Pulse: 81  Resp: 20  Temp: (!) 96.1 F (35.6 C)     Body mass index is 24.79 kg/m.    ECOG FS:0 - Asymptomatic  General: Well-developed, well-nourished, no acute distress. Eyes: Pink conjunctiva, anicteric sclera.  Breasts: Palpable left breast mass.  HEENT: Multiple ecchymosis on face. improved Lungs: Clear to auscultation bilaterally. Heart: Regular rate and rhythm. No rubs, murmurs, or gallops. Abdomen: Soft, nontender, nondistended. No organomegaly noted, normoactive bowel sounds. Musculoskeletal: No edema, cyanosis, or clubbing. Neuro: Alert, answering all questions appropriately. Cranial nerves grossly intact. Skin: No rashes or petechiae noted. Psych: Normal affect.   LAB RESULTS:  Lab Results  Component Value Date   NA 139 09/13/2016   K 3.7 09/13/2016   CL 102 09/13/2016   CO2 28 09/13/2016   GLUCOSE 113 (H) 09/13/2016   BUN 14 09/13/2016   CREATININE 1.26 (H) 09/13/2016   CALCIUM 9.4 09/13/2016   PROT 6.8 09/13/2016   ALBUMIN 3.8 09/13/2016   AST 31 09/13/2016   ALT 22 09/13/2016   ALKPHOS 76 09/13/2016   BILITOT 0.3 09/13/2016   GFRNONAA 39 (L) 09/13/2016   GFRAA 46 (L) 09/13/2016    Lab Results  Component Value Date   WBC 10.2 09/13/2016   NEUTROABS 8.5 (H) 09/13/2016   HGB 9.2 (L) 09/13/2016   HCT 26.8 (L) 09/13/2016   MCV 83.2 09/13/2016   PLT 282 09/13/2016     STUDIES: No results found.  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  PLAN:    1. Clinical stage Ib ER/PR positive,  HER-2 negative invasive carcinoma of the upper outer quadrant of the left breast: Given the size of patient's breast mass at 2.7 cm, have recommended neoadjuvant chemotherapy using Adriamycin, Cytoxan, and Taxol. She will also require Neulasta support. Pretreatment MUGA scan revealed an EF of 69%. Once she completes neoadjuvant chemotherapy, will refer back to surgery for possible lumpectomy. Patient will also require adjuvant XRT. Finally, given the ER/PR status of her tumor she will benefit from an aromatase inhibitor for 5 years. Proceed with cycle 3 of 4 of Adriamycin and Cytoxan today. Return to clinic in 2 weeks for consideration of cycle 4.  2. Syncopal episode: Unclear etiology, patient was evaluated in the emergency room on day of her injuries. She has not fallen since.  3. Subdural hematoma: Secondary to fall. Monitor. 4. Anemia: Trending down. Monitor.  5. Renal insufficiency: Mild, monitor. 6. Anxiety: Rx for Xanax nightly.  7. Dysuria: Will get UA and culture today.   Patient expressed understanding and was in agreement with this plan. She also understands that She can call clinic at any time with any questions, concerns, or complaints.   Cancer Staging Primary cancer of upper outer quadrant of left female breast [facility]) Staging form: Breast, AJCC 8th Edition - Clinical stage from 07/28/2016: Stage IB (cT2, cN0, cM0, G2, ER: Positive, PR: Positive, HER2: Negative) - Signed by Lloyd Huger, MD on 07/28/2016  Rulon Abide, NP  Patient was seen and evaluated independently and I agree with the assessment and plan as dictated above.  U/ A and culture negative.  Lloyd Huger, MD 09/16/16 3:34 PM

## 2016-09-13 ENCOUNTER — Inpatient Hospital Stay (HOSPITAL_BASED_OUTPATIENT_CLINIC_OR_DEPARTMENT_OTHER): Payer: PPO | Admitting: Oncology

## 2016-09-13 ENCOUNTER — Inpatient Hospital Stay: Payer: PPO

## 2016-09-13 VITALS — BP 147/73 | HR 81 | Temp 96.1°F | Resp 20 | Wt 167.9 lb

## 2016-09-13 DIAGNOSIS — S0181XA Laceration without foreign body of other part of head, initial encounter: Secondary | ICD-10-CM

## 2016-09-13 DIAGNOSIS — Z5111 Encounter for antineoplastic chemotherapy: Secondary | ICD-10-CM | POA: Diagnosis not present

## 2016-09-13 DIAGNOSIS — E785 Hyperlipidemia, unspecified: Secondary | ICD-10-CM

## 2016-09-13 DIAGNOSIS — I10 Essential (primary) hypertension: Secondary | ICD-10-CM

## 2016-09-13 DIAGNOSIS — Z7689 Persons encountering health services in other specified circumstances: Secondary | ICD-10-CM | POA: Diagnosis not present

## 2016-09-13 DIAGNOSIS — K219 Gastro-esophageal reflux disease without esophagitis: Secondary | ICD-10-CM | POA: Diagnosis not present

## 2016-09-13 DIAGNOSIS — R300 Dysuria: Secondary | ICD-10-CM | POA: Diagnosis not present

## 2016-09-13 DIAGNOSIS — Z79899 Other long term (current) drug therapy: Secondary | ICD-10-CM

## 2016-09-13 DIAGNOSIS — D6481 Anemia due to antineoplastic chemotherapy: Secondary | ICD-10-CM | POA: Diagnosis not present

## 2016-09-13 DIAGNOSIS — M4802 Spinal stenosis, cervical region: Secondary | ICD-10-CM | POA: Diagnosis not present

## 2016-09-13 DIAGNOSIS — M129 Arthropathy, unspecified: Secondary | ICD-10-CM

## 2016-09-13 DIAGNOSIS — C50412 Malignant neoplasm of upper-outer quadrant of left female breast: Secondary | ICD-10-CM

## 2016-09-13 DIAGNOSIS — F419 Anxiety disorder, unspecified: Secondary | ICD-10-CM | POA: Diagnosis not present

## 2016-09-13 DIAGNOSIS — N289 Disorder of kidney and ureter, unspecified: Secondary | ICD-10-CM | POA: Diagnosis not present

## 2016-09-13 DIAGNOSIS — Z170 Estrogen receptor positive status [ER+]: Secondary | ICD-10-CM | POA: Diagnosis not present

## 2016-09-13 DIAGNOSIS — Z7982 Long term (current) use of aspirin: Secondary | ICD-10-CM

## 2016-09-13 DIAGNOSIS — Z803 Family history of malignant neoplasm of breast: Secondary | ICD-10-CM

## 2016-09-13 LAB — CBC WITH DIFFERENTIAL/PLATELET
BASOS ABS: 0 10*3/uL (ref 0–0.1)
BASOS PCT: 0 %
EOS PCT: 0 %
Eosinophils Absolute: 0 10*3/uL (ref 0–0.7)
HEMATOCRIT: 26.8 % — AB (ref 35.0–47.0)
Hemoglobin: 9.2 g/dL — ABNORMAL LOW (ref 12.0–16.0)
LYMPHS PCT: 9 %
Lymphs Abs: 0.9 10*3/uL — ABNORMAL LOW (ref 1.0–3.6)
MCH: 28.6 pg (ref 26.0–34.0)
MCHC: 34.4 g/dL (ref 32.0–36.0)
MCV: 83.2 fL (ref 80.0–100.0)
MONO ABS: 0.7 10*3/uL (ref 0.2–0.9)
Monocytes Relative: 7 %
NEUTROS ABS: 8.5 10*3/uL — AB (ref 1.4–6.5)
Neutrophils Relative %: 84 %
PLATELETS: 282 10*3/uL (ref 150–440)
RBC: 3.22 MIL/uL — AB (ref 3.80–5.20)
RDW: 14.9 % — AB (ref 11.5–14.5)
WBC: 10.2 10*3/uL (ref 3.6–11.0)

## 2016-09-13 LAB — COMPREHENSIVE METABOLIC PANEL
ALBUMIN: 3.8 g/dL (ref 3.5–5.0)
ALT: 22 U/L (ref 14–54)
AST: 31 U/L (ref 15–41)
Alkaline Phosphatase: 76 U/L (ref 38–126)
Anion gap: 9 (ref 5–15)
BUN: 14 mg/dL (ref 6–20)
CHLORIDE: 102 mmol/L (ref 101–111)
CO2: 28 mmol/L (ref 22–32)
CREATININE: 1.26 mg/dL — AB (ref 0.44–1.00)
Calcium: 9.4 mg/dL (ref 8.9–10.3)
GFR calc Af Amer: 46 mL/min — ABNORMAL LOW (ref >60)
GFR calc non Af Amer: 39 mL/min — ABNORMAL LOW (ref >60)
GLUCOSE: 113 mg/dL — AB (ref 65–99)
POTASSIUM: 3.7 mmol/L (ref 3.5–5.1)
Sodium: 139 mmol/L (ref 135–145)
Total Bilirubin: 0.3 mg/dL (ref 0.3–1.2)
Total Protein: 6.8 g/dL (ref 6.5–8.1)

## 2016-09-13 LAB — URINALYSIS, COMPLETE (UACMP) WITH MICROSCOPIC
Bacteria, UA: NONE SEEN
Bilirubin Urine: NEGATIVE
Glucose, UA: NEGATIVE mg/dL
Hgb urine dipstick: NEGATIVE
KETONES UR: NEGATIVE mg/dL
Leukocytes, UA: NEGATIVE
NITRITE: NEGATIVE
PH: 7 (ref 5.0–8.0)
Protein, ur: NEGATIVE mg/dL
SPECIFIC GRAVITY, URINE: 1.015 (ref 1.005–1.030)

## 2016-09-13 MED ORDER — ALPRAZOLAM 0.25 MG PO TABS: 0.2500 mg | ORAL_TABLET | Freq: Every evening | ORAL | 0 refills | Status: DC | PRN

## 2016-09-13 MED ORDER — SODIUM CHLORIDE 0.9 % IV SOLN
600.0000 mg/m2 | Freq: Once | INTRAVENOUS | Status: AC
  Administered 2016-09-13: 1180 mg via INTRAVENOUS
  Filled 2016-09-13: qty 9

## 2016-09-13 MED ORDER — SODIUM CHLORIDE 0.9 % IV SOLN
Freq: Once | INTRAVENOUS | Status: AC
  Administered 2016-09-13: 11:00:00 via INTRAVENOUS
  Filled 2016-09-13: qty 1000

## 2016-09-13 MED ORDER — PEGFILGRASTIM 6 MG/0.6ML ~~LOC~~ PSKT
6.0000 mg | PREFILLED_SYRINGE | Freq: Once | SUBCUTANEOUS | Status: AC
  Administered 2016-09-13: 6 mg via SUBCUTANEOUS
  Filled 2016-09-13: qty 0.6

## 2016-09-13 MED ORDER — SODIUM CHLORIDE 0.9 % IV SOLN
Freq: Once | INTRAVENOUS | Status: AC
  Administered 2016-09-13: 11:00:00 via INTRAVENOUS
  Filled 2016-09-13: qty 5

## 2016-09-13 MED ORDER — DOXORUBICIN HCL CHEMO IV INJECTION 2 MG/ML
60.0000 mg/m2 | Freq: Once | INTRAVENOUS | Status: AC
  Administered 2016-09-13: 118 mg via INTRAVENOUS
  Filled 2016-09-13: qty 59

## 2016-09-13 MED ORDER — SODIUM CHLORIDE 0.9% FLUSH
10.0000 mL | Freq: Once | INTRAVENOUS | Status: AC
  Administered 2016-09-13: 10 mL via INTRAVENOUS
  Filled 2016-09-13: qty 10

## 2016-09-13 MED ORDER — HEPARIN SOD (PORK) LOCK FLUSH 100 UNIT/ML IV SOLN
500.0000 [IU] | Freq: Once | INTRAVENOUS | Status: AC
  Administered 2016-09-13: 500 [IU] via INTRAVENOUS
  Filled 2016-09-13: qty 5

## 2016-09-13 MED ORDER — PALONOSETRON HCL INJECTION 0.25 MG/5ML
0.2500 mg | Freq: Once | INTRAVENOUS | Status: AC
  Administered 2016-09-13: 0.25 mg via INTRAVENOUS
  Filled 2016-09-13: qty 5

## 2016-09-13 NOTE — Progress Notes (Signed)
Patient reports anxiety and feeling "jittery" the night of her last treatment.

## 2016-09-14 LAB — URINE CULTURE: Culture: NO GROWTH

## 2016-09-15 DIAGNOSIS — S06351D Traumatic hemorrhage of left cerebrum with loss of consciousness of 30 minutes or less, subsequent encounter: Secondary | ICD-10-CM | POA: Diagnosis not present

## 2016-09-15 DIAGNOSIS — R55 Syncope and collapse: Secondary | ICD-10-CM | POA: Diagnosis not present

## 2016-09-16 DIAGNOSIS — I10 Essential (primary) hypertension: Secondary | ICD-10-CM | POA: Diagnosis not present

## 2016-09-16 DIAGNOSIS — E782 Mixed hyperlipidemia: Secondary | ICD-10-CM | POA: Diagnosis not present

## 2016-09-16 DIAGNOSIS — R55 Syncope and collapse: Secondary | ICD-10-CM | POA: Diagnosis not present

## 2016-09-16 DIAGNOSIS — R0602 Shortness of breath: Secondary | ICD-10-CM | POA: Diagnosis not present

## 2016-09-20 ENCOUNTER — Emergency Department
Admission: EM | Admit: 2016-09-20 | Discharge: 2016-09-20 | Disposition: A | Discharge status: Home / Self Care | Payer: PPO | Attending: Emergency Medicine | Admitting: Emergency Medicine

## 2016-09-20 ENCOUNTER — Encounter: Payer: Self-pay | Admitting: *Deleted

## 2016-09-20 DIAGNOSIS — Z79899 Other long term (current) drug therapy: Secondary | ICD-10-CM | POA: Diagnosis not present

## 2016-09-20 DIAGNOSIS — Z7982 Long term (current) use of aspirin: Secondary | ICD-10-CM | POA: Diagnosis not present

## 2016-09-20 DIAGNOSIS — J029 Acute pharyngitis, unspecified: Secondary | ICD-10-CM | POA: Diagnosis not present

## 2016-09-20 DIAGNOSIS — I10 Essential (primary) hypertension: Secondary | ICD-10-CM | POA: Diagnosis not present

## 2016-09-20 NOTE — ED Triage Notes (Signed)
Pt has a sore throat.  Pt has breast cancer and had chemo last week.  Pt has  left earache also.  Pt alert

## 2016-09-20 NOTE — ED Notes (Signed)
Pt discharged to home.  Discharge instructions reviewed.  Verbalized understanding.  No questions or concerns at this time.  Teach back verified.  Pt in NAD.  No items left in ED.   

## 2016-09-20 NOTE — ED Notes (Signed)
Pt states facial trauma d/t syncopal episode in march, surgically repaired. Pt states left ear and throat soreness since, progressively worse. Pt states feeling knot in throat and has had multiple episodes in the last day where she was unable to swallow and choked. Denies cold symptoms. Pt states recent saliva buildup, described thick white, foul-tasting saliva that is recently developing.

## 2016-09-21 NOTE — ED Provider Notes (Signed)
[facility] Emergency Department Provider Note  ____________________________________________  Time seen: Approximately 4:12 PM  I have reviewed the triage vital signs and the nursing notes.   HISTORY  Chief Complaint Sore Throat    HPI Sandra Fritz is a 82 y.o. female with a history of neuromuscular disorder and Stage 1b ER/PR positive, HER-2 negative invasive carcinoma of the left breast presents to the emergency department with some left jaw soreness. Patient states that she fell approximately 1 month ago and sustained fractures of the upper jaw. She states that she has primarily been on a soft diet and she has recently advanced her diet to more solid food groups. Patient states that she experienced some pharyngitis earlier in the day that spontaneously resolved. Patient states that she is currently asymptomatic. She presents to the emergency department for reassurance. She denies associated chest pain, chest tightness, shortness of breath, nausea, vomiting or abdominal pain. Patient has been afebrile.   Past Medical History:  Diagnosis Date  . Anemia   . Anxiety   . Arthritis   . Blood transfusion without reported diagnosis   . Cataract   . Complication of anesthesia    HARD TO WAKE UP AFTER TONSILLECTOMY  . GERD (gastroesophageal reflux disease)   . Hyperlipidemia   . Hypertension   . Neuromuscular disorder ([place])   . Primary cancer of upper outer quadrant of left female breast ([place]) 07/28/2016    Patient Active Problem List   Diagnosis Date Noted  . Primary cancer of upper outer quadrant of left female breast ([place]) 07/28/2016  . Left breast mass 07/25/2016  . Abnormal finding on mammography 07/25/2016  . Hyperglycemia 04/12/2016  . Absolute anemia 07/25/2014  . Anxiety 07/25/2014  . AB (asthmatic bronchitis) 07/25/2014  . 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 Intervertebral cervical disc disorder with myelopathy, cervical region  07/25/2014  . Chest pain 07/25/2014  . Breath shortness 07/25/2014  . Edema leg 07/25/2014  . Fatigue 07/25/2014  . Acid reflux 07/25/2014  . 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 Arthralgia of hip 07/25/2014  . HLD (hyperlipidemia) 07/25/2014  . Below normal amount of sodium in the blood 07/25/2014  . Allergic state 07/25/2014  . Neuropathy 07/25/2014  . Arthritis, degenerative 07/25/2014  . Peptic ulcer 07/25/2014  . FOOT, PAIN 10/12/2007  . Hyperlipidemia 09/18/2007  . Hypertension 09/18/2007  . PEPTIC ULCER DISEASE 09/18/2007  . PLANTAR FASCIITIS, LEFT 09/18/2007    Past Surgical History:  Procedure Laterality Date  . APPENDECTOMY  1955   SECONDARY TO RUTURED APPENDIX  . CATARACT EXTRACTION, BILATERAL  2011  . COLONOSCOPY  2002  . DILATION AND CURETTAGE OF UTERUS  1989   Dr. Laurey Morale  . JOINT REPLACEMENT     Right hip  . MOLE REMOVAL  1957   18 removed by Dr. Hoyle Sauer  . NECK SURGERY    . PORTACATH PLACEMENT Right 08/05/2016   Procedure: INSERTION PORT-A-CATH WITH ULTRASOUND;  Surgeon: Robert Bellow, MD;  Location: [facility] ORS;  Service: General;  Laterality: Right;  . TONSILLECTOMY  1955    Prior to Admission medications   Medication Sig Start Date End Date Taking? Authorizing Provider  ALPRAZolam (XANAX) 0.25 MG tablet Take 1 tablet (0.25 mg total) by mouth at bedtime as needed for anxiety. 09/13/16   Lloyd Huger, MD  aspirin 81 MG tablet Take 81 mg by mouth daily.  11/13/13   [provider]  clobetasol cream (TEMOVATE) 9.40 % Apply 1 application topically 2 (two)  times daily. ON ARMS    [provider]  esomeprazole (NEXIUM) 20 MG capsule Take 1 capsule (20 mg total) by mouth daily. 07/07/16   Jerrol Banana., MD  furosemide (LASIX) 20 MG tablet TAKE 1 TABLET (20 MG TOTAL) BY MOUTH 2 (TWO) TIMES DAILY. Patient taking differently: Take 20 mg by mouth 2 (two) times daily. Takes 1 daily and and 2nd dose only if needed for fluid retention  04/14/16   Jerrol Banana., MD  hydrocortisone 2.5 % cream Apply 1 application topically 2 (two) times daily. ON FACE    [provider]  lidocaine-prilocaine (EMLA) cream Apply to affected area once 07/28/16   Lloyd Huger, MD  loratadine (CLARITIN) 10 MG tablet Take 10 mg by mouth daily as needed.     [provider]  losartan (COZAAR) 100 MG tablet Take 1 tablet (100 mg total) by mouth daily. 08/09/16   Jerrol Banana., MD  losartan-hydrochlorothiazide [facility]) 100-12.5 MG tablet  07/08/16   [provider]  Misc Natural Products (OSTEO BI-FLEX JOINT SHIELD PO) Take 2 tablets by mouth daily.    [provider]  Multiple Vitamin (MULTIVITAMIN WITH MINERALS) TABS tablet Take 1 tablet by mouth 3 (three) times a week.    [provider]  ondansetron (ZOFRAN) 8 MG tablet Take 1 tablet (8 mg total) by mouth 2 (two) times daily as needed. Start on the third day after chemotherapy. 07/28/16   Lloyd Huger, MD  prochlorperazine (COMPAZINE) 10 MG tablet Take 1 tablet (10 mg total) by mouth every 6 (six) hours as needed (Nausea or vomiting). 07/28/16   Lloyd Huger, MD  rosuvastatin (CRESTOR) 5 MG tablet Take 1 tablet (5 mg total) by mouth every other day. 04/18/16   Jerrol Banana., MD  sucralfate (CARAFATE) 1 g tablet Take 1 tablet (1 g total) by mouth 4 (four) times daily -  with meals and at bedtime. Patient not taking: Reported on 08/09/2016 04/12/16   Jerrol Banana., MD  traMADol Veatrice Bourbon) 50 MG tablet Take 1 tablet (50 mg total) by mouth 2 (two) times daily as needed. 09/12/16   Jerrol Banana., MD    Allergies Keflex  [cephalexin]; Statins; and Erythromycin  Family History  Problem Relation Age of Onset  . Macular degeneration Mother   . Hypertension Mother   . Diabetes Father   . Heart disease Father   . Heart attack Father 27  . Heart disease Brother 40  . Dermatomyositis Sister   . Cancer Sister         Carcinoma of the uterine call attached to colon and is in remission now  . Breast cancer Maternal Aunt 70  . Breast cancer Paternal Aunt 66  . Breast cancer Cousin        paternal aunt w/ breast ca daughter  . Breast cancer Maternal Aunt 60    Social History Social History  Substance Use Topics  . Smoking status: Never Smoker  . Smokeless tobacco: Never Used  . Alcohol use No     Comment: Maybe 3 per year     Review of Systems  Constitutional: No fever/chills Eyes: No visual changes. No discharge ENT: Patient had some left jaw soreness and pharyngitis earlier in the day.  Cardiovascular: no chest pain. Respiratory: no cough. No SOB. Gastrointestinal: No abdominal pain.  No nausea, no vomiting.  No diarrhea.  No constipation. Genitourinary: Negative for dysuria. No hematuria  Musculoskeletal: Negative for musculoskeletal pain. Skin: Negative for rash, abrasions, lacerations, ecchymosis. Neurological: Negative for headaches, focal weakness or numbness.   ____________________________________________   PHYSICAL EXAM:  VITAL SIGNS: ED Triage Vitals [09/20/16 2149]  Enc Vitals Group     BP (!) 182/71     Pulse Rate (!) 102     Resp 20     Temp 98.9 F (37.2 C)     Temp Source Oral     SpO2 98 %     Weight 170 lb (77.1 kg)     Height _0  (1.753 m)     Head Circumference      Peak Flow      Pain Score 9     Pain Loc      Pain Edu?      Excl. in [place]?      Constitutional: Alert and oriented. Well appearing and in no acute distress. Eyes: Conjunctivae are normal. PERRL. EOMI. Head: Atraumatic. ENT:      Ears: Tympanic membranes are pearly bilaterally.      Nose: No congestion/rhinnorhea.      Mouth/Throat: Mucous membranes are moist.  Neck: Full range of motion. Posterior pharynx is nonerythematous. Uvula is midline. Hematological/Lymphatic/Immunilogical: No cervical lymphadenopathy. Cardiovascular: Normal rate, regular rhythm. Normal S1 and S2.  Good  peripheral circulation. Respiratory: Normal respiratory effort without tachypnea or retractions. Lungs CTAB. Good air entry to the bases with no decreased or absent breath sounds. Gastrointestinal: Bowel sounds 4 quadrants. Soft and nontender to palpation. No guarding or rigidity. No palpable masses. No distention. No CVA tenderness. Musculoskeletal: Full range of motion to all extremities. No gross deformities appreciated. Neurologic:  Normal speech and language. No gross focal neurologic deficits are appreciated.  Skin:  Skin is warm, dry and intact. No rash noted. Psychiatric: Mood and affect are normal. Speech and behavior are normal. Patient exhibits appropriate insight and judgement.   ____________________________________________   LABS (all labs ordered are listed, but only abnormal results are displayed)  Labs Reviewed - No data to display ____________________________________________  EKG   ____________________________________________  RADIOLOGY  No results found.  ____________________________________________    PROCEDURES  Procedure(s) performed:    Procedures    Medications - No data to display   ____________________________________________   INITIAL IMPRESSION / ASSESSMENT AND PLAN / ED COURSE  Pertinent labs & imaging results that were available during my care of the patient were reviewed by me and considered in my medical decision making (see chart for details).  Review of the [place] CSRS was performed in accordance of the [place] prior to dispensing any controlled drugs.     Assessment and plan: Acute pharyngitis, unspecified etiology: Patient presents to the emergency department with left jaw soreness and some pharyngitis that spontaneously resolved before presenting to the emergency department. Patient presents to the emergency department for reassurance. Physical exam was reassuring. Patient was advised to follow-up with her primary care provider as  needed. All patient questions were answered. Vital signs are reassuring prior to discharge.     ____________________________________________  FINAL CLINICAL IMPRESSION(S) / ED DIAGNOSES  Final diagnoses:  Acute pharyngitis, unspecified etiology      NEW MEDICATIONS STARTED DURING THIS VISIT:  Discharge Medication List as of 09/20/2016 10:46 PM          This chart was dictated using voice recognition software/Dragon. Despite best efforts to proofread, errors can occur which can change the meaning. Any change was purely unintentional.    Lannie Fields,  PA-C 09/21/16 1621    Schuyler Amor, MD 09/21/16 2241

## 2016-09-23 ENCOUNTER — Other Ambulatory Visit: Payer: Self-pay | Admitting: Neurology

## 2016-09-23 DIAGNOSIS — R55 Syncope and collapse: Secondary | ICD-10-CM

## 2016-09-23 NOTE — Progress Notes (Signed)
[place]  [phone] Fax:([phone]) [phone]  ID: STORMEE DUDA OB: 1935-01-04  MR#: [id number]  ZTI#:[id number]  Patient Care Team: Jerrol Banana., MD as PCP - General (Unknown Physician Specialty) Jerrol Banana., MD (Family Medicine) Bary Castilla Forest Gleason, MD (General Surgery)  CHIEF COMPLAINT: Clinical stage Ib ER/PR positive, HER-2 negative invasive carcinoma of the upper outer quadrant of the left breast.  INTERVAL HISTORY: Patient returns to clinic today for further evaluation and consideration of cycle 4 of 4 of Adriamycin and Cytoxan. She states her Neulasta patch fell off prior to her injection, but failed to let anybody now. She has worsening mouth pain and increased weakness and fatigue after her most recent treatment. She has a poor appetite. She has no neurologic complaints. She denies any recent fevers or illnesses. She has no chest pain or shortness of breath. She denies any nausea, vomiting, constipation, or diarrhea. She has no urinary complaints. Patient offers no further specific complaints.  REVIEW OF SYSTEMS:   Review of Systems  Constitutional: Negative.  Negative for fever, malaise/fatigue and weight loss.  HENT: Positive for sore throat.   Eyes: Negative for blurred vision, double vision and pain.  Respiratory: Negative.  Negative for cough and shortness of breath.   Cardiovascular: Negative.  Negative for chest pain and leg swelling.  Gastrointestinal: Negative.  Negative for abdominal pain.  Genitourinary: Negative.   Musculoskeletal: Negative.   Skin: Negative.  Negative for rash.  Neurological: Negative.  Negative for sensory change, weakness and headaches.  Psychiatric/Behavioral: Negative.  The patient is not nervous/anxious.     As per HPI. Otherwise, a complete review of systems is negative.  PAST MEDICAL HISTORY: Past Medical History:  Diagnosis Date  . Anemia   . Anxiety   . Arthritis   . Blood  transfusion without reported diagnosis   . Cataract   . Complication of anesthesia    HARD TO WAKE UP AFTER TONSILLECTOMY  . GERD (gastroesophageal reflux disease)   . Hyperlipidemia   . Hypertension   . Neuromuscular disorder ([place])   . Primary cancer of upper outer quadrant of left female breast ([place]) 07/28/2016    PAST SURGICAL HISTORY: Past Surgical History:  Procedure Laterality Date  . APPENDECTOMY  1955   SECONDARY TO RUTURED APPENDIX  . CATARACT EXTRACTION, BILATERAL  2011  . COLONOSCOPY  2002  . DILATION AND CURETTAGE OF UTERUS  1989   Dr. Laurey Morale  . JOINT REPLACEMENT     Right hip  . MOLE REMOVAL  1957   18 removed by Dr. Hoyle Sauer  . NECK SURGERY    . PORTACATH PLACEMENT Right 08/05/2016   Procedure: INSERTION PORT-A-CATH WITH ULTRASOUND;  Surgeon: Robert Bellow, MD;  Location: [facility] ORS;  Service: General;  Laterality: Right;  . TONSILLECTOMY  1955    FAMILY HISTORY: Family History  Problem Relation Age of Onset  . Macular degeneration Mother   . Hypertension Mother   . Diabetes Father   . Heart disease Father   . Heart attack Father 35  . Heart disease Brother 33  . Dermatomyositis Sister   . Cancer Sister        Carcinoma of the uterine call attached to colon and is in remission now  . Breast cancer Maternal Aunt 70  . Breast cancer Paternal Aunt 50  . Breast cancer Cousin        paternal aunt w/ breast ca daughter  . Breast cancer Maternal Aunt 60  ADVANCED DIRECTIVES (Y/N):  N  HEALTH MAINTENANCE: Social History  Substance Use Topics  . Smoking status: Never Smoker  . Smokeless tobacco: Never Used  . Alcohol use No     Comment: Maybe 3 per year     Colonoscopy:  PAP:  Bone density:  Lipid panel:  Allergies  Allergen Reactions  . Keflex  [Cephalexin]     hives  . Statins     Muscle and joint pain-patient states she tried all statins.  . Erythromycin Rash    ALL MYCINS    Current Outpatient Prescriptions  Medication Sig  Dispense Refill  . ALPRAZolam (XANAX) 0.25 MG tablet Take 1 tablet (0.25 mg total) by mouth at bedtime as needed for anxiety. 30 tablet 0  . aspirin 81 MG tablet Take 81 mg by mouth daily.     . clobetasol cream (TEMOVATE) 3.84 % Apply 1 application topically 2 (two) times daily. ON ARMS    . esomeprazole (NEXIUM) 20 MG capsule Take 1 capsule (20 mg total) by mouth daily. 90 capsule 3  . furosemide (LASIX) 20 MG tablet TAKE 1 TABLET (20 MG TOTAL) BY MOUTH 2 (TWO) TIMES DAILY. (Patient taking differently: Take 20 mg by mouth 2 (two) times daily. Takes 1 daily and and 2nd dose only if needed for fluid retention) 180 tablet 3  . hydrocortisone 2.5 % cream Apply 1 application topically 2 (two) times daily. ON FACE    . lidocaine-prilocaine (EMLA) cream Apply to affected area once 30 g 3  . loratadine (CLARITIN) 10 MG tablet Take 10 mg by mouth daily as needed.     Marland Kitchen losartan (COZAAR) 100 MG tablet Take 1 tablet (100 mg total) by mouth daily. 90 tablet 3  . losartan-hydrochlorothiazide (HYZAAR) 100-12.5 MG tablet     . Misc Natural Products (OSTEO BI-FLEX JOINT SHIELD PO) Take 2 tablets by mouth daily.    . Multiple Vitamin (MULTIVITAMIN WITH MINERALS) TABS tablet Take 1 tablet by mouth 3 (three) times a week.    . ondansetron (ZOFRAN) 8 MG tablet Take 1 tablet (8 mg total) by mouth 2 (two) times daily as needed. Start on the third day after chemotherapy. 60 tablet 2  . prochlorperazine (COMPAZINE) 10 MG tablet Take 1 tablet (10 mg total) by mouth every 6 (six) hours as needed (Nausea or vomiting). 60 tablet 2  . rosuvastatin (CRESTOR) 5 MG tablet Take 1 tablet (5 mg total) by mouth every other day. 45 tablet 3  . sucralfate (CARAFATE) 1 g tablet Take 1 tablet (1 g total) by mouth 4 (four) times daily -  with meals and at bedtime. 120 tablet 12  . traMADol (ULTRAM) 50 MG tablet Take 1 tablet (50 mg total) by mouth 2 (two) times daily as needed. 60 tablet 5   No current facility-administered medications  for this visit.     OBJECTIVE: Vitals:   09/27/16 1129  BP: (!) 150/73  Pulse: (!) 103  Resp: 20  Temp: 99 F (37.2 C)     Body mass index is 24.38 kg/m.    ECOG FS:0 - Asymptomatic  General: Well-developed, well-nourished, no acute distress. Eyes: Pink conjunctiva, anicteric sclera.   Breasts: Palpable left breast mass. Exam deferred today. HEENT: Multiple ecchymosis on face. Lungs: Clear to auscultation bilaterally. Heart: Regular rate and rhythm. No rubs, murmurs, or gallops. Abdomen: Soft, nontender, nondistended. No organomegaly noted, normoactive bowel sounds. Musculoskeletal: No edema, cyanosis, or clubbing. Neuro: Alert, answering all questions appropriately. Cranial nerves grossly intact.  Skin: No rashes or petechiae noted. Psych: Normal affect.   LAB RESULTS:  Lab Results  Component Value Date   NA 137 09/27/2016   K 3.6 09/27/2016   CL 100 (L) 09/27/2016   CO2 28 09/27/2016   GLUCOSE 118 (H) 09/27/2016   BUN 16 09/27/2016   CREATININE 0.97 09/27/2016   CALCIUM 9.6 09/27/2016   PROT 6.6 09/27/2016   ALBUMIN 3.5 09/27/2016   AST 24 09/27/2016   ALT 21 09/27/2016   ALKPHOS 49 09/27/2016   BILITOT 0.2 (L) 09/27/2016   GFRNONAA 54 (L) 09/27/2016   GFRAA >60 09/27/2016    Lab Results  Component Value Date   WBC 0.6 (LL) 09/27/2016   NEUTROABS 0.1 (L) 09/27/2016   HGB 7.8 (L) 09/27/2016   HCT 22.6 (L) 09/27/2016   MCV 83.9 09/27/2016   PLT 180 09/27/2016     STUDIES: No results found.  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  PLAN:    1. Clinical stage Ib ER/PR positive, HER-2 negative invasive carcinoma of the upper outer quadrant of the left breast: Given the size of patient's breast mass at 2.7 cm, have recommended neoadjuvant chemotherapy using Adriamycin, Cytoxan, and Taxol. She will also require Neulasta support. Pretreatment MUGA scan revealed an EF of  69%. Once she completes neoadjuvant chemotherapy, will refer back to surgery for possible lumpectomy. Patient will also require adjuvant XRT. Finally, given the ER/PR status of her tumor she will benefit from an aromatase inhibitor for 5 years. Delay cycle 4 of 4 of Adriamycin and Cytoxan today secondary to persistent neutropenia. Return to clinic in 1 week for laboratory work and reconsideration of cycle 4.  2. Neutropenia: Patient reports her Neulasta patch fell off prior to injection. She will receive 480 g Neupogen today. Delay treatment 1 week as above. 3. Mouth sores: Likely secondary to neutropenia. Patient was given a prescription for Magic mouthwash. 4. Poor appetite: Patient will receive 1 L of IV fluids today. 5. Syncopal episode: This has not recurred.  6. Subdural hematoma: Secondary to fall. Monitor. 7. Anemia: Mild, monitor. 8. Renal insufficiency: Resolved.  Patient expressed understanding and was in agreement with this plan. She also understands that She can call clinic at any time with any questions, concerns, or complaints.   Cancer Staging Primary cancer of upper outer quadrant of left female breast [facility]) Staging form: Breast, AJCC 8th Edition - Clinical stage from 07/28/2016: Stage IB (cT2, cN0, cM0, G2, ER: Positive, PR: Positive, HER2: Negative) - Signed by Lloyd Huger, MD on 07/28/2016   Lloyd Huger, MD   09/28/2016 12:04 PM

## 2016-09-26 DIAGNOSIS — R55 Syncope and collapse: Secondary | ICD-10-CM | POA: Diagnosis not present

## 2016-09-27 ENCOUNTER — Inpatient Hospital Stay (HOSPITAL_BASED_OUTPATIENT_CLINIC_OR_DEPARTMENT_OTHER): Payer: PPO | Admitting: Oncology

## 2016-09-27 ENCOUNTER — Inpatient Hospital Stay: Payer: PPO

## 2016-09-27 ENCOUNTER — Inpatient Hospital Stay: Payer: PPO | Attending: Oncology

## 2016-09-27 VITALS — BP 150/73 | HR 103 | Temp 99.0°F | Resp 20 | Wt 165.1 lb

## 2016-09-27 DIAGNOSIS — R531 Weakness: Secondary | ICD-10-CM | POA: Insufficient documentation

## 2016-09-27 DIAGNOSIS — E785 Hyperlipidemia, unspecified: Secondary | ICD-10-CM

## 2016-09-27 DIAGNOSIS — Z7689 Persons encountering health services in other specified circumstances: Secondary | ICD-10-CM

## 2016-09-27 DIAGNOSIS — Z79899 Other long term (current) drug therapy: Secondary | ICD-10-CM | POA: Insufficient documentation

## 2016-09-27 DIAGNOSIS — N289 Disorder of kidney and ureter, unspecified: Secondary | ICD-10-CM | POA: Diagnosis not present

## 2016-09-27 DIAGNOSIS — D649 Anemia, unspecified: Secondary | ICD-10-CM | POA: Diagnosis not present

## 2016-09-27 DIAGNOSIS — K219 Gastro-esophageal reflux disease without esophagitis: Secondary | ICD-10-CM | POA: Diagnosis not present

## 2016-09-27 DIAGNOSIS — S065X9S Traumatic subdural hemorrhage with loss of consciousness of unspecified duration, sequela: Secondary | ICD-10-CM

## 2016-09-27 DIAGNOSIS — I10 Essential (primary) hypertension: Secondary | ICD-10-CM | POA: Diagnosis not present

## 2016-09-27 DIAGNOSIS — I739 Peripheral vascular disease, unspecified: Secondary | ICD-10-CM | POA: Diagnosis not present

## 2016-09-27 DIAGNOSIS — Z170 Estrogen receptor positive status [ER+]: Secondary | ICD-10-CM | POA: Diagnosis not present

## 2016-09-27 DIAGNOSIS — F419 Anxiety disorder, unspecified: Secondary | ICD-10-CM | POA: Diagnosis not present

## 2016-09-27 DIAGNOSIS — M129 Arthropathy, unspecified: Secondary | ICD-10-CM

## 2016-09-27 DIAGNOSIS — K1379 Other lesions of oral mucosa: Secondary | ICD-10-CM

## 2016-09-27 DIAGNOSIS — Z7982 Long term (current) use of aspirin: Secondary | ICD-10-CM

## 2016-09-27 DIAGNOSIS — Z5111 Encounter for antineoplastic chemotherapy: Secondary | ICD-10-CM | POA: Diagnosis not present

## 2016-09-27 DIAGNOSIS — Z803 Family history of malignant neoplasm of breast: Secondary | ICD-10-CM | POA: Insufficient documentation

## 2016-09-27 DIAGNOSIS — D709 Neutropenia, unspecified: Secondary | ICD-10-CM | POA: Insufficient documentation

## 2016-09-27 DIAGNOSIS — C50412 Malignant neoplasm of upper-outer quadrant of left female breast: Secondary | ICD-10-CM | POA: Insufficient documentation

## 2016-09-27 DIAGNOSIS — R5383 Other fatigue: Secondary | ICD-10-CM | POA: Insufficient documentation

## 2016-09-27 LAB — CBC WITH DIFFERENTIAL/PLATELET
BASOS ABS: 0 10*3/uL (ref 0–0.1)
Basophils Relative: 7 %
EOS PCT: 1 %
Eosinophils Absolute: 0 10*3/uL (ref 0–0.7)
HEMATOCRIT: 22.6 % — AB (ref 35.0–47.0)
Hemoglobin: 7.8 g/dL — ABNORMAL LOW (ref 12.0–16.0)
LYMPHS ABS: 0.3 10*3/uL — AB (ref 1.0–3.6)
Lymphocytes Relative: 34 %
MCH: 29 pg (ref 26.0–34.0)
MCHC: 34.5 g/dL (ref 32.0–36.0)
MCV: 83.9 fL (ref 80.0–100.0)
MONOS PCT: 39 %
Monocytes Absolute: 0.2 10*3/uL (ref 0.2–0.9)
NEUTROS ABS: 0.1 10*3/uL — AB (ref 1.7–7.7)
Neutrophils Relative %: 19 %
Platelets: 180 10*3/uL (ref 150–440)
RBC: 2.7 MIL/uL — ABNORMAL LOW (ref 3.80–5.20)
RDW: 14.6 % — AB (ref 11.5–14.5)
WBC: 0.6 10*3/uL — CL (ref 3.6–11.0)

## 2016-09-27 LAB — COMPREHENSIVE METABOLIC PANEL
ALBUMIN: 3.5 g/dL (ref 3.5–5.0)
ALT: 21 U/L (ref 14–54)
ANION GAP: 9 (ref 5–15)
AST: 24 U/L (ref 15–41)
Alkaline Phosphatase: 49 U/L (ref 38–126)
BILIRUBIN TOTAL: 0.2 mg/dL — AB (ref 0.3–1.2)
BUN: 16 mg/dL (ref 6–20)
CO2: 28 mmol/L (ref 22–32)
Calcium: 9.6 mg/dL (ref 8.9–10.3)
Chloride: 100 mmol/L — ABNORMAL LOW (ref 101–111)
Creatinine, Ser: 0.97 mg/dL (ref 0.44–1.00)
GFR calc non Af Amer: 54 mL/min — ABNORMAL LOW (ref >60)
GLUCOSE: 118 mg/dL — AB (ref 65–99)
Potassium: 3.6 mmol/L (ref 3.5–5.1)
SODIUM: 137 mmol/L (ref 135–145)
TOTAL PROTEIN: 6.6 g/dL (ref 6.5–8.1)

## 2016-09-27 MED ORDER — FILGRASTIM 480 MCG/0.8ML IJ SOSY: 480.0000 ug | PREFILLED_SYRINGE | Freq: Once | INTRAMUSCULAR | Status: DC

## 2016-09-27 MED ORDER — SODIUM CHLORIDE 0.9% FLUSH
10.0000 mL | INTRAVENOUS | Status: DC | PRN
  Administered 2016-09-27: 10 mL via INTRAVENOUS
  Filled 2016-09-27: qty 10

## 2016-09-27 MED ORDER — HEPARIN SOD (PORK) LOCK FLUSH 100 UNIT/ML IV SOLN
500.0000 [IU] | Freq: Once | INTRAVENOUS | Status: AC
  Administered 2016-09-27: 500 [IU] via INTRAVENOUS

## 2016-09-27 MED ORDER — TBO-FILGRASTIM 480 MCG/0.8ML ~~LOC~~ SOSY
480.0000 ug | PREFILLED_SYRINGE | Freq: Once | SUBCUTANEOUS | Status: AC
  Administered 2016-09-27: 480 ug via SUBCUTANEOUS

## 2016-09-27 MED ORDER — SODIUM CHLORIDE 0.9 % IV SOLN
Freq: Once | INTRAVENOUS | Status: AC
  Administered 2016-09-27: 12:00:00 via INTRAVENOUS
  Filled 2016-09-27: qty 1000

## 2016-09-27 NOTE — Progress Notes (Signed)
Patient reports weakness, fatigue and mouth sores today. Patient also reports that her Neulasta OnPro fell off before being administered after her last chemotherapy treatment.

## 2016-09-29 ENCOUNTER — Ambulatory Visit
Admission: RE | Admit: 2016-09-29 | Discharge: 2016-09-29 | Disposition: A | Discharge status: Home / Self Care | Payer: PPO | Source: Ambulatory Visit | Attending: Neurology | Admitting: Neurology

## 2016-09-29 DIAGNOSIS — Z981 Arthrodesis status: Secondary | ICD-10-CM | POA: Diagnosis not present

## 2016-09-29 DIAGNOSIS — I739 Peripheral vascular disease, unspecified: Secondary | ICD-10-CM | POA: Diagnosis not present

## 2016-09-29 DIAGNOSIS — R55 Syncope and collapse: Secondary | ICD-10-CM | POA: Diagnosis not present

## 2016-09-29 DIAGNOSIS — I619 Nontraumatic intracerebral hemorrhage, unspecified: Secondary | ICD-10-CM | POA: Insufficient documentation

## 2016-09-30 DIAGNOSIS — R55 Syncope and collapse: Secondary | ICD-10-CM | POA: Diagnosis not present

## 2016-10-02 NOTE — Progress Notes (Signed)
[place]  [phone] [phone]  ID: Sandra Fritz OB: 1935/02/11  MR#: [id number]  RSW#:[id number]  Patient Care Team: Jerrol Banana., MD as PCP - General (Unknown Physician Specialty) Jerrol Banana., MD (Family Medicine) Bary Castilla Forest Gleason, MD (General Surgery)  CHIEF COMPLAINT: Clinical stage Ib ER/PR positive, HER-2 negative invasive carcinoma of the upper outer quadrant of the left breast.  INTERVAL HISTORY: Patient returns to clinic today for further evaluation and reconsideration of cycle 4 of 4 of Adriamycin and Cytoxan. She has increased fatigue, but states this is improving. Her mouth pain is also nearly resolved. She has no neurologic complaints. She denies any recent fevers or illnesses. She has no chest pain or shortness of breath. She denies any nausea, vomiting, constipation, or diarrhea. She has no urinary complaints. Patient offers no further specific complaints.  REVIEW OF SYSTEMS:   Review of Systems  Constitutional: Positive for malaise/fatigue. Negative for fever and weight loss.  HENT: Negative for sore throat.   Eyes: Negative for blurred vision, double vision and pain.  Respiratory: Negative.  Negative for cough and shortness of breath.   Cardiovascular: Negative.  Negative for chest pain and leg swelling.  Gastrointestinal: Negative.  Negative for abdominal pain.  Genitourinary: Negative.   Musculoskeletal: Negative.   Skin: Negative.  Negative for rash.  Neurological: Negative.  Negative for sensory change, weakness and headaches.  Psychiatric/Behavioral: Negative.  The patient is not nervous/anxious.     As per HPI. Otherwise, a complete review of systems is negative.  PAST MEDICAL HISTORY: Past Medical History:  Diagnosis Date  . Anemia   . Anxiety   . Arthritis   . Blood transfusion without reported diagnosis   . Cataract   . Complication of anesthesia    HARD TO WAKE UP AFTER  TONSILLECTOMY  . GERD (gastroesophageal reflux disease)   . Hyperlipidemia   . Hypertension   . Neuromuscular disorder ([place])   . Primary cancer of upper outer quadrant of left female breast ([place]) 07/28/2016    PAST SURGICAL HISTORY: Past Surgical History:  Procedure Laterality Date  . APPENDECTOMY  1955   SECONDARY TO RUTURED APPENDIX  . CATARACT EXTRACTION, BILATERAL  2011  . COLONOSCOPY  2002  . DILATION AND CURETTAGE OF UTERUS  1989   Dr. Laurey Morale  . JOINT REPLACEMENT     Right hip  . MOLE REMOVAL  1957   18 removed by Dr. Hoyle Sauer  . NECK SURGERY    . PORTACATH PLACEMENT Right 08/05/2016   Procedure: INSERTION PORT-A-CATH WITH ULTRASOUND;  Surgeon: Robert Bellow, MD;  Location: [facility] ORS;  Service: General;  Laterality: Right;  . TONSILLECTOMY  1955    FAMILY HISTORY: Family History  Problem Relation Age of Onset  . Macular degeneration Mother   . Hypertension Mother   . Diabetes Father   . Heart disease Father   . Heart attack Father 33  . Heart disease Brother 22  . Dermatomyositis Sister   . Cancer Sister        Carcinoma of the uterine call attached to colon and is in remission now  . Breast cancer Maternal Aunt 70  . Breast cancer Paternal Aunt 64  . Breast cancer Cousin        paternal aunt w/ breast ca daughter  . Breast cancer Maternal Aunt 60    ADVANCED DIRECTIVES (Y/N):  N  HEALTH MAINTENANCE: Social History  Substance Use Topics  . Smoking status: Never  Smoker  . Smokeless tobacco: Never Used  . Alcohol use No     Comment: Maybe 3 per year     Colonoscopy:  PAP:  Bone density:  Lipid panel:  Allergies  Allergen Reactions  . Keflex  [Cephalexin]     hives  . Statins     Muscle and joint pain-patient states she tried all statins.  . Erythromycin Rash    ALL MYCINS    Current Outpatient Prescriptions  Medication Sig Dispense Refill  . ALPRAZolam (XANAX) 0.25 MG tablet Take 1 tablet (0.25 mg total) by mouth at bedtime as needed  for anxiety. 30 tablet 0  . aspirin 81 MG tablet Take 81 mg by mouth daily.     . clobetasol cream (TEMOVATE) 2.62 % Apply 1 application topically 2 (two) times daily. ON ARMS    . esomeprazole (NEXIUM) 20 MG capsule Take 1 capsule (20 mg total) by mouth daily. 90 capsule 3  . furosemide (LASIX) 20 MG tablet TAKE 1 TABLET (20 MG TOTAL) BY MOUTH 2 (TWO) TIMES DAILY. (Patient taking differently: Take 20 mg by mouth 2 (two) times daily. Takes 1 daily and and 2nd dose only if needed for fluid retention) 180 tablet 3  . hydrocortisone 2.5 % cream Apply 1 application topically 2 (two) times daily. ON FACE    . lidocaine-prilocaine (EMLA) cream Apply to affected area once 30 g 3  . loratadine (CLARITIN) 10 MG tablet Take 10 mg by mouth daily as needed.     Marland Kitchen losartan (COZAAR) 100 MG tablet Take 1 tablet (100 mg total) by mouth daily. 90 tablet 3  . losartan-hydrochlorothiazide (HYZAAR) 100-12.5 MG tablet     . Misc Natural Products (OSTEO BI-FLEX JOINT SHIELD PO) Take 2 tablets by mouth daily.    . Multiple Vitamin (MULTIVITAMIN WITH MINERALS) TABS tablet Take 1 tablet by mouth 3 (three) times a week.    . ondansetron (ZOFRAN) 8 MG tablet Take 1 tablet (8 mg total) by mouth 2 (two) times daily as needed. Start on the third day after chemotherapy. 60 tablet 2  . prochlorperazine (COMPAZINE) 10 MG tablet Take 1 tablet (10 mg total) by mouth every 6 (six) hours as needed (Nausea or vomiting). 60 tablet 2  . rosuvastatin (CRESTOR) 5 MG tablet Take 1 tablet (5 mg total) by mouth every other day. 45 tablet 3  . sucralfate (CARAFATE) 1 g tablet Take 1 tablet (1 g total) by mouth 4 (four) times daily -  with meals and at bedtime. 120 tablet 12  . traMADol (ULTRAM) 50 MG tablet Take 1 tablet (50 mg total) by mouth 2 (two) times daily as needed. 60 tablet 5   No current facility-administered medications for this visit.     OBJECTIVE: Vitals:   10/04/16 1358  BP: (!) 166/78  Pulse: 84  Resp: 18  Temp: 98.2  F (36.8 C)     Body mass index is 23.94 kg/m.    ECOG FS:0 - Asymptomatic  General: Well-developed, well-nourished, no acute distress. Eyes: Pink conjunctiva, anicteric sclera.   Breasts: Palpable left breast mass. Exam deferred today. HEENT: Clear oropharynx with only mild erythema. Lungs: Clear to auscultation bilaterally. Heart: Regular rate and rhythm. No rubs, murmurs, or gallops. Abdomen: Soft, nontender, nondistended. No organomegaly noted, normoactive bowel sounds. Musculoskeletal: No edema, cyanosis, or clubbing. Neuro: Alert, answering all questions appropriately. Cranial nerves grossly intact. Skin: No rashes or petechiae noted. Psych: Normal affect.   LAB RESULTS:  Lab Results  Component  Value Date   NA 138 10/04/2016   K 3.3 (L) 10/04/2016   CL 101 10/04/2016   CO2 28 10/04/2016   GLUCOSE 116 (H) 10/04/2016   BUN 15 10/04/2016   CREATININE 1.07 (H) 10/04/2016   CALCIUM 9.8 10/04/2016   PROT 7.0 10/04/2016   ALBUMIN 4.0 10/04/2016   AST 28 10/04/2016   ALT 24 10/04/2016   ALKPHOS 52 10/04/2016   BILITOT 0.3 10/04/2016   GFRNONAA 48 (L) 10/04/2016   GFRAA 56 (L) 10/04/2016    Lab Results  Component Value Date   WBC 8.4 10/04/2016   NEUTROABS 6.5 10/04/2016   HGB 9.4 (L) 10/04/2016   HCT 27.8 (L) 10/04/2016   MCV 87.2 10/04/2016   PLT 494 (H) 10/04/2016     STUDIES: Mr Brain Wo Contrast  Result Date: 09/29/2016 CLINICAL DATA:  Syncope and collapse. LEFT-sided headache, dizziness, and blurred vision. EXAM: MRI HEAD WITHOUT CONTRAST TECHNIQUE: Multiplanar, multiecho pulse sequences of the brain and surrounding structures were obtained without intravenous contrast. COMPARISON:  CT head 08/06/2016.  MR head 10/11/2013. FINDINGS: Brain: No acute stroke, acute hemorrhage, mass lesion, or extra-axial fluid. Global atrophy, with hydrocephalus ex vacuo. Chronic microvascular ischemic change. Tiny focus of chronic hemorrhage, LEFT frontal subcortical white  matter, was acute in May. Additional tiny focus of chronic hemorrhage is seen in the RIGHT posterior temporal cortex (series 11, image 14). Neither of these areas were present on the prior MR. Vascular: Flow voids are maintained. Skull and upper cervical spine: Unremarkable marrow signal within limits for visualization. Previous anterior cervical fusion. Sinuses/Orbits: BILATERAL cataract extraction. No significant fluid accumulation or layering fluid in the paranasal sinuses. Other: None. IMPRESSION: Atrophy and small vessel disease.  No acute intracranial findings. Tiny foci of chronic hemorrhage, likely microbleed sequelae of hypertensive cerebrovascular disease. Electronically Signed   By: Staci Righter M.D.   On: 09/29/2016 16:11    ASSESSMENT: Clinical stage Ib ER/PR positive, HER-2 negative invasive carcinoma of the upper outer quadrant of the left breast.  PLAN:    1. Clinical stage Ib ER/PR positive, HER-2 negative invasive carcinoma of the upper outer quadrant of the left breast: Given the size of patient's breast mass at 2.7 cm, have recommended neoadjuvant chemotherapy using Adriamycin, Cytoxan, and Taxol. She will also require Neulasta support. Pretreatment MUGA scan revealed an EF of 69%. Once she completes neoadjuvant chemotherapy, will refer back to surgery for possible lumpectomy. Patient will also require adjuvant XRT. Finally, given the ER/PR status of her tumor she will benefit from an aromatase inhibitor for 5 years. Proceed with cycle 4 of 4 of Adriamycin and Cytoxan today. Return to clinic in 2 weeks for further evaluation and consideration of cycle 1 of 12 of weekly Taxol.  2. Neutropenia: Resolved. Proceed with treatment as above. 3. Mouth sores: Improved. Likely secondary to neutropenia. Patient was given a prescription for Magic mouthwash. 4. Subdural hematoma: Secondary to fall. Monitor. 5. Anemia: Mild, monitor. 6. Renal insufficiency:  Mild, monitor.  Patient expressed  understanding and was in agreement with this plan. She also understands that She can call clinic at any time with any questions, concerns, or complaints.   Cancer Staging Primary cancer of upper outer quadrant of left female breast [facility]) Staging form: Breast, AJCC 8th Edition - Clinical stage from 07/28/2016: Stage IB (cT2, cN0, cM0, G2, ER: Positive, PR: Positive, HER2: Negative) - Signed by Lloyd Huger, MD on 07/28/2016   Lloyd Huger, MD   10/09/2016 10:11 AM

## 2016-10-04 ENCOUNTER — Inpatient Hospital Stay: Payer: PPO

## 2016-10-04 ENCOUNTER — Inpatient Hospital Stay (HOSPITAL_BASED_OUTPATIENT_CLINIC_OR_DEPARTMENT_OTHER): Payer: PPO | Admitting: Oncology

## 2016-10-04 VITALS — BP 166/78 | HR 84 | Temp 98.2°F | Resp 18 | Wt 162.1 lb

## 2016-10-04 DIAGNOSIS — N289 Disorder of kidney and ureter, unspecified: Secondary | ICD-10-CM | POA: Diagnosis not present

## 2016-10-04 DIAGNOSIS — C50412 Malignant neoplasm of upper-outer quadrant of left female breast: Secondary | ICD-10-CM

## 2016-10-04 DIAGNOSIS — Z79899 Other long term (current) drug therapy: Secondary | ICD-10-CM

## 2016-10-04 DIAGNOSIS — K219 Gastro-esophageal reflux disease without esophagitis: Secondary | ICD-10-CM

## 2016-10-04 DIAGNOSIS — S065X9S Traumatic subdural hemorrhage with loss of consciousness of unspecified duration, sequela: Secondary | ICD-10-CM | POA: Diagnosis not present

## 2016-10-04 DIAGNOSIS — D649 Anemia, unspecified: Secondary | ICD-10-CM | POA: Diagnosis not present

## 2016-10-04 DIAGNOSIS — I739 Peripheral vascular disease, unspecified: Secondary | ICD-10-CM

## 2016-10-04 DIAGNOSIS — E785 Hyperlipidemia, unspecified: Secondary | ICD-10-CM

## 2016-10-04 DIAGNOSIS — D709 Neutropenia, unspecified: Secondary | ICD-10-CM

## 2016-10-04 DIAGNOSIS — R5383 Other fatigue: Secondary | ICD-10-CM

## 2016-10-04 DIAGNOSIS — M129 Arthropathy, unspecified: Secondary | ICD-10-CM | POA: Diagnosis not present

## 2016-10-04 DIAGNOSIS — R531 Weakness: Secondary | ICD-10-CM | POA: Diagnosis not present

## 2016-10-04 DIAGNOSIS — Z170 Estrogen receptor positive status [ER+]: Secondary | ICD-10-CM

## 2016-10-04 DIAGNOSIS — Z7689 Persons encountering health services in other specified circumstances: Secondary | ICD-10-CM | POA: Diagnosis not present

## 2016-10-04 DIAGNOSIS — K1379 Other lesions of oral mucosa: Secondary | ICD-10-CM | POA: Diagnosis not present

## 2016-10-04 DIAGNOSIS — Z5111 Encounter for antineoplastic chemotherapy: Secondary | ICD-10-CM | POA: Diagnosis not present

## 2016-10-04 DIAGNOSIS — Z7982 Long term (current) use of aspirin: Secondary | ICD-10-CM

## 2016-10-04 DIAGNOSIS — F419 Anxiety disorder, unspecified: Secondary | ICD-10-CM | POA: Diagnosis not present

## 2016-10-04 DIAGNOSIS — I10 Essential (primary) hypertension: Secondary | ICD-10-CM

## 2016-10-04 DIAGNOSIS — Z803 Family history of malignant neoplasm of breast: Secondary | ICD-10-CM

## 2016-10-04 LAB — CBC WITH DIFFERENTIAL/PLATELET
Basophils Absolute: 0 10*3/uL (ref 0–0.1)
Basophils Relative: 0 %
EOS PCT: 0 %
Eosinophils Absolute: 0 10*3/uL (ref 0–0.7)
HEMATOCRIT: 27.8 % — AB (ref 35.0–47.0)
HEMOGLOBIN: 9.4 g/dL — AB (ref 12.0–16.0)
LYMPHS ABS: 0.5 10*3/uL — AB (ref 1.0–3.6)
LYMPHS PCT: 6 %
MCH: 29.5 pg (ref 26.0–34.0)
MCHC: 33.8 g/dL (ref 32.0–36.0)
MCV: 87.2 fL (ref 80.0–100.0)
Monocytes Absolute: 1.3 10*3/uL — ABNORMAL HIGH (ref 0.2–0.9)
Monocytes Relative: 16 %
NEUTROS ABS: 6.5 10*3/uL (ref 1.4–6.5)
Neutrophils Relative %: 78 %
PLATELETS: 494 10*3/uL — AB (ref 150–440)
RBC: 3.19 MIL/uL — AB (ref 3.80–5.20)
RDW: 19.3 % — ABNORMAL HIGH (ref 11.5–14.5)
WBC: 8.4 10*3/uL (ref 3.6–11.0)

## 2016-10-04 LAB — COMPREHENSIVE METABOLIC PANEL
ALK PHOS: 52 U/L (ref 38–126)
ALT: 24 U/L (ref 14–54)
AST: 28 U/L (ref 15–41)
Albumin: 4 g/dL (ref 3.5–5.0)
Anion gap: 9 (ref 5–15)
BILIRUBIN TOTAL: 0.3 mg/dL (ref 0.3–1.2)
BUN: 15 mg/dL (ref 6–20)
CALCIUM: 9.8 mg/dL (ref 8.9–10.3)
CO2: 28 mmol/L (ref 22–32)
CREATININE: 1.07 mg/dL — AB (ref 0.44–1.00)
Chloride: 101 mmol/L (ref 101–111)
GFR calc non Af Amer: 48 mL/min — ABNORMAL LOW (ref >60)
GFR, EST AFRICAN AMERICAN: 56 mL/min — AB (ref >60)
Glucose, Bld: 116 mg/dL — ABNORMAL HIGH (ref 65–99)
Potassium: 3.3 mmol/L — ABNORMAL LOW (ref 3.5–5.1)
Sodium: 138 mmol/L (ref 135–145)
TOTAL PROTEIN: 7 g/dL (ref 6.5–8.1)

## 2016-10-04 MED ORDER — PEGFILGRASTIM 6 MG/0.6ML ~~LOC~~ PSKT
6.0000 mg | PREFILLED_SYRINGE | Freq: Once | SUBCUTANEOUS | Status: AC
  Administered 2016-10-04: 6 mg via SUBCUTANEOUS
  Filled 2016-10-04: qty 0.6

## 2016-10-04 MED ORDER — SODIUM CHLORIDE 0.9 % IV SOLN
Freq: Once | INTRAVENOUS | Status: AC
  Administered 2016-10-04: 15:00:00 via INTRAVENOUS
  Filled 2016-10-04: qty 1000

## 2016-10-04 MED ORDER — PALONOSETRON HCL INJECTION 0.25 MG/5ML
0.2500 mg | Freq: Once | INTRAVENOUS | Status: AC
  Administered 2016-10-04: 0.25 mg via INTRAVENOUS
  Filled 2016-10-04: qty 5

## 2016-10-04 MED ORDER — DOXORUBICIN HCL CHEMO IV INJECTION 2 MG/ML
60.0000 mg/m2 | Freq: Once | INTRAVENOUS | Status: AC
  Administered 2016-10-04: 118 mg via INTRAVENOUS
  Filled 2016-10-04: qty 59

## 2016-10-04 MED ORDER — FOSAPREPITANT DIMEGLUMINE INJECTION 150 MG
Freq: Once | INTRAVENOUS | Status: AC
  Administered 2016-10-04: 15:00:00 via INTRAVENOUS
  Filled 2016-10-04: qty 5

## 2016-10-04 MED ORDER — HEPARIN SOD (PORK) LOCK FLUSH 100 UNIT/ML IV SOLN
500.0000 [IU] | Freq: Once | INTRAVENOUS | Status: AC | PRN
  Administered 2016-10-04: 500 [IU]

## 2016-10-04 MED ORDER — SODIUM CHLORIDE 0.9 % IV SOLN
600.0000 mg/m2 | Freq: Once | INTRAVENOUS | Status: AC
  Administered 2016-10-04: 1180 mg via INTRAVENOUS
  Filled 2016-10-04: qty 50

## 2016-10-04 NOTE — Progress Notes (Signed)
Patient reports feeling fatigued today, feels much better than last week.

## 2016-10-05 ENCOUNTER — Telehealth: Payer: Self-pay | Admitting: Family Medicine

## 2016-10-05 NOTE — Telephone Encounter (Signed)
Is this just the Valsartan/HCTZ? Please advise.

## 2016-10-05 NOTE — Telephone Encounter (Signed)
Compazine does not replace Nexium. She should continue Nexium unless her cancer doctor tells her otherwise. There is no recall of losartan, only valsartan

## 2016-10-05 NOTE — Telephone Encounter (Signed)
Pt states the Rx losartan (COZAAR) 100 MG tablet is going to be taken off of the market.  Pt is asking if she should continue taking this or stop.  Pt is also asking for a new Rx to replace this.  [facility].   Pt is also asking is she can stop taking the Rx esomeprazole (NEXIUM) 20 MG capsule due to her cancer doctor has given her  prochlorperazine (COMPAZINE) 10 MG tablet for nausea.    NO#[phone]/[id number]

## 2016-10-06 DIAGNOSIS — R569 Unspecified convulsions: Secondary | ICD-10-CM | POA: Diagnosis not present

## 2016-10-06 NOTE — Telephone Encounter (Signed)
Patient was notified. Expressed understanding.  

## 2016-10-12 ENCOUNTER — Ambulatory Visit (INDEPENDENT_AMBULATORY_CARE_PROVIDER_SITE_OTHER): Payer: PPO | Admitting: Family Medicine

## 2016-10-12 VITALS — BP 118/50 | HR 100 | Temp 98.2°F | Resp 14 | Wt 159.0 lb

## 2016-10-12 DIAGNOSIS — R739 Hyperglycemia, unspecified: Secondary | ICD-10-CM | POA: Diagnosis not present

## 2016-10-12 DIAGNOSIS — I10 Essential (primary) hypertension: Secondary | ICD-10-CM

## 2016-10-12 DIAGNOSIS — K219 Gastro-esophageal reflux disease without esophagitis: Secondary | ICD-10-CM

## 2016-10-12 DIAGNOSIS — C50412 Malignant neoplasm of upper-outer quadrant of left female breast: Secondary | ICD-10-CM | POA: Diagnosis not present

## 2016-10-12 DIAGNOSIS — R600 Localized edema: Secondary | ICD-10-CM | POA: Diagnosis not present

## 2016-10-12 LAB — POCT GLYCOSYLATED HEMOGLOBIN (HGB A1C): HEMOGLOBIN A1C: 6.1

## 2016-10-12 MED ORDER — FUROSEMIDE 20 MG PO TABS: 20.0000 mg | ORAL_TABLET | Freq: Every day | ORAL | 0 refills | Status: DC

## 2016-10-12 NOTE — Progress Notes (Signed)
Sandra Fritz  MRN: [id number] DOB: 07/20/1934  Subjective:  HPI  Patient is here for follow up. Patient has had routine lab work done on 04/12/16 and multiple CBC and metC levels since then.  Patient is seen Dr Grayland Ormond and has been going through chemotherapy. Patient states she has felt weak, her skin is starting to break out and turn color. She has noticed her skin been pulled back easier. Patient is concerned if this is normal with chemotherapy or if something else is causing it. Patient has not spoken to Dr Grayland Ormond about these symptoms yet. She does have follow up to see Dr Grayland Ormond on 10/25/16. Wt Readings from Last 3 Encounters:  10/12/16 159 lb (72.1 kg)  10/04/16 162 lb 1.6 oz (73.5 kg)  09/27/16 165 lb 1.6 oz (74.9 kg)    Patient Active Problem List   Diagnosis Date Noted  . Primary cancer of upper outer quadrant of left female breast ([place]) 07/28/2016  . Left breast mass 07/25/2016  . Abnormal finding on mammography 07/25/2016  . Hyperglycemia 04/12/2016  . Absolute anemia 07/25/2014  . Anxiety 07/25/2014  . AB (asthmatic bronchitis) 07/25/2014  . 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 Intervertebral cervical disc disorder with myelopathy, cervical region 07/25/2014  . Chest pain 07/25/2014  . Breath shortness 07/25/2014  . Edema leg 07/25/2014  . Fatigue 07/25/2014  . Acid reflux 07/25/2014  . 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 Arthralgia of hip 07/25/2014  . HLD (hyperlipidemia) 07/25/2014  . Below normal amount of sodium in the blood 07/25/2014  . Allergic state 07/25/2014  . Neuropathy 07/25/2014  . Arthritis, degenerative 07/25/2014  . Peptic ulcer 07/25/2014  . FOOT, PAIN 10/12/2007  . Hyperlipidemia 09/18/2007  . Hypertension 09/18/2007  . PEPTIC ULCER DISEASE 09/18/2007  . PLANTAR FASCIITIS, LEFT 09/18/2007    Past Medical History:  Diagnosis Date  . Anemia   . Anxiety   . Arthritis   . Blood transfusion without reported diagnosis    . Cataract   . Complication of anesthesia    HARD TO WAKE UP AFTER TONSILLECTOMY  . GERD (gastroesophageal reflux disease)   . Hyperlipidemia   . Hypertension   . Neuromuscular disorder ([place])   . Primary cancer of upper outer quadrant of left female breast ([place]) 07/28/2016    Social History   Social History  . Marital status: Widowed    Spouse name: N/A  . Number of children: N/A  . Years of education: N/A   Occupational History  . Not on file.   Social History Main Topics  . Smoking status: Never Smoker  . Smokeless tobacco: Never Used  . Alcohol use No     Comment: Maybe 3 per year  . Drug use: No  . Sexual activity: No   Other Topics Concern  . Not on file   Social History Narrative  . No narrative on file    Outpatient Encounter Prescriptions as of 10/12/2016  Medication Sig Note  . ALPRAZolam (XANAX) 0.25 MG tablet Take 1 tablet (0.25 mg total) by mouth at bedtime as needed for anxiety.   Marland Kitchen esomeprazole (NEXIUM) 20 MG capsule Take 1 capsule (20 mg total) by mouth daily.   . furosemide (LASIX) 20 MG tablet TAKE 1 TABLET (20 MG TOTAL) BY MOUTH 2 (TWO) TIMES DAILY. (Patient taking differently: Take 20 mg by mouth 2 (two) times daily. Takes 1 daily and and 2nd dose only if needed for fluid retention)   . loratadine (CLARITIN) 10 MG  tablet Take 10 mg by mouth daily as needed.    Marland Kitchen losartan (COZAAR) 100 MG tablet Take 1 tablet (100 mg total) by mouth daily.   . ondansetron (ZOFRAN) 8 MG tablet Take 1 tablet (8 mg total) by mouth 2 (two) times daily as needed. Start on the third day after chemotherapy. 08/06/2016: Has not started yet   . prochlorperazine (COMPAZINE) 10 MG tablet Take 1 tablet (10 mg total) by mouth every 6 (six) hours as needed (Nausea or vomiting). 08/06/2016: Has not started yet   . rosuvastatin (CRESTOR) 5 MG tablet Take 1 tablet (5 mg total) by mouth every other day.   . traMADol (ULTRAM) 50 MG tablet Take 1 tablet (50 mg total) by mouth 2 (two) times  daily as needed.   Marland Kitchen aspirin 81 MG tablet Take 81 mg by mouth daily.    . clobetasol cream (TEMOVATE) 4.00 % Apply 1 application topically 2 (two) times daily. ON ARMS   . hydrocortisone 2.5 % cream Apply 1 application topically 2 (two) times daily. ON FACE   . lidocaine-prilocaine (EMLA) cream Apply to affected area once (Patient not taking: Reported on 10/12/2016) 08/06/2016: Has not started  . losartan-hydrochlorothiazide (HYZAAR) 100-12.5 MG tablet    . Misc Natural Products (OSTEO BI-FLEX JOINT SHIELD PO) Take 2 tablets by mouth daily.   . Multiple Vitamin (MULTIVITAMIN WITH MINERALS) TABS tablet Take 1 tablet by mouth 3 (three) times a week.   . sucralfate (CARAFATE) 1 g tablet Take 1 tablet (1 g total) by mouth 4 (four) times daily -  with meals and at bedtime. (Patient not taking: Reported on 10/12/2016)    No facility-administered encounter medications on file as of 10/12/2016.     Allergies  Allergen Reactions  . Keflex  [Cephalexin]     hives  . Statins     Muscle and joint pain-patient states she tried all statins.  . Erythromycin Rash    ALL MYCINS    Review of Systems  Constitutional: Positive for malaise/fatigue.  HENT:       Sores in the mouth, runny nose, congestion  Respiratory: Negative.   Cardiovascular: Negative.   Gastrointestinal: Positive for constipation (sometimes) and nausea (sometimes).  Musculoskeletal: Negative.        Unsteady at times.  Skin:       Skin color change.  Neurological: Positive for weakness. Negative for dizziness, tingling, tremors and headaches.  Psychiatric/Behavioral: The patient is nervous/anxious and has insomnia.     Objective:  BP (!) 118/50   Pulse 100   Temp 98.2 F (36.8 C)   Resp 14   Wt 159 lb (72.1 kg)   SpO2 98%   BMI 23.48 kg/m   Physical Exam  Constitutional: She is oriented to person, place, and time. Vital signs are normal.  Eyes: Pupils are equal, round, and reactive to light. Conjunctivae are normal.    Cardiovascular: Normal rate, regular rhythm, normal heart sounds and intact distal pulses.  Exam reveals no gallop.   No murmur heard. Pulmonary/Chest: Effort normal and breath sounds normal. No respiratory distress. She has no wheezes.  Musculoskeletal: She exhibits no edema or tenderness.  Using a cane to help ambulate  Neurological: She is alert and oriented to person, place, and time.    Assessment and Plan :  1. Hyperglycemia 6.1 today. Stable. - POCT HgB A1C--6.1   2. Primary cancer of upper outer quadrant of left female breast [facility]) Follows Dr Grayland Ormond, going through chemotherapy. I think  skin changes and weakness is from chemotherapy. Follow. 3. Gastroesophageal reflux disease, esophagitis presence not specified Stable.  4. Essential hypertension Advised patient to take Losartan without HCTZ like she has been.   5. Edema leg Advised patient to decrease lasix to 1 tablet daily instead of twice daily. Follow. Did not refill this today just updated in the chart. - furosemide (LASIX) 20 MG tablet; Take 1 tablet (20 mg total) by mouth daily.  Dispense: 30 tablet; Refill: 0  HPI, Exam and A&P transcribed by Theressa Millard, RMA under direction and in the presence of Miguel Aschoff, MD. I have done the exam and reviewed the chart and it is accurate to the best of my knowledge. Development worker, community has been used and  any errors in dictation or transcription are unintentional. Miguel Aschoff M.D. [facility]

## 2016-10-13 DIAGNOSIS — R55 Syncope and collapse: Secondary | ICD-10-CM | POA: Diagnosis not present

## 2016-10-13 DIAGNOSIS — S06351D Traumatic hemorrhage of left cerebrum with loss of consciousness of 30 minutes or less, subsequent encounter: Secondary | ICD-10-CM | POA: Diagnosis not present

## 2016-10-18 ENCOUNTER — Inpatient Hospital Stay: Payer: PPO | Admitting: Oncology

## 2016-10-18 ENCOUNTER — Inpatient Hospital Stay: Payer: PPO

## 2016-10-18 ENCOUNTER — Other Ambulatory Visit: Payer: PPO

## 2016-10-18 ENCOUNTER — Other Ambulatory Visit: Payer: Self-pay | Admitting: *Deleted

## 2016-10-18 ENCOUNTER — Ambulatory Visit: Payer: PPO | Admitting: Oncology

## 2016-10-18 ENCOUNTER — Ambulatory Visit: Payer: PPO

## 2016-10-18 DIAGNOSIS — E782 Mixed hyperlipidemia: Secondary | ICD-10-CM | POA: Diagnosis not present

## 2016-10-18 DIAGNOSIS — I6523 Occlusion and stenosis of bilateral carotid arteries: Secondary | ICD-10-CM | POA: Diagnosis not present

## 2016-10-18 DIAGNOSIS — I10 Essential (primary) hypertension: Secondary | ICD-10-CM | POA: Diagnosis not present

## 2016-10-18 DIAGNOSIS — R55 Syncope and collapse: Secondary | ICD-10-CM | POA: Diagnosis not present

## 2016-10-18 MED ORDER — MAGIC MOUTHWASH W/LIDOCAINE: 5.0000 mL | Freq: Four times a day (QID) | ORAL | 0 refills | Status: DC | PRN

## 2016-10-18 MED ORDER — ALPRAZOLAM 0.25 MG PO TABS: 0.2500 mg | ORAL_TABLET | Freq: Every evening | ORAL | 0 refills | Status: DC | PRN

## 2016-10-19 ENCOUNTER — Inpatient Hospital Stay: Payer: PPO

## 2016-10-19 ENCOUNTER — Inpatient Hospital Stay (HOSPITAL_BASED_OUTPATIENT_CLINIC_OR_DEPARTMENT_OTHER): Payer: PPO | Admitting: Oncology

## 2016-10-19 ENCOUNTER — Inpatient Hospital Stay: Payer: PPO | Attending: Oncology

## 2016-10-19 VITALS — BP 156/72 | HR 96 | Resp 16

## 2016-10-19 VITALS — BP 150/74 | HR 106 | Temp 97.5°F | Resp 20 | Wt 157.4 lb

## 2016-10-19 DIAGNOSIS — K219 Gastro-esophageal reflux disease without esophagitis: Secondary | ICD-10-CM | POA: Insufficient documentation

## 2016-10-19 DIAGNOSIS — I739 Peripheral vascular disease, unspecified: Secondary | ICD-10-CM

## 2016-10-19 DIAGNOSIS — F419 Anxiety disorder, unspecified: Secondary | ICD-10-CM

## 2016-10-19 DIAGNOSIS — D709 Neutropenia, unspecified: Secondary | ICD-10-CM | POA: Insufficient documentation

## 2016-10-19 DIAGNOSIS — Z7982 Long term (current) use of aspirin: Secondary | ICD-10-CM | POA: Insufficient documentation

## 2016-10-19 DIAGNOSIS — N289 Disorder of kidney and ureter, unspecified: Secondary | ICD-10-CM | POA: Insufficient documentation

## 2016-10-19 DIAGNOSIS — R5383 Other fatigue: Secondary | ICD-10-CM | POA: Diagnosis not present

## 2016-10-19 DIAGNOSIS — Z803 Family history of malignant neoplasm of breast: Secondary | ICD-10-CM | POA: Diagnosis not present

## 2016-10-19 DIAGNOSIS — D649 Anemia, unspecified: Secondary | ICD-10-CM

## 2016-10-19 DIAGNOSIS — T451X5A Adverse effect of antineoplastic and immunosuppressive drugs, initial encounter: Principal | ICD-10-CM

## 2016-10-19 DIAGNOSIS — Z170 Estrogen receptor positive status [ER+]: Secondary | ICD-10-CM

## 2016-10-19 DIAGNOSIS — I10 Essential (primary) hypertension: Secondary | ICD-10-CM | POA: Diagnosis not present

## 2016-10-19 DIAGNOSIS — I6200 Nontraumatic subdural hemorrhage, unspecified: Secondary | ICD-10-CM

## 2016-10-19 DIAGNOSIS — E785 Hyperlipidemia, unspecified: Secondary | ICD-10-CM

## 2016-10-19 DIAGNOSIS — G620 Drug-induced polyneuropathy: Secondary | ICD-10-CM

## 2016-10-19 DIAGNOSIS — R110 Nausea: Secondary | ICD-10-CM | POA: Diagnosis not present

## 2016-10-19 DIAGNOSIS — G919 Hydrocephalus, unspecified: Secondary | ICD-10-CM

## 2016-10-19 DIAGNOSIS — Z79899 Other long term (current) drug therapy: Secondary | ICD-10-CM | POA: Diagnosis not present

## 2016-10-19 DIAGNOSIS — G629 Polyneuropathy, unspecified: Secondary | ICD-10-CM

## 2016-10-19 DIAGNOSIS — Z5111 Encounter for antineoplastic chemotherapy: Secondary | ICD-10-CM | POA: Insufficient documentation

## 2016-10-19 DIAGNOSIS — E876 Hypokalemia: Secondary | ICD-10-CM | POA: Diagnosis not present

## 2016-10-19 DIAGNOSIS — M129 Arthropathy, unspecified: Secondary | ICD-10-CM

## 2016-10-19 DIAGNOSIS — R55 Syncope and collapse: Secondary | ICD-10-CM

## 2016-10-19 DIAGNOSIS — C50412 Malignant neoplasm of upper-outer quadrant of left female breast: Secondary | ICD-10-CM

## 2016-10-19 DIAGNOSIS — E860 Dehydration: Secondary | ICD-10-CM | POA: Diagnosis not present

## 2016-10-19 LAB — CBC WITH DIFFERENTIAL/PLATELET
BASOS ABS: 0 10*3/uL (ref 0–0.1)
BASOS PCT: 1 %
Eosinophils Absolute: 0 10*3/uL (ref 0–0.7)
Eosinophils Relative: 0 %
HEMATOCRIT: 24.9 % — AB (ref 35.0–47.0)
HEMOGLOBIN: 8.5 g/dL — AB (ref 12.0–16.0)
Lymphocytes Relative: 10 %
Lymphs Abs: 0.5 10*3/uL — ABNORMAL LOW (ref 1.0–3.6)
MCH: 30.4 pg (ref 26.0–34.0)
MCHC: 34 g/dL (ref 32.0–36.0)
MCV: 89.5 fL (ref 80.0–100.0)
MONO ABS: 0.7 10*3/uL (ref 0.2–0.9)
MONOS PCT: 14 %
NEUTROS ABS: 4.1 10*3/uL (ref 1.4–6.5)
Neutrophils Relative %: 75 %
Platelets: 222 10*3/uL (ref 150–440)
RBC: 2.78 MIL/uL — AB (ref 3.80–5.20)
RDW: 20.6 % — ABNORMAL HIGH (ref 11.5–14.5)
WBC: 5.4 10*3/uL (ref 3.6–11.0)

## 2016-10-19 LAB — COMPREHENSIVE METABOLIC PANEL
ALK PHOS: 56 U/L (ref 38–126)
ALT: 21 U/L (ref 14–54)
AST: 28 U/L (ref 15–41)
Albumin: 3.8 g/dL (ref 3.5–5.0)
Anion gap: 9 (ref 5–15)
BILIRUBIN TOTAL: 0.2 mg/dL — AB (ref 0.3–1.2)
BUN: 18 mg/dL (ref 6–20)
CALCIUM: 9.6 mg/dL (ref 8.9–10.3)
CO2: 30 mmol/L (ref 22–32)
Chloride: 99 mmol/L — ABNORMAL LOW (ref 101–111)
Creatinine, Ser: 1.16 mg/dL — ABNORMAL HIGH (ref 0.44–1.00)
GFR calc Af Amer: 51 mL/min — ABNORMAL LOW (ref >60)
GFR calc non Af Amer: 44 mL/min — ABNORMAL LOW (ref >60)
GLUCOSE: 116 mg/dL — AB (ref 65–99)
Potassium: 3.4 mmol/L — ABNORMAL LOW (ref 3.5–5.1)
SODIUM: 138 mmol/L (ref 135–145)
Total Protein: 6.5 g/dL (ref 6.5–8.1)

## 2016-10-19 MED ORDER — FLUTICASONE PROPIONATE 50 MCG/ACT NA SUSP: 1.0000 | Freq: Every day | NASAL | 2 refills | Status: DC

## 2016-10-19 MED ORDER — DEXAMETHASONE SODIUM PHOSPHATE 10 MG/ML IJ SOLN
10.0000 mg | Freq: Once | INTRAMUSCULAR | Status: AC
  Administered 2016-10-19: 10 mg via INTRAVENOUS
  Filled 2016-10-19: qty 1

## 2016-10-19 MED ORDER — SODIUM CHLORIDE 0.9 % IV SOLN
Freq: Once | INTRAVENOUS | Status: AC
  Administered 2016-10-19: 12:00:00 via INTRAVENOUS
  Filled 2016-10-19: qty 1000

## 2016-10-19 MED ORDER — DIPHENHYDRAMINE HCL 50 MG/ML IJ SOLN
25.0000 mg | Freq: Once | INTRAMUSCULAR | Status: AC
  Administered 2016-10-19: 25 mg via INTRAVENOUS
  Filled 2016-10-19: qty 1

## 2016-10-19 MED ORDER — FAMOTIDINE IN NACL 20-0.9 MG/50ML-% IV SOLN
20.0000 mg | Freq: Once | INTRAVENOUS | Status: AC
  Administered 2016-10-19: 20 mg via INTRAVENOUS
  Filled 2016-10-19: qty 50

## 2016-10-19 MED ORDER — HEPARIN SOD (PORK) LOCK FLUSH 100 UNIT/ML IV SOLN
500.0000 [IU] | Freq: Once | INTRAVENOUS | Status: AC | PRN
  Administered 2016-10-19: 500 [IU]
  Filled 2016-10-19: qty 5

## 2016-10-19 MED ORDER — SODIUM CHLORIDE 0.9 % IV SOLN
80.0000 mg/m2 | Freq: Once | INTRAVENOUS | Status: AC
  Administered 2016-10-19: 156 mg via INTRAVENOUS
  Filled 2016-10-19: qty 26

## 2016-10-19 MED ORDER — DEXAMETHASONE SODIUM PHOSPHATE 100 MG/10ML IJ SOLN: 10.0000 mg | Freq: Once | INTRAMUSCULAR | Status: DC

## 2016-10-19 MED ORDER — SODIUM CHLORIDE 0.9% FLUSH
10.0000 mL | INTRAVENOUS | Status: DC | PRN
  Administered 2016-10-19: 10 mL
  Filled 2016-10-19: qty 10

## 2016-10-19 NOTE — Progress Notes (Signed)
Patient denies any concerns today.  

## 2016-10-19 NOTE — Progress Notes (Addendum)
 [facility]  [phone] [phone]  ID: Sandra Fritz OB: 08/25/1934  MR#: [id number]  CSN#:[id number]  Patient Care Team: Gilbert, Richard L Jr., MD as PCP - General (Unknown Physician Specialty) Gilbert, Richard L Jr., MD (Family Medicine) Byrnett, Jeffrey W, MD (General Surgery)  CHIEF COMPLAINT: Clinical stage Ib ER/PR positive, HER-2 negative invasive carcinoma of the upper outer quadrant of the left breast.  INTERVAL HISTORY: Patient returns to clinic today for further evaluation and consideration of cycle 1 or 12 of Carbo/Taxol.  She finished 4 cycles of AC on July 17th, 2018. She continues to have increasing fatigue. Her mouth pain has resolved. She complains of severe tingling/numbness in bilateral lower extremity. The numbness was so bad last night that she was unable to sleep. This is the first time she has had pain in lower extremities. She did not take anything for this pain. She denies any recent fevers or illnesses. She has no chest pain or shortness of breath. She denies any nausea, vomiting, constipation, or diarrhea. She has no urinary complaints. Patient offers no further specific complaints.  REVIEW OF SYSTEMS:   Review of Systems  Constitutional: Positive for malaise/fatigue. Negative for fever and weight loss.  HENT: Negative for sore throat.   Eyes: Negative for blurred vision, double vision and pain.  Respiratory: Negative.  Negative for cough and shortness of breath.   Cardiovascular: Negative.  Negative for chest pain and leg swelling.  Gastrointestinal: Negative.  Negative for abdominal pain.  Genitourinary: Negative.   Musculoskeletal: Negative.   Skin: Negative.  Negative for rash.  Neurological: Positive for sensory change. Negative for weakness and headaches.       New onset bilateral lower extremity neuropathy.    Psychiatric/Behavioral: Negative.  The patient is not nervous/anxious.     As per HPI. Otherwise,  a complete review of systems is negative.  PAST MEDICAL HISTORY: Past Medical History:  Diagnosis Date  . Anemia   . Anxiety   . Arthritis   . Blood transfusion without reported diagnosis   . Cataract   . Complication of anesthesia    HARD TO WAKE UP AFTER TONSILLECTOMY  . GERD (gastroesophageal reflux disease)   . Hyperlipidemia   . Hypertension   . Neuromuscular disorder (HCC)   . Primary cancer of upper outer quadrant of left female breast (HCC) 07/28/2016    PAST SURGICAL HISTORY: Past Surgical History:  Procedure Laterality Date  . APPENDECTOMY  1955   SECONDARY TO RUTURED APPENDIX  . CATARACT EXTRACTION, BILATERAL  2011  . COLONOSCOPY  2002  . DILATION AND CURETTAGE OF UTERUS  1989   Dr. Rosenow  . JOINT REPLACEMENT     Right hip  . MOLE REMOVAL  1957   18 removed by Dr. Cannington  . NECK SURGERY    . PORTACATH PLACEMENT Right 08/05/2016   Procedure: INSERTION PORT-A-CATH WITH ULTRASOUND;  Surgeon: Byrnett, Jeffrey W, MD;  Location: [facility] ORS;  Service: General;  Laterality: Right;  . TONSILLECTOMY  1955    FAMILY HISTORY: Family History  Problem Relation Age of Onset  . Macular degeneration Mother   . Hypertension Mother   . Diabetes Father   . Heart disease Father   . Heart attack Father 68  . Heart disease Brother 63  . Dermatomyositis Sister   . Cancer Sister        Carcinoma of the uterine call attached to colon and is in remission now  . Breast cancer Maternal Aunt   70  . Breast cancer Paternal Aunt 80  . Breast cancer Cousin        paternal aunt w/ breast ca daughter  . Breast cancer Maternal Aunt 60    ADVANCED DIRECTIVES (Y/N):  N  HEALTH MAINTENANCE: Social History  Substance Use Topics  . Smoking status: Never Smoker  . Smokeless tobacco: Never Used  . Alcohol use No     Comment: Maybe 3 per year     Colonoscopy:  PAP:  Bone density:  Lipid panel:  Allergies  Allergen Reactions  . Keflex  [Cephalexin]     hives  . Statins      Muscle and joint pain-patient states she tried all statins.  . Erythromycin Rash    ALL MYCINS    Current Outpatient Prescriptions  Medication Sig Dispense Refill  . ALPRAZolam (XANAX) 0.25 MG tablet Take 1 tablet (0.25 mg total) by mouth at bedtime as needed for anxiety. 30 tablet 0  . aspirin 81 MG tablet Take 81 mg by mouth daily.     . clobetasol cream (TEMOVATE) 0.05 % Apply 1 application topically 2 (two) times daily. ON ARMS    . esomeprazole (NEXIUM) 20 MG capsule Take 1 capsule (20 mg total) by mouth daily. 90 capsule 3  . furosemide (LASIX) 20 MG tablet Take 1 tablet (20 mg total) by mouth daily. 30 tablet 0  . hydrocortisone 2.5 % cream Apply 1 application topically 2 (two) times daily. ON FACE    . fluticasone (FLONASE) 50 MCG/ACT nasal spray Place 1 spray into both nostrils daily. 16 g 2  . lidocaine-prilocaine (EMLA) cream Apply to affected area once (Patient not taking: Reported on 10/12/2016) 30 g 3  . loratadine (CLARITIN) 10 MG tablet Take 10 mg by mouth daily as needed.     . losartan (COZAAR) 100 MG tablet Take 1 tablet (100 mg total) by mouth daily. 90 tablet 3  . magic mouthwash w/lidocaine SOLN Take 5 mLs by mouth 4 (four) times daily as needed for mouth pain. 240 mL 0  . Misc Natural Products (OSTEO BI-FLEX JOINT SHIELD PO) Take 2 tablets by mouth daily.    . Multiple Vitamin (MULTIVITAMIN WITH MINERALS) TABS tablet Take 1 tablet by mouth 3 (three) times a week.    . ondansetron (ZOFRAN) 8 MG tablet Take 1 tablet (8 mg total) by mouth 2 (two) times daily as needed. Start on the third day after chemotherapy. 60 tablet 2  . prochlorperazine (COMPAZINE) 10 MG tablet Take 1 tablet (10 mg total) by mouth every 6 (six) hours as needed (Nausea or vomiting). 60 tablet 2  . rosuvastatin (CRESTOR) 5 MG tablet Take 1 tablet (5 mg total) by mouth every other day. 45 tablet 3  . sucralfate (CARAFATE) 1 g tablet Take 1 tablet (1 g total) by mouth 4 (four) times daily -  with meals and  at bedtime. (Patient not taking: Reported on 10/12/2016) 120 tablet 12  . traMADol (ULTRAM) 50 MG tablet Take 1 tablet (50 mg total) by mouth 2 (two) times daily as needed. 60 tablet 5   No current facility-administered medications for this visit.     OBJECTIVE: Vitals:   10/19/16 1112  BP: (!) 150/74  Pulse: (!) 106  Resp: 20  Temp: (!) 97.5 F (36.4 C)     Body mass index is 23.24 kg/m.    ECOG FS:0 - Asymptomatic  General: Well-developed, well-nourished, no acute distress. Eyes: Pink conjunctiva, anicteric sclera.   Breasts: Palpable   left breast mass. Exam deferred today. HEENT: Clear oropharynx with only mild erythema. Lungs: Clear to auscultation bilaterally. Heart: Regular rate and rhythm. No rubs, murmurs, or gallops. Abdomen: Soft, nontender, nondistended. No organomegaly noted, normoactive bowel sounds. Musculoskeletal: No edema, cyanosis, or clubbing. Neuro: Alert, answering all questions appropriately. Cranial nerves grossly intact. Skin: No rashes or petechiae noted. Psych: Normal affect.   LAB RESULTS:  Lab Results  Component Value Date   NA 138 10/19/2016   K 3.4 (L) 10/19/2016   CL 99 (L) 10/19/2016   CO2 30 10/19/2016   GLUCOSE 116 (H) 10/19/2016   BUN 18 10/19/2016   CREATININE 1.16 (H) 10/19/2016   CALCIUM 9.6 10/19/2016   PROT 6.5 10/19/2016   ALBUMIN 3.8 10/19/2016   AST 28 10/19/2016   ALT 21 10/19/2016   ALKPHOS 56 10/19/2016   BILITOT 0.2 (L) 10/19/2016   GFRNONAA 44 (L) 10/19/2016   GFRAA 51 (L) 10/19/2016    Lab Results  Component Value Date   WBC 5.4 10/19/2016   NEUTROABS 4.1 10/19/2016   HGB 8.5 (L) 10/19/2016   HCT 24.9 (L) 10/19/2016   MCV 89.5 10/19/2016   PLT 222 10/19/2016     STUDIES: Mr Brain Wo Contrast  Result Date: 09/29/2016 CLINICAL DATA:  Syncope and collapse. LEFT-sided headache, dizziness, and blurred vision. EXAM: MRI HEAD WITHOUT CONTRAST TECHNIQUE: Multiplanar, multiecho pulse sequences of the brain and  surrounding structures were obtained without intravenous contrast. COMPARISON:  CT head 08/06/2016.  MR head 10/11/2013. FINDINGS: Brain: No acute stroke, acute hemorrhage, mass lesion, or extra-axial fluid. Global atrophy, with hydrocephalus ex vacuo. Chronic microvascular ischemic change. Tiny focus of chronic hemorrhage, LEFT frontal subcortical white matter, was acute in May. Additional tiny focus of chronic hemorrhage is seen in the RIGHT posterior temporal cortex (series 11, image 14). Neither of these areas were present on the prior MR. Vascular: Flow voids are maintained. Skull and upper cervical spine: Unremarkable marrow signal within limits for visualization. Previous anterior cervical fusion. Sinuses/Orbits: BILATERAL cataract extraction. No significant fluid accumulation or layering fluid in the paranasal sinuses. Other: None. IMPRESSION: Atrophy and small vessel disease.  No acute intracranial findings. Tiny foci of chronic hemorrhage, likely microbleed sequelae of hypertensive cerebrovascular disease. Electronically Signed   By: John T Curnes M.D.   On: 09/29/2016 16:11    ASSESSMENT: Clinical stage Ib ER/PR positive, HER-2 negative invasive carcinoma of the upper outer quadrant of the left breast.  PLAN:    1. Clinical stage Ib ER/PR positive, HER-2 negative invasive carcinoma of the upper outer quadrant of the left breast: Given the size of patient's breast mass at 2.7 cm, have recommended neoadjuvant chemotherapy using Adriamycin, Cytoxan, and Taxol. She will also require Neulasta support. Pretreatment MUGA scan revealed an EF of 69%. Once she completes neoadjuvant chemotherapy, will refer back to surgery for possible lumpectomy. Patient will also require adjuvant XRT. Finally, given the ER/PR status of her tumor she will benefit from an aromatase inhibitor for 5 years. Proceed with cycle 1 of 12 of Taxol.  2. Neutropenia: Resolved. Proceed with treatment as above. 3. Mouth sores:  Improved. Likely secondary to neutropenia. Continue Magic mouthwash PRN. 4. Subdural hematoma: Secondary to fall. She has not fallen recently. This continues to improve. 5. Anemia: Mild, monitor. Hemoglobin 8.5 today.  6. Renal insufficiency:  Mild, stable, monitor. 7. Neuropathy: Spoke with Dr. Brahmanday about new onset bilateral lower extremity neuropathy. Okay to proceed with scheduled cycle 1 of Taxol today. Encouraged patient to let   us know if this worsens.  Patient expressed understanding and was in agreement with this plan. She also understands that She can call clinic at any time with any questions, concerns, or complaints.   Cancer Staging Primary cancer of upper outer quadrant of left female breast (HCC) Staging form: Breast, AJCC 8th Edition - Clinical stage from 07/28/2016: Stage IB (cT2, cN0, cM0, G2, ER: Positive, PR: Positive, HER2: Negative) - Signed by Finnegan, Timothy J, MD on 07/28/2016   Jennifer E Burns, NP   10/20/2016 8:18 AM     

## 2016-10-24 NOTE — Progress Notes (Signed)
[place]  [phone] [phone]  ID: Sandra Fritz OB: [birth year]  MR#: [id number]  FEO#:[id number]  Patient Care Team: Jerrol Banana., MD as PCP - General (Unknown Physician Specialty) Jerrol Banana., MD (Family Medicine) Bary Castilla Forest Gleason, MD (General Surgery)  CHIEF COMPLAINT: Clinical stage Ib ER/PR positive, HER-2 negative invasive carcinoma of the upper outer quadrant of the left breast.  INTERVAL HISTORY: Patient returns to clinic today for further evaluation and reconsideration of cycle 2 of 12 of weekly Taxol. She has increased fatigue, but this is improving. Her mouth pain is also nearly resolved. She does not complain of bilateral lower extremity numbness and tingling. She has no neurologic complaints. She denies any recent fevers or illnesses. She has no chest pain or shortness of breath. She denies any nausea, vomiting, constipation, or diarrhea. She has no urinary complaints. Patient offers no further specific complaints.  REVIEW OF SYSTEMS:   Review of Systems  Constitutional: Positive for malaise/fatigue. Negative for fever and weight loss.  HENT: Negative for sore throat.   Eyes: Negative for blurred vision, double vision and pain.  Respiratory: Negative.  Negative for cough and shortness of breath.   Cardiovascular: Negative.  Negative for chest pain and leg swelling.  Gastrointestinal: Negative.  Negative for abdominal pain.  Genitourinary: Negative.   Musculoskeletal: Negative.   Skin: Negative.  Negative for rash.  Neurological: Negative.  Negative for sensory change, weakness and headaches.  Psychiatric/Behavioral: Negative.  The patient is not nervous/anxious.     As per HPI. Otherwise, a complete review of systems is negative.  PAST MEDICAL HISTORY: Past Medical History:  Diagnosis Date  . Anemia   . Anxiety   . Arthritis   . Blood transfusion without reported diagnosis   . Cataract   .  Complication of anesthesia    HARD TO WAKE UP AFTER TONSILLECTOMY  . GERD (gastroesophageal reflux disease)   . Hyperlipidemia   . Hypertension   . Neuromuscular disorder ([place])   . Primary cancer of upper outer quadrant of left female breast ([place]) 07/28/2016    PAST SURGICAL HISTORY: Past Surgical History:  Procedure Laterality Date  . APPENDECTOMY  1955   SECONDARY TO RUTURED APPENDIX  . CATARACT EXTRACTION, BILATERAL  2011  . COLONOSCOPY  2002  . DILATION AND CURETTAGE OF UTERUS  1989   Dr. Laurey Morale  . JOINT REPLACEMENT     Right hip  . MOLE REMOVAL  1957   18 removed by Dr. Hoyle Sauer  . NECK SURGERY    . PORTACATH PLACEMENT Right 08/05/2016   Procedure: INSERTION PORT-A-CATH WITH ULTRASOUND;  Surgeon: Robert Bellow, MD;  Location: [facility] ORS;  Service: General;  Laterality: Right;  . TONSILLECTOMY  1955    FAMILY HISTORY: Family History  Problem Relation Age of Onset  . Macular degeneration Mother   . Hypertension Mother   . Diabetes Father   . Heart disease Father   . Heart attack Father 43  . Heart disease Brother 34  . Dermatomyositis Sister   . Cancer Sister        Carcinoma of the uterine call attached to colon and is in remission now  . Breast cancer Maternal Aunt 70  . Breast cancer Paternal Aunt 32  . Breast cancer Cousin        paternal aunt w/ breast ca daughter  . Breast cancer Maternal Aunt 57    ADVANCED DIRECTIVES (Y/N):  N  HEALTH MAINTENANCE: Social History  Substance Use Topics  . Smoking status: Never Smoker  . Smokeless tobacco: Never Used  . Alcohol use No     Comment: Maybe 3 per year     Colonoscopy:  PAP:  Bone density:  Lipid panel:  Allergies  Allergen Reactions  . Keflex  [Cephalexin]     hives  . Statins     Muscle and joint pain-patient states she tried all statins.  . Erythromycin Rash    ALL MYCINS    Current Outpatient Prescriptions  Medication Sig Dispense Refill  . ALPRAZolam (XANAX) 0.25 MG tablet Take 1  tablet (0.25 mg total) by mouth at bedtime as needed for anxiety. 30 tablet 0  . aspirin 81 MG tablet Take 81 mg by mouth daily.     . clobetasol cream (TEMOVATE) 1.61 % Apply 1 application topically 2 (two) times daily. ON ARMS    . esomeprazole (NEXIUM) 20 MG capsule Take 1 capsule (20 mg total) by mouth daily. 90 capsule 3  . fluticasone (FLONASE) 50 MCG/ACT nasal spray Place 1 spray into both nostrils daily. 16 g 2  . furosemide (LASIX) 20 MG tablet Take 1 tablet (20 mg total) by mouth daily. 30 tablet 0  . hydrocortisone 2.5 % cream Apply 1 application topically 2 (two) times daily. ON FACE    . loratadine (CLARITIN) 10 MG tablet Take 10 mg by mouth daily as needed.     Marland Kitchen losartan (COZAAR) 100 MG tablet Take 1 tablet (100 mg total) by mouth daily. 90 tablet 3  . magic mouthwash w/lidocaine SOLN Take 5 mLs by mouth 4 (four) times daily as needed for mouth pain. 240 mL 0  . Misc Natural Products (OSTEO BI-FLEX JOINT SHIELD PO) Take 2 tablets by mouth daily.    . Multiple Vitamin (MULTIVITAMIN WITH MINERALS) TABS tablet Take 1 tablet by mouth 3 (three) times a week.    . ondansetron (ZOFRAN) 8 MG tablet Take 1 tablet (8 mg total) by mouth 2 (two) times daily as needed. Start on the third day after chemotherapy. 60 tablet 2  . prochlorperazine (COMPAZINE) 10 MG tablet Take 1 tablet (10 mg total) by mouth every 6 (six) hours as needed (Nausea or vomiting). 60 tablet 2  . rosuvastatin (CRESTOR) 5 MG tablet Take 1 tablet (5 mg total) by mouth every other day. 45 tablet 3  . traMADol (ULTRAM) 50 MG tablet Take 1 tablet (50 mg total) by mouth 2 (two) times daily as needed. 60 tablet 5  . lidocaine-prilocaine (EMLA) cream Apply to affected area once (Patient not taking: Reported on 10/12/2016) 30 g 3  . sucralfate (CARAFATE) 1 g tablet Take 1 tablet (1 g total) by mouth 4 (four) times daily -  with meals and at bedtime. (Patient not taking: Reported on 10/25/2016) 120 tablet 12   No current  facility-administered medications for this visit.     OBJECTIVE: Vitals:   10/25/16 1012  BP: (!) 147/71  Pulse: 94  Resp: 18  Temp: 97.6 F (36.4 C)     Body mass index is 23.02 kg/m.    ECOG FS:0 - Asymptomatic  General: Well-developed, well-nourished, no acute distress. Eyes: Pink conjunctiva, anicteric sclera.   Breasts: Palpable left breast mass. Exam deferred today. HEENT: Clear oropharynx with only mild erythema. Lungs: Clear to auscultation bilaterally. Heart: Regular rate and rhythm. No rubs, murmurs, or gallops. Abdomen: Soft, nontender, nondistended. No organomegaly noted, normoactive bowel sounds. Musculoskeletal: No edema, cyanosis, or clubbing. Neuro: Alert, answering all questions  appropriately. Cranial nerves grossly intact. Skin: No rashes or petechiae noted. Psych: Normal affect.   LAB RESULTS:  Lab Results  Component Value Date   NA 136 10/25/2016   K 3.1 (L) 10/25/2016   CL 99 (L) 10/25/2016   CO2 29 10/25/2016   GLUCOSE 117 (H) 10/25/2016   BUN 18 10/25/2016   CREATININE 1.11 (H) 10/25/2016   CALCIUM 9.4 10/25/2016   PROT 6.4 (L) 10/25/2016   ALBUMIN 3.8 10/25/2016   AST 28 10/25/2016   ALT 22 10/25/2016   ALKPHOS 49 10/25/2016   BILITOT 0.4 10/25/2016   GFRNONAA 46 (L) 10/25/2016   GFRAA 53 (L) 10/25/2016    Lab Results  Component Value Date   WBC 3.2 (L) 10/25/2016   NEUTROABS 2.5 10/25/2016   HGB 8.6 (L) 10/25/2016   HCT 25.1 (L) 10/25/2016   MCV 89.6 10/25/2016   PLT 305 10/25/2016     STUDIES: Mr Brain Wo Contrast  Result Date: 09/29/2016 CLINICAL DATA:  Syncope and collapse. LEFT-sided headache, dizziness, and blurred vision. EXAM: MRI HEAD WITHOUT CONTRAST TECHNIQUE: Multiplanar, multiecho pulse sequences of the brain and surrounding structures were obtained without intravenous contrast. COMPARISON:  CT head 08/06/2016.  MR head 10/11/2013. FINDINGS: Brain: No acute stroke, acute hemorrhage, mass lesion, or extra-axial fluid.  Global atrophy, with hydrocephalus ex vacuo. Chronic microvascular ischemic change. Tiny focus of chronic hemorrhage, LEFT frontal subcortical white matter, was acute in May. Additional tiny focus of chronic hemorrhage is seen in the RIGHT posterior temporal cortex (series 11, image 14). Neither of these areas were present on the prior MR. Vascular: Flow voids are maintained. Skull and upper cervical spine: Unremarkable marrow signal within limits for visualization. Previous anterior cervical fusion. Sinuses/Orbits: BILATERAL cataract extraction. No significant fluid accumulation or layering fluid in the paranasal sinuses. Other: None. IMPRESSION: Atrophy and small vessel disease.  No acute intracranial findings. Tiny foci of chronic hemorrhage, likely microbleed sequelae of hypertensive cerebrovascular disease. Electronically Signed   By: Staci Righter M.D.   On: 09/29/2016 16:11    ASSESSMENT: Clinical stage Ib ER/PR positive, HER-2 negative invasive carcinoma of the upper outer quadrant of the left breast.  PLAN:    1. Clinical stage Ib ER/PR positive, HER-2 negative invasive carcinoma of the upper outer quadrant of the left breast: Given the size of patient's breast mass at 2.7 cm, have recommended neoadjuvant chemotherapy using Adriamycin, Cytoxan, and Taxol. Patient completed 4 cycles of Adriamycin and Cytoxan on October 14, 2016. Pretreatment MUGA scan revealed an EF of 69%. Once she completes neoadjuvant chemotherapy, will refer back to surgery for possible lumpectomy. Patient will also require adjuvant XRT. Finally, given the ER/PR status of her tumor she will benefit from an aromatase inhibitor for 5 years. Proceed with cycle 2 of 12 of weekly Taxol today. Return to clinic in 1 week for lab and Taxol only and then in 2 weeks for further evaluation and consideration of cycle 4. 2. Neutropenia: Resolved. Proceed with treatment as above. 3. Mouth sores: Improved. Likely secondary to neutropenia.  Continue Magic mouthwash as needed. 4. Subdural hematoma: Secondary to fall. Monitor. 5. Anemia: Mild, monitor. 6. Renal insufficiency:  Mild, monitor.  Patient expressed understanding and was in agreement with this plan. She also understands that She can call clinic at any time with any questions, concerns, or complaints.   Cancer Staging Primary cancer of upper outer quadrant of left female breast [facility]) Staging form: Breast, AJCC 8th Edition - Clinical stage from 07/28/2016: Stage IB (  cT2, cN0, cM0, G2, ER: Positive, PR: Positive, HER2: Negative) - Signed by Lloyd Huger, MD on 07/28/2016   Lloyd Huger, MD   10/28/2016 8:58 AM

## 2016-10-25 ENCOUNTER — Inpatient Hospital Stay (HOSPITAL_BASED_OUTPATIENT_CLINIC_OR_DEPARTMENT_OTHER): Payer: PPO | Admitting: Oncology

## 2016-10-25 ENCOUNTER — Inpatient Hospital Stay: Payer: PPO

## 2016-10-25 VITALS — BP 147/71 | HR 94 | Temp 97.6°F | Resp 18 | Wt 155.9 lb

## 2016-10-25 DIAGNOSIS — I739 Peripheral vascular disease, unspecified: Secondary | ICD-10-CM

## 2016-10-25 DIAGNOSIS — I6200 Nontraumatic subdural hemorrhage, unspecified: Secondary | ICD-10-CM

## 2016-10-25 DIAGNOSIS — I10 Essential (primary) hypertension: Secondary | ICD-10-CM | POA: Diagnosis not present

## 2016-10-25 DIAGNOSIS — K219 Gastro-esophageal reflux disease without esophagitis: Secondary | ICD-10-CM

## 2016-10-25 DIAGNOSIS — C50412 Malignant neoplasm of upper-outer quadrant of left female breast: Secondary | ICD-10-CM | POA: Diagnosis not present

## 2016-10-25 DIAGNOSIS — Z170 Estrogen receptor positive status [ER+]: Secondary | ICD-10-CM | POA: Diagnosis not present

## 2016-10-25 DIAGNOSIS — N289 Disorder of kidney and ureter, unspecified: Secondary | ICD-10-CM

## 2016-10-25 DIAGNOSIS — R5383 Other fatigue: Secondary | ICD-10-CM

## 2016-10-25 DIAGNOSIS — G919 Hydrocephalus, unspecified: Secondary | ICD-10-CM

## 2016-10-25 DIAGNOSIS — M129 Arthropathy, unspecified: Secondary | ICD-10-CM | POA: Diagnosis not present

## 2016-10-25 DIAGNOSIS — G629 Polyneuropathy, unspecified: Secondary | ICD-10-CM | POA: Diagnosis not present

## 2016-10-25 DIAGNOSIS — D649 Anemia, unspecified: Secondary | ICD-10-CM

## 2016-10-25 DIAGNOSIS — Z79899 Other long term (current) drug therapy: Secondary | ICD-10-CM

## 2016-10-25 DIAGNOSIS — E785 Hyperlipidemia, unspecified: Secondary | ICD-10-CM

## 2016-10-25 DIAGNOSIS — Z7982 Long term (current) use of aspirin: Secondary | ICD-10-CM

## 2016-10-25 DIAGNOSIS — R55 Syncope and collapse: Secondary | ICD-10-CM

## 2016-10-25 DIAGNOSIS — F419 Anxiety disorder, unspecified: Secondary | ICD-10-CM | POA: Diagnosis not present

## 2016-10-25 DIAGNOSIS — Z803 Family history of malignant neoplasm of breast: Secondary | ICD-10-CM

## 2016-10-25 DIAGNOSIS — Z5111 Encounter for antineoplastic chemotherapy: Secondary | ICD-10-CM | POA: Diagnosis not present

## 2016-10-25 LAB — CBC WITH DIFFERENTIAL/PLATELET
Basophils Absolute: 0.1 10*3/uL (ref 0–0.1)
Basophils Relative: 3 %
EOS ABS: 0 10*3/uL (ref 0–0.7)
Eosinophils Relative: 0 %
HCT: 25.1 % — ABNORMAL LOW (ref 35.0–47.0)
HEMOGLOBIN: 8.6 g/dL — AB (ref 12.0–16.0)
Lymphocytes Relative: 15 %
Lymphs Abs: 0.5 10*3/uL — ABNORMAL LOW (ref 1.0–3.6)
MCH: 30.6 pg (ref 26.0–34.0)
MCHC: 34.2 g/dL (ref 32.0–36.0)
MCV: 89.6 fL (ref 80.0–100.0)
MONOS PCT: 6 %
Monocytes Absolute: 0.2 10*3/uL (ref 0.2–0.9)
NEUTROS PCT: 76 %
Neutro Abs: 2.5 10*3/uL (ref 1.4–6.5)
Platelets: 305 10*3/uL (ref 150–440)
RBC: 2.8 MIL/uL — ABNORMAL LOW (ref 3.80–5.20)
RDW: 22 % — ABNORMAL HIGH (ref 11.5–14.5)
WBC: 3.2 10*3/uL — ABNORMAL LOW (ref 3.6–11.0)

## 2016-10-25 LAB — COMPREHENSIVE METABOLIC PANEL
ALK PHOS: 49 U/L (ref 38–126)
ALT: 22 U/L (ref 14–54)
ANION GAP: 8 (ref 5–15)
AST: 28 U/L (ref 15–41)
Albumin: 3.8 g/dL (ref 3.5–5.0)
BILIRUBIN TOTAL: 0.4 mg/dL (ref 0.3–1.2)
BUN: 18 mg/dL (ref 6–20)
CALCIUM: 9.4 mg/dL (ref 8.9–10.3)
CO2: 29 mmol/L (ref 22–32)
Chloride: 99 mmol/L — ABNORMAL LOW (ref 101–111)
Creatinine, Ser: 1.11 mg/dL — ABNORMAL HIGH (ref 0.44–1.00)
GFR, EST AFRICAN AMERICAN: 53 mL/min — AB (ref >60)
GFR, EST NON AFRICAN AMERICAN: 46 mL/min — AB (ref >60)
Glucose, Bld: 117 mg/dL — ABNORMAL HIGH (ref 65–99)
Potassium: 3.1 mmol/L — ABNORMAL LOW (ref 3.5–5.1)
SODIUM: 136 mmol/L (ref 135–145)
TOTAL PROTEIN: 6.4 g/dL — AB (ref 6.5–8.1)

## 2016-10-25 MED ORDER — SODIUM CHLORIDE 0.9 % IV SOLN
Freq: Once | INTRAVENOUS | Status: AC
  Administered 2016-10-25: 12:00:00 via INTRAVENOUS
  Filled 2016-10-25: qty 1000

## 2016-10-25 MED ORDER — HEPARIN SOD (PORK) LOCK FLUSH 100 UNIT/ML IV SOLN
500.0000 [IU] | Freq: Once | INTRAVENOUS | Status: AC
  Administered 2016-10-25: 500 [IU] via INTRAVENOUS
  Filled 2016-10-25: qty 5

## 2016-10-25 MED ORDER — SODIUM CHLORIDE 0.9 % IV SOLN
80.0000 mg/m2 | Freq: Once | INTRAVENOUS | Status: AC
  Administered 2016-10-25: 156 mg via INTRAVENOUS
  Filled 2016-10-25: qty 26

## 2016-10-25 MED ORDER — DEXAMETHASONE SODIUM PHOSPHATE 10 MG/ML IJ SOLN
10.0000 mg | Freq: Once | INTRAMUSCULAR | Status: AC
  Administered 2016-10-25: 10 mg via INTRAVENOUS
  Filled 2016-10-25: qty 1

## 2016-10-25 MED ORDER — DIPHENHYDRAMINE HCL 50 MG/ML IJ SOLN
25.0000 mg | Freq: Once | INTRAMUSCULAR | Status: AC
  Administered 2016-10-25: 25 mg via INTRAVENOUS
  Filled 2016-10-25: qty 1

## 2016-10-25 MED ORDER — FAMOTIDINE IN NACL 20-0.9 MG/50ML-% IV SOLN
20.0000 mg | Freq: Once | INTRAVENOUS | Status: AC
  Administered 2016-10-25: 20 mg via INTRAVENOUS
  Filled 2016-10-25: qty 50

## 2016-10-25 MED ORDER — SODIUM CHLORIDE 0.9% FLUSH
10.0000 mL | INTRAVENOUS | Status: DC | PRN
  Administered 2016-10-25: 10 mL via INTRAVENOUS
  Filled 2016-10-25: qty 10

## 2016-11-01 ENCOUNTER — Inpatient Hospital Stay: Payer: PPO

## 2016-11-01 ENCOUNTER — Inpatient Hospital Stay (HOSPITAL_BASED_OUTPATIENT_CLINIC_OR_DEPARTMENT_OTHER): Payer: PPO | Admitting: Oncology

## 2016-11-01 VITALS — BP 144/72 | HR 102 | Temp 97.2°F | Resp 18 | Wt 152.0 lb

## 2016-11-01 DIAGNOSIS — Z79899 Other long term (current) drug therapy: Secondary | ICD-10-CM

## 2016-11-01 DIAGNOSIS — G919 Hydrocephalus, unspecified: Secondary | ICD-10-CM

## 2016-11-01 DIAGNOSIS — E785 Hyperlipidemia, unspecified: Secondary | ICD-10-CM

## 2016-11-01 DIAGNOSIS — C50412 Malignant neoplasm of upper-outer quadrant of left female breast: Secondary | ICD-10-CM

## 2016-11-01 DIAGNOSIS — R110 Nausea: Secondary | ICD-10-CM | POA: Diagnosis not present

## 2016-11-01 DIAGNOSIS — G629 Polyneuropathy, unspecified: Secondary | ICD-10-CM | POA: Diagnosis not present

## 2016-11-01 DIAGNOSIS — Z170 Estrogen receptor positive status [ER+]: Secondary | ICD-10-CM

## 2016-11-01 DIAGNOSIS — M129 Arthropathy, unspecified: Secondary | ICD-10-CM | POA: Diagnosis not present

## 2016-11-01 DIAGNOSIS — E860 Dehydration: Secondary | ICD-10-CM | POA: Diagnosis not present

## 2016-11-01 DIAGNOSIS — N289 Disorder of kidney and ureter, unspecified: Secondary | ICD-10-CM

## 2016-11-01 DIAGNOSIS — I739 Peripheral vascular disease, unspecified: Secondary | ICD-10-CM

## 2016-11-01 DIAGNOSIS — D649 Anemia, unspecified: Secondary | ICD-10-CM | POA: Diagnosis not present

## 2016-11-01 DIAGNOSIS — I10 Essential (primary) hypertension: Secondary | ICD-10-CM

## 2016-11-01 DIAGNOSIS — R5383 Other fatigue: Secondary | ICD-10-CM | POA: Diagnosis not present

## 2016-11-01 DIAGNOSIS — F419 Anxiety disorder, unspecified: Secondary | ICD-10-CM

## 2016-11-01 DIAGNOSIS — I6200 Nontraumatic subdural hemorrhage, unspecified: Secondary | ICD-10-CM | POA: Diagnosis not present

## 2016-11-01 DIAGNOSIS — R55 Syncope and collapse: Secondary | ICD-10-CM

## 2016-11-01 DIAGNOSIS — E876 Hypokalemia: Secondary | ICD-10-CM

## 2016-11-01 DIAGNOSIS — Z803 Family history of malignant neoplasm of breast: Secondary | ICD-10-CM

## 2016-11-01 DIAGNOSIS — K219 Gastro-esophageal reflux disease without esophagitis: Secondary | ICD-10-CM

## 2016-11-01 DIAGNOSIS — Z5111 Encounter for antineoplastic chemotherapy: Secondary | ICD-10-CM | POA: Diagnosis not present

## 2016-11-01 DIAGNOSIS — Z7982 Long term (current) use of aspirin: Secondary | ICD-10-CM

## 2016-11-01 LAB — CBC WITH DIFFERENTIAL/PLATELET
BASOS ABS: 0 10*3/uL (ref 0–0.1)
Basophils Relative: 1 %
EOS PCT: 1 %
Eosinophils Absolute: 0 10*3/uL (ref 0–0.7)
HEMATOCRIT: 25 % — AB (ref 35.0–47.0)
Hemoglobin: 8.5 g/dL — ABNORMAL LOW (ref 12.0–16.0)
LYMPHS ABS: 0.5 10*3/uL — AB (ref 1.0–3.6)
LYMPHS PCT: 29 %
MCH: 30.8 pg (ref 26.0–34.0)
MCHC: 34.1 g/dL (ref 32.0–36.0)
MCV: 90.5 fL (ref 80.0–100.0)
MONO ABS: 0.2 10*3/uL (ref 0.2–0.9)
MONOS PCT: 9 %
NEUTROS ABS: 1 10*3/uL — AB (ref 1.4–6.5)
Neutrophils Relative %: 60 %
Platelets: 185 10*3/uL (ref 150–440)
RBC: 2.76 MIL/uL — ABNORMAL LOW (ref 3.80–5.20)
RDW: 22.5 % — AB (ref 11.5–14.5)
WBC: 1.7 10*3/uL — ABNORMAL LOW (ref 3.6–11.0)

## 2016-11-01 LAB — COMPREHENSIVE METABOLIC PANEL
ALT: 35 U/L (ref 14–54)
AST: 42 U/L — AB (ref 15–41)
Albumin: 3.8 g/dL (ref 3.5–5.0)
Alkaline Phosphatase: 43 U/L (ref 38–126)
Anion gap: 8 (ref 5–15)
BILIRUBIN TOTAL: 0.7 mg/dL (ref 0.3–1.2)
BUN: 21 mg/dL — AB (ref 6–20)
CO2: 30 mmol/L (ref 22–32)
CREATININE: 1.16 mg/dL — AB (ref 0.44–1.00)
Calcium: 9.4 mg/dL (ref 8.9–10.3)
Chloride: 99 mmol/L — ABNORMAL LOW (ref 101–111)
GFR, EST AFRICAN AMERICAN: 51 mL/min — AB (ref >60)
GFR, EST NON AFRICAN AMERICAN: 44 mL/min — AB (ref >60)
Glucose, Bld: 108 mg/dL — ABNORMAL HIGH (ref 65–99)
POTASSIUM: 3.2 mmol/L — AB (ref 3.5–5.1)
Sodium: 137 mmol/L (ref 135–145)
TOTAL PROTEIN: 6.2 g/dL — AB (ref 6.5–8.1)

## 2016-11-01 MED ORDER — SODIUM CHLORIDE 0.9 % IV SOLN
Freq: Once | INTRAVENOUS | Status: AC
  Administered 2016-11-01: 15:00:00 via INTRAVENOUS
  Filled 2016-11-01: qty 4

## 2016-11-01 MED ORDER — HEPARIN SOD (PORK) LOCK FLUSH 100 UNIT/ML IV SOLN
500.0000 [IU] | Freq: Once | INTRAVENOUS | Status: AC
  Administered 2016-11-01: 500 [IU] via INTRAVENOUS

## 2016-11-01 MED ORDER — SODIUM CHLORIDE 0.9 % IV SOLN
INTRAVENOUS | Status: DC
  Administered 2016-11-01: 15:00:00 via INTRAVENOUS
  Filled 2016-11-01 (×2): qty 1000

## 2016-11-01 MED ORDER — HEPARIN SOD (PORK) LOCK FLUSH 100 UNIT/ML IV SOLN
INTRAVENOUS | Status: AC
  Filled 2016-11-01: qty 5

## 2016-11-01 NOTE — Progress Notes (Signed)
[place]  [phone]) [phone] [phone]) [phone]  ID: Sandra Fritz OB: Apr 07, 1934  MR#: [id number]  ZRA#:[id number]  Patient Care Team: Jerrol Banana., MD as PCP - General (Unknown Physician Specialty) Jerrol Banana., MD (Family Medicine) Bary Castilla Forest Gleason, MD (General Surgery)  CHIEF COMPLAINT: Clinical stage Ib ER/PR positive, HER-2 negative invasive carcinoma of the upper outer quadrant of the left breast.  INTERVAL HISTORY: Patient returns to clinic today for further evaluation and consideration of cycle 2 or 12 of Taxol. She finished 4 cycles of AC on July 17th, 2018. Today she complains of worsening fatigue. She is unable to walk around her house without assistance. She has no appetite. She has some nausea but no vomiting. She states her chemotherapy last week "through her for a loop". She would like to skip treatment today to regain her strength. Her mouth pain has resolved. She continues to complain of tingling/numbness in bilateral lower extremity but this is no worse than last week. She denies any recent fevers or illnesses. She has no chest pain or shortness of breath. She denies any nausea, vomiting, constipation, or diarrhea. She has no urinary complaints. Patient offers no further specific complaints.  REVIEW OF SYSTEMS:   Review of Systems  Constitutional: Positive for malaise/fatigue and weight loss. Negative for fever.  HENT: Negative for sore throat.   Eyes: Negative for blurred vision, double vision and pain.  Respiratory: Negative.  Negative for cough and shortness of breath.   Cardiovascular: Negative.  Negative for chest pain and leg swelling.  Gastrointestinal: Negative.  Negative for abdominal pain.  Genitourinary: Negative.   Musculoskeletal: Negative.   Skin: Negative.  Negative for rash.  Neurological: Positive for sensory change and weakness. Negative for headaches.       Peripheral neuropathy not any worse.     Psychiatric/Behavioral: Negative.  The patient is not nervous/anxious.     As per HPI. Otherwise, a complete review of systems is negative.  PAST MEDICAL HISTORY: Past Medical History:  Diagnosis Date  . Anemia   . Anxiety   . Arthritis   . Blood transfusion without reported diagnosis   . Cataract   . Complication of anesthesia    HARD TO WAKE UP AFTER TONSILLECTOMY  . GERD (gastroesophageal reflux disease)   . Hyperlipidemia   . Hypertension   . Neuromuscular disorder ([place])   . Primary cancer of upper outer quadrant of left female breast ([place]) 07/28/2016    PAST SURGICAL HISTORY: Past Surgical History:  Procedure Laterality Date  . APPENDECTOMY  1955   SECONDARY TO RUTURED APPENDIX  . CATARACT EXTRACTION, BILATERAL  2011  . COLONOSCOPY  2002  . DILATION AND CURETTAGE OF UTERUS  1989   Dr. Laurey Morale  . JOINT REPLACEMENT     Right hip  . MOLE REMOVAL  1957   18 removed by Dr. Hoyle Sauer  . NECK SURGERY    . PORTACATH PLACEMENT Right 08/05/2016   Procedure: INSERTION PORT-A-CATH WITH ULTRASOUND;  Surgeon: Robert Bellow, MD;  Location: [facility] ORS;  Service: General;  Laterality: Right;  . TONSILLECTOMY  1955    FAMILY HISTORY: Family History  Problem Relation Age of Onset  . Macular degeneration Mother   . Hypertension Mother   . Diabetes Father   . Heart disease Father   . Heart attack Father 19  . Heart disease Brother 77  . Dermatomyositis Sister   . Cancer Sister        Carcinoma of  the uterine call attached to colon and is in remission now  . Breast cancer Maternal Aunt 70  . Breast cancer Paternal Aunt 5  . Breast cancer Cousin        paternal aunt w/ breast ca daughter  . Breast cancer Maternal Aunt 60    ADVANCED DIRECTIVES (Y/N):  N  HEALTH MAINTENANCE: Social History  Substance Use Topics  . Smoking status: Never Smoker  . Smokeless tobacco: Never Used  . Alcohol use No     Comment: Maybe 3 per year     Colonoscopy:  PAP:  Bone  density:  Lipid panel:  Allergies  Allergen Reactions  . Keflex  [Cephalexin]     hives  . Statins     Muscle and joint pain-patient states she tried all statins.  . Erythromycin Rash    ALL MYCINS    Current Outpatient Prescriptions  Medication Sig Dispense Refill  . ALPRAZolam (XANAX) 0.25 MG tablet Take 1 tablet (0.25 mg total) by mouth at bedtime as needed for anxiety. 30 tablet 0  . aspirin 81 MG tablet Take 81 mg by mouth daily.     Marland Kitchen esomeprazole (NEXIUM) 20 MG capsule Take 1 capsule (20 mg total) by mouth daily. 90 capsule 3  . furosemide (LASIX) 20 MG tablet Take 1 tablet (20 mg total) by mouth daily. 30 tablet 0  . lidocaine-prilocaine (EMLA) cream Apply to affected area once 30 g 3  . loratadine (CLARITIN) 10 MG tablet Take 10 mg by mouth daily as needed.     Marland Kitchen losartan (COZAAR) 100 MG tablet Take 1 tablet (100 mg total) by mouth daily. 90 tablet 3  . magic mouthwash w/lidocaine SOLN Take 5 mLs by mouth 4 (four) times daily as needed for mouth pain. 240 mL 0  . Misc Natural Products (OSTEO BI-FLEX JOINT SHIELD PO) Take 2 tablets by mouth daily.    . Multiple Vitamin (MULTIVITAMIN WITH MINERALS) TABS tablet Take 1 tablet by mouth 3 (three) times a week.    . ondansetron (ZOFRAN) 8 MG tablet Take 1 tablet (8 mg total) by mouth 2 (two) times daily as needed. Start on the third day after chemotherapy. 60 tablet 2  . prochlorperazine (COMPAZINE) 10 MG tablet Take 1 tablet (10 mg total) by mouth every 6 (six) hours as needed (Nausea or vomiting). 60 tablet 2  . rosuvastatin (CRESTOR) 5 MG tablet Take 1 tablet (5 mg total) by mouth every other day. 45 tablet 3  . traMADol (ULTRAM) 50 MG tablet Take 1 tablet (50 mg total) by mouth 2 (two) times daily as needed. 60 tablet 5  . clobetasol cream (TEMOVATE) 7.40 % Apply 1 application topically 2 (two) times daily. ON ARMS    . fluticasone (FLONASE) 50 MCG/ACT nasal spray Place 1 spray into both nostrils daily. (Patient not taking:  Reported on 11/01/2016) 16 g 2  . hydrocortisone 2.5 % cream Apply 1 application topically 2 (two) times daily. ON FACE    . sucralfate (CARAFATE) 1 g tablet Take 1 tablet (1 g total) by mouth 4 (four) times daily -  with meals and at bedtime. (Patient not taking: Reported on 10/25/2016) 120 tablet 12   Current Facility-Administered Medications  Medication Dose Route Frequency Provider Last Rate Last Dose  . sodium chloride 0.9 % 1,000 mL infusion   Intravenous Continuous Jacquelin Hawking, NP       Facility-Administered Medications Ordered in Other Visits  Medication Dose Route Frequency Provider Last Rate Last  Dose  . ondansetron (ZOFRAN) 8 mg, dexamethasone (DECADRON) 10 mg in sodium chloride 0.9 % 50 mL IVPB   Intravenous Once Jacquelin Hawking, NP        OBJECTIVE: Vitals:   11/01/16 1427  BP: (!) 144/72  Pulse: (!) 102  Resp: 18  Temp: (!) 97.2 F (36.2 C)     Body mass index is 22.45 kg/m.    ECOG FS:0 - Asymptomatic  General: Well-developed, well-nourished, no acute distress. Eyes: Pink conjunctiva, anicteric sclera.   Breasts: Palpable left breast mass. Exam deferred today. HEENT: Clear oropharynx with only mild erythema. Lungs: Clear to auscultation bilaterally. Heart: Regular rate and rhythm. No rubs, murmurs, or gallops. Abdomen: Soft, nontender, nondistended. No organomegaly noted, normoactive bowel sounds. Musculoskeletal: No edema, cyanosis, or clubbing. Neuro: Alert, answering all questions appropriately. Cranial nerves grossly intact. Skin: No rashes or petechiae noted. Psych: Normal affect.   LAB RESULTS:  Lab Results  Component Value Date   NA 137 11/01/2016   K 3.2 (L) 11/01/2016   CL 99 (L) 11/01/2016   CO2 30 11/01/2016   GLUCOSE 108 (H) 11/01/2016   BUN 21 (H) 11/01/2016   CREATININE 1.16 (H) 11/01/2016   CALCIUM 9.4 11/01/2016   PROT 6.2 (L) 11/01/2016   ALBUMIN 3.8 11/01/2016   AST 42 (H) 11/01/2016   ALT 35 11/01/2016   ALKPHOS 43 11/01/2016    BILITOT 0.7 11/01/2016   GFRNONAA 44 (L) 11/01/2016   GFRAA 51 (L) 11/01/2016    Lab Results  Component Value Date   WBC 1.7 (L) 11/01/2016   NEUTROABS 1.0 (L) 11/01/2016   HGB 8.5 (L) 11/01/2016   HCT 25.0 (L) 11/01/2016   MCV 90.5 11/01/2016   PLT 185 11/01/2016     STUDIES: No results found.  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  PLAN:    1. Clinical stage Ib ER/PR positive, HER-2 negative invasive carcinoma of the upper outer quadrant of the left breast: Given the size of patient's breast mass at 2.7 cm, have recommended neoadjuvant chemotherapy using Adriamycin, Cytoxan, and Taxol. She will also require Neulasta support. Pretreatment MUGA scan revealed an EF of 69%. Once she completes neoadjuvant chemotherapy, will refer back to surgery for possible lumpectomy. Patient will also require adjuvant XRT. Finally, given the ER/PR status of her tumor she will benefit from an aromatase inhibitor for 5 years. Since the patient presents today with severe weakness and nausea, will hold cycle 2 Taxol today. She will receive 1 L normal saline with IV Zofran and Decadron. She will return next week for reconsideration of cycle 2 Taxol/labs/MD. 2. Neutropenia: WBC 1.7. ANC 1. Treatment will be held today. 3. Mouth sores: Improved. Likely secondary to neutropenia. Continue Magic mouthwash PRN. 4. Anemia: Mild, monitor. Hemoglobin 8.5 today.  5. Renal insufficiency:  Mild, stable, monitor. 6. Neuropathy: Better from last week.  7. Hypokalemia: Monitor.  8. Weakness/Dehydration: IV fluids as above.  9. Nausea: IV antiemetics and IV decadron as above.   Patient expressed understanding and was in agreement with this plan. She also understands that She can call clinic at any time with any questions, concerns, or complaints.   Cancer Staging Primary cancer of upper outer quadrant of left female breast  [facility]) Staging form: Breast, AJCC 8th Edition - Clinical stage from 07/28/2016: Stage IB (cT2, cN0, cM0, G2, ER: Positive, PR: Positive, HER2: Negative) - Signed by Lloyd Huger, MD on  07/28/2016   Jacquelin Hawking, NP   11/01/2016 2:57 PM

## 2016-11-01 NOTE — Progress Notes (Signed)
Patient is here today for add on. She reports since her last chemo treatment she has not been feeling good. She is very weak. She has not been eating or drinking.

## 2016-11-07 NOTE — Progress Notes (Signed)
[place]  Telephone:(336) [phone] Fax:(336) [phone]  ID: JANKI DIKE OB: 05/01/1934  MR#: [id number]  HFW#:[id number]  Patient Care Team: Jerrol Banana., MD as PCP - General (Unknown Physician Specialty) Jerrol Banana., MD (Family Medicine) Bary Castilla Forest Gleason, MD (General Surgery)  CHIEF COMPLAINT: Clinical stage Ib ER/PR positive, HER-2 negative invasive carcinoma of the upper outer quadrant of the left breast.  INTERVAL HISTORY: Patient returns to clinic today for further evaluation and reconsideration of cycle 3 of 12 of weekly Taxol. She has increased fatigue, but this is improving. She has a poor appetite secondary to taste and continues to have occasional nausea. She has bilateral leg cramping. She also has bilateral lower extremity edema.  She denies any recent fevers or illnesses. She has no chest pain or shortness of breath. She denies any vomiting, constipation, or diarrhea. She has no urinary complaints. Patient offers no further specific complaints.  REVIEW OF SYSTEMS:   Review of Systems  Constitutional: Positive for malaise/fatigue and weight loss. Negative for fever.  HENT: Negative for sore throat.   Eyes: Negative for blurred vision, double vision and pain.  Respiratory: Negative.  Negative for cough and shortness of breath.   Cardiovascular: Negative.  Negative for chest pain and leg swelling.  Gastrointestinal: Negative.  Negative for abdominal pain.  Genitourinary: Negative.   Musculoskeletal: Negative.   Skin: Negative.  Negative for rash.  Neurological: Positive for tingling, sensory change and weakness. Negative for headaches.  Psychiatric/Behavioral: Negative.  The patient is not nervous/anxious.     As per HPI. Otherwise, a complete review of systems is negative.  PAST MEDICAL HISTORY: Past Medical History:  Diagnosis Date  . Anemia   . Anxiety   . Arthritis   . Blood transfusion without reported diagnosis     . Cataract   . Complication of anesthesia    HARD TO WAKE UP AFTER TONSILLECTOMY  . GERD (gastroesophageal reflux disease)   . Hyperlipidemia   . Hypertension   . Neuromuscular disorder ([place])   . Primary cancer of upper outer quadrant of left female breast ([place]) 07/28/2016    PAST SURGICAL HISTORY: Past Surgical History:  Procedure Laterality Date  . APPENDECTOMY  1955   SECONDARY TO RUTURED APPENDIX  . CATARACT EXTRACTION, BILATERAL  2011  . COLONOSCOPY  2002  . DILATION AND CURETTAGE OF UTERUS  1989   Dr. Laurey Morale  . JOINT REPLACEMENT     Right hip  . MOLE REMOVAL  1957   18 removed by Dr. Hoyle Sauer  . NECK SURGERY    . PORTACATH PLACEMENT Right 08/05/2016   Procedure: INSERTION PORT-A-CATH WITH ULTRASOUND;  Surgeon: Robert Bellow, MD;  Location: [facility] ORS;  Service: General;  Laterality: Right;  . TONSILLECTOMY  1955    FAMILY HISTORY: Family History  Problem Relation Age of Onset  . Macular degeneration Mother   . Hypertension Mother   . Diabetes Father   . Heart disease Father   . Heart attack Father 102  . Heart disease Brother 28  . Dermatomyositis Sister   . Cancer Sister        Carcinoma of the uterine call attached to colon and is in remission now  . Breast cancer Maternal Aunt 70  . Breast cancer Paternal Aunt 81  . Breast cancer Cousin        paternal aunt w/ breast ca daughter  . Breast cancer Maternal Aunt 92    ADVANCED DIRECTIVES (Y/N):  N  HEALTH MAINTENANCE: Social History  Substance Use Topics  . Smoking status: Never Smoker  . Smokeless tobacco: Never Used  . Alcohol use 0.6 oz/week    1 Glasses of wine per week     Comment: Maybe 3 per year     Colonoscopy:  PAP:  Bone density:  Lipid panel:  Allergies  Allergen Reactions  . Keflex  [Cephalexin]     hives  . Statins     Muscle and joint pain-patient states she tried all statins.  . Erythromycin Rash    ALL MYCINS    Current Outpatient Prescriptions  Medication Sig  Dispense Refill  . ALPRAZolam (XANAX) 0.25 MG tablet Take 1 tablet (0.25 mg total) by mouth at bedtime as needed for anxiety. 30 tablet 0  . aspirin 81 MG tablet Take 81 mg by mouth daily.     Marland Kitchen esomeprazole (NEXIUM) 20 MG capsule Take 1 capsule (20 mg total) by mouth daily. 90 capsule 3  . fluticasone (FLONASE) 50 MCG/ACT nasal spray Place 1 spray into both nostrils daily. (Patient taking differently: Place 1 spray into both nostrils daily as needed. ) 16 g 2  . furosemide (LASIX) 20 MG tablet Take 1 tablet (20 mg total) by mouth daily. 30 tablet 0  . hydrocortisone 2.5 % cream Apply 1 application topically 2 (two) times daily. ON FACE    . lidocaine-prilocaine (EMLA) cream Apply to affected area once 30 g 3  . loratadine (CLARITIN) 10 MG tablet Take 10 mg by mouth daily as needed.     Marland Kitchen losartan (COZAAR) 100 MG tablet Take 1 tablet (100 mg total) by mouth daily. 90 tablet 3  . magic mouthwash w/lidocaine SOLN Take 5 mLs by mouth 4 (four) times daily as needed for mouth pain. 240 mL 0  . ondansetron (ZOFRAN) 8 MG tablet Take 1 tablet (8 mg total) by mouth 2 (two) times daily as needed. Start on the third day after chemotherapy. 60 tablet 2  . prochlorperazine (COMPAZINE) 10 MG tablet Take 1 tablet (10 mg total) by mouth every 6 (six) hours as needed (Nausea or vomiting). 60 tablet 2  . rosuvastatin (CRESTOR) 5 MG tablet Take 1 tablet (5 mg total) by mouth every other day. 45 tablet 3  . traMADol (ULTRAM) 50 MG tablet Take 1 tablet (50 mg total) by mouth 2 (two) times daily as needed. 60 tablet 5  . Misc Natural Products (OSTEO BI-FLEX JOINT SHIELD PO) Take 2 tablets by mouth daily.    . Multiple Vitamin (MULTIVITAMIN WITH MINERALS) TABS tablet Take 1 tablet by mouth 3 (three) times a week.     Current Facility-Administered Medications  Medication Dose Route Frequency Provider Last Rate Last Dose  . sodium chloride 0.9 % 1,000 mL infusion   Intravenous Continuous Jacquelin Hawking, NP 999 mL/hr  at 11/01/16 1500      OBJECTIVE: Vitals:   11/08/16 1052  BP: (!) 166/85  Pulse: 92  Resp: 18  Temp: (!) 97.5 F (36.4 C)     Body mass index is 22.61 kg/m.    ECOG FS:0 - Asymptomatic  General: Well-developed, well-nourished, no acute distress. Eyes: Pink conjunctiva, anicteric sclera.   Breasts: Palpable left breast mass. Exam deferred today. HEENT: Clear oropharynx with only mild erythema. Lungs: Clear to auscultation bilaterally. Heart: Regular rate and rhythm. No rubs, murmurs, or gallops. Abdomen: Soft, nontender, nondistended. No organomegaly noted, normoactive bowel sounds. Musculoskeletal: No edema, cyanosis, or clubbing. Neuro: Alert, answering all questions appropriately. Cranial  nerves grossly intact. Skin: No rashes or petechiae noted. Psych: Normal affect.   LAB RESULTS:  Lab Results  Component Value Date   NA 139 11/08/2016   K 3.0 (L) 11/08/2016   CL 100 (L) 11/08/2016   CO2 30 11/08/2016   GLUCOSE 105 (H) 11/08/2016   BUN 13 11/08/2016   CREATININE 1.08 (H) 11/08/2016   CALCIUM 9.4 11/08/2016   PROT 6.6 11/08/2016   ALBUMIN 3.7 11/08/2016   AST 29 11/08/2016   ALT 21 11/08/2016   ALKPHOS 45 11/08/2016   BILITOT 0.5 11/08/2016   GFRNONAA 48 (L) 11/08/2016   GFRAA 55 (L) 11/08/2016    Lab Results  Component Value Date   WBC 2.5 (L) 11/08/2016   NEUTROABS 1.4 11/08/2016   HGB 9.4 (L) 11/08/2016   HCT 27.4 (L) 11/08/2016   MCV 91.6 11/08/2016   PLT 219 11/08/2016     STUDIES: No results found.  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  PLAN:    1. Clinical stage Ib ER/PR positive, HER-2 negative invasive carcinoma of the upper outer quadrant of the left breast: Given the size of patient's breast mass at 2.7 cm, have recommended neoadjuvant chemotherapy using Adriamycin, Cytoxan, and Taxol. Patient completed 4 cycles of Adriamycin and Cytoxan on October 14, 2016. Pretreatment MUGA scan revealed an EF of 69%. Once she completes neoadjuvant chemotherapy, will refer back to surgery for possible lumpectomy. Patient will also require adjuvant XRT. Finally, given the ER/PR status of her tumor she will benefit from an aromatase inhibitor for 5 years. Proceed with cycle 3 of 12 of weekly Taxol today. Return to clinic in 1 week for further evaluation and consideration of cycle 4. 2. Neutropenia: Resolved. Proceed with treatment as above. 3. Mouth sores: Improved. Likely secondary to neutropenia. Continue Magic mouthwash as needed. 4. Subdural hematoma: Secondary to fall. Monitor. 5. Anemia: Mild, monitor. 6. Renal insufficiency:  Mild, monitor. 7. Cramping/sensory change: Unclear if related to Taxol. Monitor closely. 8. Edema: Patient was instructed to temporarily increase her daily Lasix dose.  Patient expressed understanding and was in agreement with this plan. She also understands that She can call clinic at any time with any questions, concerns, or complaints.   Cancer Staging Primary cancer of upper outer quadrant of left female breast [facility]) Staging form: Breast, AJCC 8th Edition - Clinical stage from 07/28/2016: Stage IB (cT2, cN0, cM0, G2, ER: Positive, PR: Positive, HER2: Negative) - Signed by Lloyd Huger, MD on 07/28/2016   Lloyd Huger, MD   11/10/2016 8:55 AM

## 2016-11-08 ENCOUNTER — Encounter: Payer: Self-pay | Admitting: Oncology

## 2016-11-08 ENCOUNTER — Inpatient Hospital Stay: Payer: PPO

## 2016-11-08 ENCOUNTER — Inpatient Hospital Stay (HOSPITAL_BASED_OUTPATIENT_CLINIC_OR_DEPARTMENT_OTHER): Payer: PPO | Admitting: Oncology

## 2016-11-08 VITALS — BP 166/85 | HR 92 | Temp 97.5°F | Resp 18 | Ht 69.0 in | Wt 153.1 lb

## 2016-11-08 DIAGNOSIS — R110 Nausea: Secondary | ICD-10-CM | POA: Diagnosis not present

## 2016-11-08 DIAGNOSIS — D649 Anemia, unspecified: Secondary | ICD-10-CM

## 2016-11-08 DIAGNOSIS — R5383 Other fatigue: Secondary | ICD-10-CM

## 2016-11-08 DIAGNOSIS — Z170 Estrogen receptor positive status [ER+]: Secondary | ICD-10-CM | POA: Diagnosis not present

## 2016-11-08 DIAGNOSIS — Z5111 Encounter for antineoplastic chemotherapy: Secondary | ICD-10-CM | POA: Diagnosis not present

## 2016-11-08 DIAGNOSIS — M129 Arthropathy, unspecified: Secondary | ICD-10-CM

## 2016-11-08 DIAGNOSIS — Z7982 Long term (current) use of aspirin: Secondary | ICD-10-CM

## 2016-11-08 DIAGNOSIS — N289 Disorder of kidney and ureter, unspecified: Secondary | ICD-10-CM | POA: Diagnosis not present

## 2016-11-08 DIAGNOSIS — F419 Anxiety disorder, unspecified: Secondary | ICD-10-CM | POA: Diagnosis not present

## 2016-11-08 DIAGNOSIS — I6200 Nontraumatic subdural hemorrhage, unspecified: Secondary | ICD-10-CM

## 2016-11-08 DIAGNOSIS — G629 Polyneuropathy, unspecified: Secondary | ICD-10-CM

## 2016-11-08 DIAGNOSIS — E785 Hyperlipidemia, unspecified: Secondary | ICD-10-CM

## 2016-11-08 DIAGNOSIS — C50412 Malignant neoplasm of upper-outer quadrant of left female breast: Secondary | ICD-10-CM | POA: Diagnosis not present

## 2016-11-08 DIAGNOSIS — I10 Essential (primary) hypertension: Secondary | ICD-10-CM

## 2016-11-08 DIAGNOSIS — R55 Syncope and collapse: Secondary | ICD-10-CM

## 2016-11-08 DIAGNOSIS — Z79899 Other long term (current) drug therapy: Secondary | ICD-10-CM

## 2016-11-08 DIAGNOSIS — I739 Peripheral vascular disease, unspecified: Secondary | ICD-10-CM

## 2016-11-08 DIAGNOSIS — Z803 Family history of malignant neoplasm of breast: Secondary | ICD-10-CM

## 2016-11-08 DIAGNOSIS — E860 Dehydration: Secondary | ICD-10-CM | POA: Diagnosis not present

## 2016-11-08 DIAGNOSIS — K219 Gastro-esophageal reflux disease without esophagitis: Secondary | ICD-10-CM

## 2016-11-08 DIAGNOSIS — E876 Hypokalemia: Secondary | ICD-10-CM | POA: Diagnosis not present

## 2016-11-08 DIAGNOSIS — G919 Hydrocephalus, unspecified: Secondary | ICD-10-CM

## 2016-11-08 LAB — CBC WITH DIFFERENTIAL/PLATELET
BASOS ABS: 0 10*3/uL (ref 0–0.1)
Basophils Relative: 1 %
Eosinophils Absolute: 0 10*3/uL (ref 0–0.7)
Eosinophils Relative: 1 %
HEMATOCRIT: 27.4 % — AB (ref 35.0–47.0)
Hemoglobin: 9.4 g/dL — ABNORMAL LOW (ref 12.0–16.0)
LYMPHS ABS: 0.5 10*3/uL — AB (ref 1.0–3.6)
LYMPHS PCT: 21 %
MCH: 31.3 pg (ref 26.0–34.0)
MCHC: 34.2 g/dL (ref 32.0–36.0)
MCV: 91.6 fL (ref 80.0–100.0)
MONO ABS: 0.5 10*3/uL (ref 0.2–0.9)
MONOS PCT: 20 %
NEUTROS ABS: 1.4 10*3/uL (ref 1.4–6.5)
Neutrophils Relative %: 57 %
Platelets: 219 10*3/uL (ref 150–440)
RBC: 2.99 MIL/uL — ABNORMAL LOW (ref 3.80–5.20)
RDW: 23.1 % — AB (ref 11.5–14.5)
WBC: 2.5 10*3/uL — ABNORMAL LOW (ref 3.6–11.0)

## 2016-11-08 LAB — COMPREHENSIVE METABOLIC PANEL
ALT: 21 U/L (ref 14–54)
AST: 29 U/L (ref 15–41)
Albumin: 3.7 g/dL (ref 3.5–5.0)
Alkaline Phosphatase: 45 U/L (ref 38–126)
Anion gap: 9 (ref 5–15)
BILIRUBIN TOTAL: 0.5 mg/dL (ref 0.3–1.2)
BUN: 13 mg/dL (ref 6–20)
CO2: 30 mmol/L (ref 22–32)
CREATININE: 1.08 mg/dL — AB (ref 0.44–1.00)
Calcium: 9.4 mg/dL (ref 8.9–10.3)
Chloride: 100 mmol/L — ABNORMAL LOW (ref 101–111)
GFR calc Af Amer: 55 mL/min — ABNORMAL LOW (ref >60)
GFR, EST NON AFRICAN AMERICAN: 48 mL/min — AB (ref >60)
Glucose, Bld: 105 mg/dL — ABNORMAL HIGH (ref 65–99)
POTASSIUM: 3 mmol/L — AB (ref 3.5–5.1)
Sodium: 139 mmol/L (ref 135–145)
TOTAL PROTEIN: 6.6 g/dL (ref 6.5–8.1)

## 2016-11-08 MED ORDER — SODIUM CHLORIDE 0.9 % IV SOLN
Freq: Once | INTRAVENOUS | Status: AC
  Administered 2016-11-08: 13:00:00 via INTRAVENOUS
  Filled 2016-11-08: qty 1000

## 2016-11-08 MED ORDER — PACLITAXEL CHEMO INJECTION 300 MG/50ML
80.0000 mg/m2 | Freq: Once | INTRAVENOUS | Status: AC
  Administered 2016-11-08: 156 mg via INTRAVENOUS
  Filled 2016-11-08: qty 26

## 2016-11-08 MED ORDER — DIPHENHYDRAMINE HCL 50 MG/ML IJ SOLN
25.0000 mg | Freq: Once | INTRAMUSCULAR | Status: AC
  Administered 2016-11-08: 25 mg via INTRAVENOUS
  Filled 2016-11-08: qty 1

## 2016-11-08 MED ORDER — FAMOTIDINE IN NACL 20-0.9 MG/50ML-% IV SOLN
20.0000 mg | Freq: Once | INTRAVENOUS | Status: AC
  Administered 2016-11-08: 20 mg via INTRAVENOUS
  Filled 2016-11-08: qty 50

## 2016-11-08 MED ORDER — HEPARIN SOD (PORK) LOCK FLUSH 100 UNIT/ML IV SOLN
500.0000 [IU] | Freq: Once | INTRAVENOUS | Status: AC | PRN
  Administered 2016-11-08: 500 [IU]
  Filled 2016-11-08: qty 5

## 2016-11-08 MED ORDER — DEXAMETHASONE SODIUM PHOSPHATE 10 MG/ML IJ SOLN
10.0000 mg | Freq: Once | INTRAMUSCULAR | Status: AC
  Administered 2016-11-08: 10 mg via INTRAVENOUS
  Filled 2016-11-08: qty 1

## 2016-11-08 NOTE — Progress Notes (Signed)
Pt loosing wt, no appetite, things don't taste good, nauseated.  Toes, fingers tingling. Legs and toes hurt her most at night-achy, and numb. Leg swelling and took extra lasix yest.

## 2016-11-13 NOTE — Progress Notes (Signed)
[place]  [phone] Fax:(336) [phone]  ID: Sandra Fritz OB: 1935-03-15  MR#: [id number]  WGY#:[id number]  Patient Care Team: Jerrol Banana., MD as PCP - General (Unknown Physician Specialty) Jerrol Banana., MD (Family Medicine) Bary Castilla Forest Gleason, MD (General Surgery)  CHIEF COMPLAINT: Clinical stage Ib ER/PR positive, HER-2 negative invasive carcinoma of the upper outer quadrant of the left breast.  INTERVAL HISTORY: Patient returns to clinic today for further evaluation and reconsideration of cycle 4 of 12 of weekly Taxol. She has increased fatigue, but this is improving. She continues to have a significant peripheral neuropathy that is unchanged from one week prior and interrupts her sleep. She continues to have a poor appetite secondary to taste. She denies any recent fevers or illnesses. She has no chest pain or shortness of breath. She denies any vomiting, constipation, or diarrhea. She has no urinary complaints. Patient offers no further specific complaints.  REVIEW OF SYSTEMS:   Review of Systems  Constitutional: Positive for malaise/fatigue and weight loss. Negative for fever.  HENT: Negative for sore throat.   Eyes: Negative for blurred vision, double vision and pain.  Respiratory: Negative.  Negative for cough and shortness of breath.   Cardiovascular: Negative.  Negative for chest pain and leg swelling.  Gastrointestinal: Negative.  Negative for abdominal pain.  Genitourinary: Negative.   Musculoskeletal: Negative.   Skin: Negative.  Negative for rash.  Neurological: Positive for tingling, sensory change and weakness. Negative for headaches.  Psychiatric/Behavioral: Negative.  The patient is not nervous/anxious.     As per HPI. Otherwise, a complete review of systems is negative.  PAST MEDICAL HISTORY: Past Medical History:  Diagnosis Date  . Anemia   . Anxiety   . Arthritis   . Blood transfusion without  reported diagnosis   . Cataract   . Complication of anesthesia    HARD TO WAKE UP AFTER TONSILLECTOMY  . GERD (gastroesophageal reflux disease)   . Hyperlipidemia   . Hypertension   . Neuromuscular disorder ([place])   . Primary cancer of upper outer quadrant of left female breast ([place]) 07/28/2016    PAST SURGICAL HISTORY: Past Surgical History:  Procedure Laterality Date  . APPENDECTOMY  1955   SECONDARY TO RUTURED APPENDIX  . CATARACT EXTRACTION, BILATERAL  2011  . COLONOSCOPY  2002  . DILATION AND CURETTAGE OF UTERUS  1989   Dr. Laurey Morale  . JOINT REPLACEMENT     Right hip  . MOLE REMOVAL  1957   18 removed by Dr. Hoyle Sauer  . NECK SURGERY    . PORTACATH PLACEMENT Right 08/05/2016   Procedure: INSERTION PORT-A-CATH WITH ULTRASOUND;  Surgeon: Robert Bellow, MD;  Location: [facility] ORS;  Service: General;  Laterality: Right;  . TONSILLECTOMY  1955    FAMILY HISTORY: Family History  Problem Relation Age of Onset  . Macular degeneration Mother   . Hypertension Mother   . Diabetes Father   . Heart disease Father   . Heart attack Father 79  . Heart disease Brother 46  . Dermatomyositis Sister   . Cancer Sister        Carcinoma of the uterine call attached to colon and is in remission now  . Breast cancer Maternal Aunt 70  . Breast cancer Paternal Aunt 24  . Breast cancer Cousin        paternal aunt w/ breast ca daughter  . Breast cancer Maternal Aunt 60    ADVANCED DIRECTIVES (Y/N):  N  HEALTH MAINTENANCE: Social History  Substance Use Topics  . Smoking status: Never Smoker  . Smokeless tobacco: Never Used  . Alcohol use 0.6 oz/week    1 Glasses of wine per week     Comment: Maybe 3 per year     Colonoscopy:  PAP:  Bone density:  Lipid panel:  Allergies  Allergen Reactions  . Keflex  [Cephalexin]     hives  . Statins     Muscle and joint pain-patient states she tried all statins.  . Erythromycin Rash    ALL MYCINS    Current Outpatient Prescriptions    Medication Sig Dispense Refill  . ALPRAZolam (XANAX) 0.25 MG tablet Take 1 tablet (0.25 mg total) by mouth at bedtime as needed for anxiety. 30 tablet 0  . aspirin 81 MG tablet Take 81 mg by mouth daily.     Marland Kitchen esomeprazole (NEXIUM) 20 MG capsule Take 1 capsule (20 mg total) by mouth daily. 90 capsule 3  . fluticasone (FLONASE) 50 MCG/ACT nasal spray Place 1 spray into both nostrils daily. (Patient taking differently: Place 1 spray into both nostrils daily as needed. ) 16 g 2  . furosemide (LASIX) 20 MG tablet Take 1 tablet (20 mg total) by mouth daily. 30 tablet 0  . hydrocortisone 2.5 % cream Apply 1 application topically 2 (two) times daily. ON FACE    . lidocaine-prilocaine (EMLA) cream Apply to affected area once 30 g 3  . loratadine (CLARITIN) 10 MG tablet Take 10 mg by mouth daily as needed.     Marland Kitchen losartan (COZAAR) 100 MG tablet Take 1 tablet (100 mg total) by mouth daily. 90 tablet 3  . magic mouthwash w/lidocaine SOLN Take 5 mLs by mouth 4 (four) times daily as needed for mouth pain. 240 mL 0  . Misc Natural Products (OSTEO BI-FLEX JOINT SHIELD PO) Take 2 tablets by mouth daily.    . Multiple Vitamin (MULTIVITAMIN WITH MINERALS) TABS tablet Take 1 tablet by mouth 3 (three) times a week.    . ondansetron (ZOFRAN) 8 MG tablet Take 1 tablet (8 mg total) by mouth 2 (two) times daily as needed. Start on the third day after chemotherapy. 60 tablet 2  . prochlorperazine (COMPAZINE) 10 MG tablet Take 1 tablet (10 mg total) by mouth every 6 (six) hours as needed (Nausea or vomiting). 60 tablet 2  . rosuvastatin (CRESTOR) 5 MG tablet Take 1 tablet (5 mg total) by mouth every other day. 45 tablet 3  . traMADol (ULTRAM) 50 MG tablet Take 1 tablet (50 mg total) by mouth 2 (two) times daily as needed. 60 tablet 5   Current Facility-Administered Medications  Medication Dose Route Frequency Provider Last Rate Last Dose  . sodium chloride 0.9 % 1,000 mL infusion   Intravenous Continuous Jacquelin Hawking, NP 999 mL/hr at 11/01/16 1500     Facility-Administered Medications Ordered in Other Visits  Medication Dose Route Frequency Provider Last Rate Last Dose  . sodium chloride 0.9 % 250 mL with potassium chloride 40 mEq infusion   Intravenous Continuous Lloyd Huger, MD        OBJECTIVE: Vitals:   11/15/16 1045  BP: 130/76  Pulse: 98  Resp: 18  Temp: 98 F (36.7 C)     Body mass index is 22.37 kg/m.    ECOG FS:0 - Asymptomatic  General: Well-developed, well-nourished, no acute distress. Eyes: Pink conjunctiva, anicteric sclera.   Breasts: Palpable left breast mass. Exam deferred today.  HEENT: Clear oropharynx. Lungs: Clear to auscultation bilaterally. Heart: Regular rate and rhythm. No rubs, murmurs, or gallops. Abdomen: Soft, nontender, nondistended. No organomegaly noted, normoactive bowel sounds. Musculoskeletal: No edema, cyanosis, or clubbing. Neuro: Alert, answering all questions appropriately. Cranial nerves grossly intact. Skin: No rashes or petechiae noted. Psych: Normal affect.   LAB RESULTS:  Lab Results  Component Value Date   NA 136 11/15/2016   K 2.7 (LL) 11/15/2016   CL 97 (L) 11/15/2016   CO2 30 11/15/2016   GLUCOSE 111 (H) 11/15/2016   BUN 21 (H) 11/15/2016   CREATININE 1.16 (H) 11/15/2016   CALCIUM 9.5 11/15/2016   PROT 6.2 (L) 11/15/2016   ALBUMIN 3.5 11/15/2016   AST 31 11/15/2016   ALT 24 11/15/2016   ALKPHOS 43 11/15/2016   BILITOT 0.3 11/15/2016   GFRNONAA 44 (L) 11/15/2016   GFRAA 51 (L) 11/15/2016    Lab Results  Component Value Date   WBC 2.8 (L) 11/15/2016   NEUTROABS 2.0 11/15/2016   HGB 8.9 (L) 11/15/2016   HCT 26.1 (L) 11/15/2016   MCV 92.5 11/15/2016   PLT 224 11/15/2016     STUDIES: No results found.  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  PLAN:    1. Clinical stage Ib ER/PR positive, HER-2 negative invasive carcinoma  of the upper outer quadrant of the left breast: Given the size of patient's breast mass at 2.7 cm, have recommended neoadjuvant chemotherapy using Adriamycin, Cytoxan, and Taxol. Patient completed 4 cycles of Adriamycin and Cytoxan on October 14, 2016. Pretreatment MUGA scan revealed an EF of 69%. Once she completes neoadjuvant chemotherapy, will refer back to surgery for possible lumpectomy. Patient will also require adjuvant XRT. Finally, given the ER/PR status of her tumor she will benefit from an aromatase inhibitor for 5 years. Delay cycle 4 of 12 of weekly Taxol today secondary to ongoing peripheral neuropathy. Return to clinic in 1 week for further evaluation and reconsideration of cycle 4. If her neuropathy does not improve, we will consider discontinuing treatment altogether and referring back to surgery. 2. Neutropenia: Mild. Proceed with treatment as above. 3. Mouth sores: Improved. Likely secondary to neutropenia. Continue Magic mouthwash as needed. 4. Subdural hematoma: Secondary to fall. Monitor. 5. Anemia: Mild, monitor. 6. Renal insufficiency:  Mild, monitor. 7. Peripheral neuropathy: Hold Taxol as above. 8. Hypokalemia: Patient will receive 40 mEq IV potassium today. She does not wish oral supplementation, but has agreed to dietary changes.  Patient expressed understanding and was in agreement with this plan. She also understands that She can call clinic at any time with any questions, concerns, or complaints.   Cancer Staging Primary cancer of upper outer quadrant of left female breast [facility]) Staging form: Breast, AJCC 8th Edition - Clinical stage from 07/28/2016: Stage IB (cT2, cN0, cM0, G2, ER: Positive, PR: Positive, HER2: Negative) - Signed by Lloyd Huger, MD on 07/28/2016   Lloyd Huger, MD   11/15/2016 11:21 AM

## 2016-11-15 ENCOUNTER — Inpatient Hospital Stay: Payer: PPO

## 2016-11-15 ENCOUNTER — Inpatient Hospital Stay (HOSPITAL_BASED_OUTPATIENT_CLINIC_OR_DEPARTMENT_OTHER): Payer: PPO | Admitting: Oncology

## 2016-11-15 VITALS — BP 130/76 | HR 98 | Temp 98.0°F | Resp 18 | Wt 151.5 lb

## 2016-11-15 DIAGNOSIS — Z79899 Other long term (current) drug therapy: Secondary | ICD-10-CM

## 2016-11-15 DIAGNOSIS — E785 Hyperlipidemia, unspecified: Secondary | ICD-10-CM

## 2016-11-15 DIAGNOSIS — R5383 Other fatigue: Secondary | ICD-10-CM | POA: Diagnosis not present

## 2016-11-15 DIAGNOSIS — G629 Polyneuropathy, unspecified: Secondary | ICD-10-CM

## 2016-11-15 DIAGNOSIS — K219 Gastro-esophageal reflux disease without esophagitis: Secondary | ICD-10-CM

## 2016-11-15 DIAGNOSIS — E860 Dehydration: Secondary | ICD-10-CM

## 2016-11-15 DIAGNOSIS — C50412 Malignant neoplasm of upper-outer quadrant of left female breast: Secondary | ICD-10-CM | POA: Diagnosis not present

## 2016-11-15 DIAGNOSIS — E876 Hypokalemia: Secondary | ICD-10-CM | POA: Diagnosis not present

## 2016-11-15 DIAGNOSIS — Z170 Estrogen receptor positive status [ER+]: Secondary | ICD-10-CM | POA: Diagnosis not present

## 2016-11-15 DIAGNOSIS — D709 Neutropenia, unspecified: Secondary | ICD-10-CM

## 2016-11-15 DIAGNOSIS — D649 Anemia, unspecified: Secondary | ICD-10-CM | POA: Diagnosis not present

## 2016-11-15 DIAGNOSIS — F419 Anxiety disorder, unspecified: Secondary | ICD-10-CM

## 2016-11-15 DIAGNOSIS — I6200 Nontraumatic subdural hemorrhage, unspecified: Secondary | ICD-10-CM

## 2016-11-15 DIAGNOSIS — Z803 Family history of malignant neoplasm of breast: Secondary | ICD-10-CM

## 2016-11-15 DIAGNOSIS — G919 Hydrocephalus, unspecified: Secondary | ICD-10-CM

## 2016-11-15 DIAGNOSIS — N289 Disorder of kidney and ureter, unspecified: Secondary | ICD-10-CM

## 2016-11-15 DIAGNOSIS — R110 Nausea: Secondary | ICD-10-CM | POA: Diagnosis not present

## 2016-11-15 DIAGNOSIS — R55 Syncope and collapse: Secondary | ICD-10-CM

## 2016-11-15 DIAGNOSIS — I739 Peripheral vascular disease, unspecified: Secondary | ICD-10-CM

## 2016-11-15 DIAGNOSIS — I10 Essential (primary) hypertension: Secondary | ICD-10-CM

## 2016-11-15 DIAGNOSIS — Z7982 Long term (current) use of aspirin: Secondary | ICD-10-CM

## 2016-11-15 DIAGNOSIS — M129 Arthropathy, unspecified: Secondary | ICD-10-CM

## 2016-11-15 DIAGNOSIS — Z5111 Encounter for antineoplastic chemotherapy: Secondary | ICD-10-CM | POA: Diagnosis not present

## 2016-11-15 LAB — COMPREHENSIVE METABOLIC PANEL
ALBUMIN: 3.5 g/dL (ref 3.5–5.0)
ALT: 24 U/L (ref 14–54)
AST: 31 U/L (ref 15–41)
Alkaline Phosphatase: 43 U/L (ref 38–126)
Anion gap: 9 (ref 5–15)
BUN: 21 mg/dL — AB (ref 6–20)
CHLORIDE: 97 mmol/L — AB (ref 101–111)
CO2: 30 mmol/L (ref 22–32)
CREATININE: 1.16 mg/dL — AB (ref 0.44–1.00)
Calcium: 9.5 mg/dL (ref 8.9–10.3)
GFR calc Af Amer: 51 mL/min — ABNORMAL LOW (ref >60)
GFR calc non Af Amer: 44 mL/min — ABNORMAL LOW (ref >60)
GLUCOSE: 111 mg/dL — AB (ref 65–99)
POTASSIUM: 2.7 mmol/L — AB (ref 3.5–5.1)
Sodium: 136 mmol/L (ref 135–145)
TOTAL PROTEIN: 6.2 g/dL — AB (ref 6.5–8.1)
Total Bilirubin: 0.3 mg/dL (ref 0.3–1.2)

## 2016-11-15 LAB — CBC WITH DIFFERENTIAL/PLATELET
BASOS ABS: 0 10*3/uL (ref 0–0.1)
BASOS PCT: 1 %
Eosinophils Absolute: 0 10*3/uL (ref 0–0.7)
Eosinophils Relative: 1 %
HEMATOCRIT: 26.1 % — AB (ref 35.0–47.0)
Hemoglobin: 8.9 g/dL — ABNORMAL LOW (ref 12.0–16.0)
LYMPHS PCT: 19 %
Lymphs Abs: 0.5 10*3/uL — ABNORMAL LOW (ref 1.0–3.6)
MCH: 31.6 pg (ref 26.0–34.0)
MCHC: 34.2 g/dL (ref 32.0–36.0)
MCV: 92.5 fL (ref 80.0–100.0)
MONO ABS: 0.2 10*3/uL (ref 0.2–0.9)
Monocytes Relative: 7 %
NEUTROS ABS: 2 10*3/uL (ref 1.4–6.5)
NEUTROS PCT: 72 %
Platelets: 224 10*3/uL (ref 150–440)
RBC: 2.82 MIL/uL — AB (ref 3.80–5.20)
RDW: 21.7 % — AB (ref 11.5–14.5)
WBC: 2.8 10*3/uL — AB (ref 3.6–11.0)

## 2016-11-15 MED ORDER — HEPARIN SOD (PORK) LOCK FLUSH 100 UNIT/ML IV SOLN
500.0000 [IU] | Freq: Once | INTRAVENOUS | Status: AC
  Administered 2016-11-15: 500 [IU] via INTRAVENOUS

## 2016-11-15 MED ORDER — SODIUM CHLORIDE 0.9 % IV SOLN
INTRAVENOUS | Status: DC
  Administered 2016-11-15: 12:00:00 via INTRAVENOUS
  Filled 2016-11-15 (×2): qty 250

## 2016-11-15 NOTE — Progress Notes (Signed)
Patient is here for follow up, she mentions she has discomfort in her legs and feet at night.

## 2016-11-18 NOTE — Progress Notes (Signed)
Sandra Fritz  Telephone:(336) [phone] Fax:(336) [phone]  ID: AJIA CHADDERDON OB: 11/26/1934  MR#: [id number]  OQH#:[id number]  Patient Care Team: Jerrol Banana., MD as PCP - General (Unknown Physician Specialty) Jerrol Banana., MD (Family Medicine) Bary Castilla Forest Gleason, MD (General Surgery)  CHIEF COMPLAINT: Clinical stage Ib ER/PR positive, HER-2 negative invasive carcinoma of the upper outer quadrant of the left breast.  INTERVAL HISTORY: Patient returns to clinic today for further evaluation and reconsideration of cycle 4 of 12 of weekly Taxol. Despite not having received treatment since November 08, 2016, performance status continues to decline with increased weakness and fatigue. She has a poor appetite which she attributes to lack of taste, but denies nausea. She also has a persistent peripheral neuropathy and has difficulty ambulating. She continues to have insomnia. She denies any recent fevers or illnesses. She has no chest pain or shortness of breath. She denies any vomiting, constipation, or diarrhea. She has no urinary complaints. Patient feels generally terrible, offers no further specific complaints.  REVIEW OF SYSTEMS:   Review of Systems  Constitutional: Positive for malaise/fatigue and weight loss. Negative for fever.  HENT: Negative for sore throat.   Eyes: Negative for blurred vision, double vision and pain.  Respiratory: Negative.  Negative for cough and shortness of breath.   Cardiovascular: Negative.  Negative for chest pain and leg swelling.  Gastrointestinal: Negative.  Negative for abdominal pain.  Genitourinary: Negative.   Musculoskeletal: Negative.   Skin: Negative.  Negative for rash.  Neurological: Positive for tingling, sensory change and weakness. Negative for headaches.  Psychiatric/Behavioral: Negative.  The patient is not nervous/anxious.     As per HPI. Otherwise, a complete review of systems is negative.  PAST  MEDICAL HISTORY: Past Medical History:  Diagnosis Date  . Anemia   . Anxiety   . Arthritis   . Blood transfusion without reported diagnosis   . Cataract   . Complication of anesthesia    HARD TO WAKE UP AFTER TONSILLECTOMY  . GERD (gastroesophageal reflux disease)   . Hyperlipidemia   . Hypertension   . Neuromuscular disorder ([place])   . Primary cancer of upper outer quadrant of left female breast ([place]) 07/28/2016    PAST SURGICAL HISTORY: Past Surgical History:  Procedure Laterality Date  . APPENDECTOMY  1955   SECONDARY TO RUTURED APPENDIX  . CATARACT EXTRACTION, BILATERAL  2011  . COLONOSCOPY  2002  . DILATION AND CURETTAGE OF UTERUS  1989   Dr. Laurey Morale  . JOINT REPLACEMENT     Right hip  . MOLE REMOVAL  1957   18 removed by Dr. Hoyle Sauer  . NECK SURGERY    . PORTACATH PLACEMENT Right 08/05/2016   Procedure: INSERTION PORT-A-CATH WITH ULTRASOUND;  Surgeon: Robert Bellow, MD;  Location: [facility] ORS;  Service: General;  Laterality: Right;  . TONSILLECTOMY  1955    FAMILY HISTORY: Family History  Problem Relation Age of Onset  . Macular degeneration Mother   . Hypertension Mother   . Diabetes Father   . Heart disease Father   . Heart attack Father 32  . Heart disease Brother 8  . Dermatomyositis Sister   . Cancer Sister        Carcinoma of the uterine call attached to colon and is in remission now  . Breast cancer Maternal Aunt 70  . Breast cancer Paternal Aunt 28  . Breast cancer Cousin        paternal aunt w/ breast  ca daughter  . Breast cancer Maternal Aunt 60    ADVANCED DIRECTIVES (Y/N):  N  HEALTH MAINTENANCE: Social History  Substance Use Topics  . Smoking status: Never Smoker  . Smokeless tobacco: Never Used  . Alcohol use 0.6 oz/week    1 Glasses of wine per week     Comment: Maybe 3 per year     Colonoscopy:  PAP:  Bone density:  Lipid panel:  Allergies  Allergen Reactions  . Keflex  [Cephalexin]     hives  . Statins     Muscle  and joint pain-patient states she tried all statins.  . Erythromycin Rash    ALL MYCINS    Current Outpatient Prescriptions  Medication Sig Dispense Refill  . ALPRAZolam (XANAX) 0.25 MG tablet Take 1 tablet (0.25 mg total) by mouth at bedtime as needed for anxiety. 30 tablet 0  . aspirin 81 MG tablet Take 81 mg by mouth daily.     Marland Kitchen esomeprazole (NEXIUM) 20 MG capsule Take 1 capsule (20 mg total) by mouth daily. 90 capsule 3  . furosemide (LASIX) 20 MG tablet Take 1 tablet (20 mg total) by mouth daily. 30 tablet 0  . lidocaine-prilocaine (EMLA) cream Apply to affected area once 30 g 3  . losartan (COZAAR) 100 MG tablet Take 1 tablet (100 mg total) by mouth daily. 90 tablet 3  . magic mouthwash w/lidocaine SOLN Take 5 mLs by mouth 4 (four) times daily as needed for mouth pain. 240 mL 0  . Misc Natural Products (OSTEO BI-FLEX JOINT SHIELD PO) Take 2 tablets by mouth daily.    . ondansetron (ZOFRAN) 8 MG tablet Take 1 tablet (8 mg total) by mouth 2 (two) times daily as needed. Start on the third day after chemotherapy. 60 tablet 2  . prochlorperazine (COMPAZINE) 10 MG tablet Take 1 tablet (10 mg total) by mouth every 6 (six) hours as needed (Nausea or vomiting). 60 tablet 2  . rosuvastatin (CRESTOR) 5 MG tablet Take 1 tablet (5 mg total) by mouth every other day. 45 tablet 3  . traMADol (ULTRAM) 50 MG tablet Take 1 tablet (50 mg total) by mouth 2 (two) times daily as needed. 60 tablet 5  . fluticasone (FLONASE) 50 MCG/ACT nasal spray Place 1 spray into both nostrils daily. (Patient not taking: Reported on 11/22/2016) 16 g 2  . hydrocortisone 2.5 % cream Apply 1 application topically 2 (two) times daily. ON FACE    . loratadine (CLARITIN) 10 MG tablet Take 10 mg by mouth daily as needed.     . Multiple Vitamin (MULTIVITAMIN WITH MINERALS) TABS tablet Take 1 tablet by mouth 3 (three) times a week.     Current Facility-Administered Medications  Medication Dose Route Frequency Provider Last Rate Last  Dose  . sodium chloride 0.9 % 1,000 mL infusion   Intravenous Continuous Jacquelin Hawking, NP 999 mL/hr at 11/01/16 1500      OBJECTIVE: Vitals:   11/22/16 1401  BP: 131/73  Pulse: 93  Resp: 18  Temp: 97.9 F (36.6 C)     There is no height or weight on file to calculate BMI.    ECOG FS:0 - Asymptomatic  General: Well-developed, well-nourished, no acute distress. Eyes: Pink conjunctiva, anicteric sclera.   Breasts: Palpable left breast mass, significantly decreased in size from initial exam. HEENT: Clear oropharynx. Lungs: Clear to auscultation bilaterally. Heart: Regular rate and rhythm. No rubs, murmurs, or gallops. Abdomen: Soft, nontender, nondistended. No organomegaly noted, normoactive bowel  sounds. Musculoskeletal: No edema, cyanosis, or clubbing. Neuro: Alert, answering all questions appropriately. Cranial nerves grossly intact. Skin: No rashes or petechiae noted. Psych: Normal affect.   LAB RESULTS:  Lab Results  Component Value Date   NA 139 11/22/2016   K 2.8 (L) 11/22/2016   CL 98 (L) 11/22/2016   CO2 30 11/22/2016   GLUCOSE 115 (H) 11/22/2016   BUN 15 11/22/2016   CREATININE 1.15 (H) 11/22/2016   CALCIUM 9.9 11/22/2016   PROT 6.5 11/22/2016   ALBUMIN 3.7 11/22/2016   AST 36 11/22/2016   ALT 20 11/22/2016   ALKPHOS 46 11/22/2016   BILITOT 0.5 11/22/2016   GFRNONAA 44 (L) 11/22/2016   GFRAA 51 (L) 11/22/2016    Lab Results  Component Value Date   WBC 2.1 (L) 11/22/2016   NEUTROABS 1.1 (L) 11/22/2016   HGB 9.6 (L) 11/22/2016   HCT 27.8 (L) 11/22/2016   MCV 92.4 11/22/2016   PLT 257 11/22/2016     STUDIES: No results found.  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  PLAN:    1. 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 She only received 3 cycles of weekly Taxol, her last one on November 08, 2016. Given her worsening performance status and peripheral neuropathy will discontinue treatment altogether. A referral was made back to surgery for consideration of possible lumpectomy. Patient will also require adjuvant XRT if she pursues lumpectomy. Finally, given the ER/PR status of her tumor she will benefit from an aromatase inhibitor for 5 years. Return to clinic in 1 week for further evaluation  2. Neutropenia: Mild. Discontinue treatment as above. 3. Mouth sores: Improved. Likely secondary to neutropenia. Continue Magic mouthwash as needed. 4. Subdural hematoma: Secondary to fall. Monitor. 5. Anemia: Mild, monitor. 6. Renal insufficiency:  Mild, monitor. Patient will receive 1 L of IV fluids today. 7. Peripheral neuropathy: Discontinue Taxol as above. Patient was given a referral back to neurology for further evaluation. 8. Hypokalemia: Patient will receive 40 mEq IV potassium today. She does not wish oral supplementation, but has agreed to dietary changes.  Patient expressed understanding and was in agreement with this plan. She also understands that She can call clinic at any time with any questions, concerns, or complaints.   Cancer Staging Primary cancer of upper outer quadrant of left female breast [facility]) Staging form: Breast, AJCC 8th Edition - Clinical stage from 07/28/2016: Stage IB (cT2, cN0, cM0, G2, ER: Positive, PR: Positive, HER2: Negative) - Signed by Lloyd Huger, MD on 07/28/2016   Lloyd Huger, MD   11/22/2016 2:04 PM

## 2016-11-22 ENCOUNTER — Inpatient Hospital Stay: Payer: PPO | Attending: Oncology

## 2016-11-22 ENCOUNTER — Inpatient Hospital Stay: Payer: PPO

## 2016-11-22 ENCOUNTER — Inpatient Hospital Stay (HOSPITAL_BASED_OUTPATIENT_CLINIC_OR_DEPARTMENT_OTHER): Payer: PPO | Admitting: Oncology

## 2016-11-22 VITALS — BP 131/73 | HR 93 | Temp 97.9°F | Resp 18

## 2016-11-22 DIAGNOSIS — F419 Anxiety disorder, unspecified: Secondary | ICD-10-CM | POA: Diagnosis not present

## 2016-11-22 DIAGNOSIS — I10 Essential (primary) hypertension: Secondary | ICD-10-CM

## 2016-11-22 DIAGNOSIS — E876 Hypokalemia: Secondary | ICD-10-CM | POA: Diagnosis not present

## 2016-11-22 DIAGNOSIS — K219 Gastro-esophageal reflux disease without esophagitis: Secondary | ICD-10-CM | POA: Diagnosis not present

## 2016-11-22 DIAGNOSIS — D649 Anemia, unspecified: Secondary | ICD-10-CM | POA: Insufficient documentation

## 2016-11-22 DIAGNOSIS — E785 Hyperlipidemia, unspecified: Secondary | ICD-10-CM

## 2016-11-22 DIAGNOSIS — N2889 Other specified disorders of kidney and ureter: Secondary | ICD-10-CM

## 2016-11-22 DIAGNOSIS — Z87891 Personal history of nicotine dependence: Secondary | ICD-10-CM | POA: Diagnosis not present

## 2016-11-22 DIAGNOSIS — Z7982 Long term (current) use of aspirin: Secondary | ICD-10-CM | POA: Diagnosis not present

## 2016-11-22 DIAGNOSIS — C50412 Malignant neoplasm of upper-outer quadrant of left female breast: Secondary | ICD-10-CM | POA: Diagnosis not present

## 2016-11-22 DIAGNOSIS — Z79899 Other long term (current) drug therapy: Secondary | ICD-10-CM | POA: Insufficient documentation

## 2016-11-22 DIAGNOSIS — R531 Weakness: Secondary | ICD-10-CM | POA: Insufficient documentation

## 2016-11-22 DIAGNOSIS — S065X9S Traumatic subdural hemorrhage with loss of consciousness of unspecified duration, sequela: Secondary | ICD-10-CM | POA: Diagnosis not present

## 2016-11-22 DIAGNOSIS — Z170 Estrogen receptor positive status [ER+]: Secondary | ICD-10-CM

## 2016-11-22 DIAGNOSIS — Z803 Family history of malignant neoplasm of breast: Secondary | ICD-10-CM | POA: Insufficient documentation

## 2016-11-22 DIAGNOSIS — D709 Neutropenia, unspecified: Secondary | ICD-10-CM

## 2016-11-22 DIAGNOSIS — G629 Polyneuropathy, unspecified: Secondary | ICD-10-CM

## 2016-11-22 DIAGNOSIS — R5383 Other fatigue: Secondary | ICD-10-CM | POA: Diagnosis not present

## 2016-11-22 LAB — CBC WITH DIFFERENTIAL/PLATELET
BASOS ABS: 0 10*3/uL (ref 0–0.1)
BASOS PCT: 1 %
EOS PCT: 3 %
Eosinophils Absolute: 0.1 10*3/uL (ref 0–0.7)
HEMATOCRIT: 27.8 % — AB (ref 35.0–47.0)
Hemoglobin: 9.6 g/dL — ABNORMAL LOW (ref 12.0–16.0)
Lymphocytes Relative: 27 %
Lymphs Abs: 0.6 10*3/uL — ABNORMAL LOW (ref 1.0–3.6)
MCH: 31.9 pg (ref 26.0–34.0)
MCHC: 34.5 g/dL (ref 32.0–36.0)
MCV: 92.4 fL (ref 80.0–100.0)
MONO ABS: 0.3 10*3/uL (ref 0.2–0.9)
MONOS PCT: 16 %
NEUTROS ABS: 1.1 10*3/uL — AB (ref 1.4–6.5)
Neutrophils Relative %: 53 %
PLATELETS: 257 10*3/uL (ref 150–440)
RBC: 3.01 MIL/uL — ABNORMAL LOW (ref 3.80–5.20)
RDW: 20.1 % — ABNORMAL HIGH (ref 11.5–14.5)
WBC: 2.1 10*3/uL — ABNORMAL LOW (ref 3.6–11.0)

## 2016-11-22 LAB — COMPREHENSIVE METABOLIC PANEL
ALBUMIN: 3.7 g/dL (ref 3.5–5.0)
ALT: 20 U/L (ref 14–54)
ANION GAP: 11 (ref 5–15)
AST: 36 U/L (ref 15–41)
Alkaline Phosphatase: 46 U/L (ref 38–126)
BILIRUBIN TOTAL: 0.5 mg/dL (ref 0.3–1.2)
BUN: 15 mg/dL (ref 6–20)
CHLORIDE: 98 mmol/L — AB (ref 101–111)
CO2: 30 mmol/L (ref 22–32)
Calcium: 9.9 mg/dL (ref 8.9–10.3)
Creatinine, Ser: 1.15 mg/dL — ABNORMAL HIGH (ref 0.44–1.00)
GFR calc Af Amer: 51 mL/min — ABNORMAL LOW (ref >60)
GFR, EST NON AFRICAN AMERICAN: 44 mL/min — AB (ref >60)
GLUCOSE: 115 mg/dL — AB (ref 65–99)
POTASSIUM: 2.8 mmol/L — AB (ref 3.5–5.1)
Sodium: 139 mmol/L (ref 135–145)
TOTAL PROTEIN: 6.5 g/dL (ref 6.5–8.1)

## 2016-11-22 MED ORDER — POTASSIUM CHLORIDE 2 MEQ/ML IV SOLN
Freq: Once | INTRAVENOUS | Status: AC
  Administered 2016-11-22: 15:00:00 via INTRAVENOUS
  Filled 2016-11-22: qty 250

## 2016-11-22 MED ORDER — DEXAMETHASONE 4 MG PO TABS: 4.0000 mg | ORAL_TABLET | Freq: Two times a day (BID) | ORAL | 0 refills | Status: DC

## 2016-11-22 MED ORDER — HEPARIN SOD (PORK) LOCK FLUSH 100 UNIT/ML IV SOLN
500.0000 [IU] | Freq: Once | INTRAVENOUS | Status: AC
Start: 2016-11-22 — End: 2016-11-22
  Administered 2016-11-22: 500 [IU] via INTRAVENOUS

## 2016-11-22 MED ORDER — SODIUM CHLORIDE 0.9 % IV SOLN
Freq: Once | INTRAVENOUS | Status: AC
  Administered 2016-11-22: 15:00:00 via INTRAVENOUS
  Filled 2016-11-22: qty 1000

## 2016-11-22 MED ORDER — ALPRAZOLAM 0.25 MG PO TABS: 0.2500 mg | ORAL_TABLET | Freq: Every evening | ORAL | 0 refills | Status: DC | PRN

## 2016-11-22 NOTE — Progress Notes (Signed)
Called Dr. Grayland Ormond to inform him that patient potassium is 2.8 today. Ordered to give one liter of IV fluids and 40 mEq of potassium. No chemotherapy treatment today.

## 2016-11-25 NOTE — Progress Notes (Signed)
[facility]  [phone]) [phone] [phone]  ID: Sandra Fritz OB: 02-14-1935  MR#: [id number]  AOZ#:[id number]  Patient Care Team: Jerrol Banana., MD as PCP - General (Unknown Physician Specialty) Jerrol Banana., MD (Family Medicine) Bary Castilla Forest Gleason, MD (General Surgery)  CHIEF COMPLAINT: Clinical stage Ib ER/PR positive, HER-2 negative invasive carcinoma of the upper outer quadrant of the left breast.  INTERVAL HISTORY: Patient returns to clinic today for repeat laboratory work and further evaluation. She feels improved, but not back to her baseline. She continues to have significant weakness and fatigue. She states her peripheral neuropathy has resolved. She continues to have insomnia. She denies any recent fevers or illnesses. She has no chest pain or shortness of breath. She denies any vomiting, constipation, or diarrhea. She has no urinary complaints. Patient feels generally terrible, offers no further specific complaints.  REVIEW OF SYSTEMS:   Review of Systems  Constitutional: Positive for malaise/fatigue. Negative for fever and weight loss.  HENT: Negative for sore throat.   Eyes: Negative for blurred vision, double vision and pain.  Respiratory: Negative.  Negative for cough and shortness of breath.   Cardiovascular: Negative.  Negative for chest pain and leg swelling.  Gastrointestinal: Negative.  Negative for abdominal pain.  Genitourinary: Negative.   Musculoskeletal: Negative.   Skin: Negative.  Negative for rash.  Neurological: Positive for weakness. Negative for tingling, sensory change and headaches.  Psychiatric/Behavioral: The patient has insomnia. The patient is not nervous/anxious.     As per HPI. Otherwise, a complete review of systems is negative.  PAST MEDICAL HISTORY: Past Medical History:  Diagnosis Date  . Anemia   . Anxiety   . Arthritis   . Blood transfusion without reported diagnosis   .  Cataract   . Complication of anesthesia    HARD TO WAKE UP AFTER TONSILLECTOMY  . GERD (gastroesophageal reflux disease)   . Hyperlipidemia   . Hypertension   . Neuromuscular disorder ([place])   . Primary cancer of upper outer quadrant of left female breast ([place]) 07/28/2016    PAST SURGICAL HISTORY: Past Surgical History:  Procedure Laterality Date  . APPENDECTOMY  1955   SECONDARY TO RUTURED APPENDIX  . CATARACT EXTRACTION, BILATERAL  2011  . COLONOSCOPY  2002  . DILATION AND CURETTAGE OF UTERUS  1989   Dr. Laurey Morale  . JOINT REPLACEMENT     Right hip  . MOLE REMOVAL  1957   18 removed by Dr. Hoyle Sauer  . NECK SURGERY    . PORTACATH PLACEMENT Right 08/05/2016   Procedure: INSERTION PORT-A-CATH WITH ULTRASOUND;  Surgeon: Robert Bellow, MD;  Location: [facility] ORS;  Service: General;  Laterality: Right;  . TONSILLECTOMY  1955    FAMILY HISTORY: Family History  Problem Relation Age of Onset  . Macular degeneration Mother   . Hypertension Mother   . Diabetes Father   . Heart disease Father   . Heart attack Father 40  . Heart disease Brother 60  . Dermatomyositis Sister   . Cancer Sister        Carcinoma of the uterine call attached to colon and is in remission now  . Breast cancer Maternal Aunt 70  . Breast cancer Paternal Aunt 41  . Breast cancer Cousin        paternal aunt w/ breast ca daughter  . Breast cancer Maternal Aunt 59    ADVANCED DIRECTIVES (Y/N):  N  HEALTH MAINTENANCE: Social History  Substance Use  Topics  . Smoking status: Never Smoker  . Smokeless tobacco: Never Used  . Alcohol use 0.6 oz/week    1 Glasses of wine per week     Comment: Maybe 3 per year     Colonoscopy:  PAP:  Bone density:  Lipid panel:  Allergies  Allergen Reactions  . Keflex  [Cephalexin]     hives  . Statins     Muscle and joint pain-patient states she tried all statins.  . Erythromycin Rash    ALL MYCINS    Current Outpatient Prescriptions  Medication Sig  Dispense Refill  . ALPRAZolam (XANAX) 0.25 MG tablet Take 1 tablet (0.25 mg total) by mouth at bedtime as needed for anxiety. 30 tablet 0  . aspirin 81 MG tablet Take 81 mg by mouth daily.     Marland Kitchen dexamethasone (DECADRON) 4 MG tablet Take 1 tablet (4 mg total) by mouth 2 (two) times daily with a meal. 60 tablet 0  . esomeprazole (NEXIUM) 20 MG capsule Take 1 capsule (20 mg total) by mouth daily. 90 capsule 3  . furosemide (LASIX) 20 MG tablet Take 1 tablet (20 mg total) by mouth daily. 30 tablet 0  . hydrocortisone 2.5 % cream Apply 1 application topically 2 (two) times daily. ON FACE    . lidocaine-prilocaine (EMLA) cream Apply to affected area once 30 g 3  . loratadine (CLARITIN) 10 MG tablet Take 10 mg by mouth daily as needed.     Marland Kitchen losartan (COZAAR) 100 MG tablet Take 1 tablet (100 mg total) by mouth daily. 90 tablet 3  . magic mouthwash w/lidocaine SOLN Take 5 mLs by mouth 4 (four) times daily as needed for mouth pain. 240 mL 0  . Misc Natural Products (OSTEO BI-FLEX JOINT SHIELD PO) Take 2 tablets by mouth daily.    . Multiple Vitamin (MULTIVITAMIN WITH MINERALS) TABS tablet Take 1 tablet by mouth 3 (three) times a week.    . ondansetron (ZOFRAN) 8 MG tablet Take 1 tablet (8 mg total) by mouth 2 (two) times daily as needed. Start on the third day after chemotherapy. 60 tablet 2  . prochlorperazine (COMPAZINE) 10 MG tablet Take 1 tablet (10 mg total) by mouth every 6 (six) hours as needed (Nausea or vomiting). 60 tablet 2  . rosuvastatin (CRESTOR) 5 MG tablet Take 1 tablet (5 mg total) by mouth every other day. 45 tablet 3  . traMADol (ULTRAM) 50 MG tablet Take 1 tablet (50 mg total) by mouth 2 (two) times daily as needed. 60 tablet 5  . fluticasone (FLONASE) 50 MCG/ACT nasal spray Place 1 spray into both nostrils daily. (Patient not taking: Reported on 11/22/2016) 16 g 2   Current Facility-Administered Medications  Medication Dose Route Frequency Provider Last Rate Last Dose  . sodium  chloride 0.9 % 1,000 mL infusion   Intravenous Continuous Jacquelin Hawking, NP 999 mL/hr at 11/01/16 1500      OBJECTIVE: Vitals:   11/29/16 1442  BP: (!) 152/81  Pulse: 82  Resp: 20  Temp: 98.4 F (36.9 C)     Body mass index is 21.97 kg/m.    ECOG FS:0 - Asymptomatic  General: Well-developed, well-nourished, no acute distress. Eyes: Pink conjunctiva, anicteric sclera.   Breasts: Palpable left breast mass, significantly decreased in size from initial exam. HEENT: Clear oropharynx. Lungs: Clear to auscultation bilaterally. Heart: Regular rate and rhythm. No rubs, murmurs, or gallops. Abdomen: Soft, nontender, nondistended. No organomegaly noted, normoactive bowel sounds. Musculoskeletal: No edema,  cyanosis, or clubbing. Neuro: Alert, answering all questions appropriately. Cranial nerves grossly intact. Skin: No rashes or petechiae noted. Psych: Normal affect.   LAB RESULTS:  Lab Results  Component Value Date   NA 135 11/29/2016   K 3.4 (L) 11/29/2016   CL 95 (L) 11/29/2016   CO2 30 11/29/2016   GLUCOSE 112 (H) 11/29/2016   BUN 27 (H) 11/29/2016   CREATININE 1.15 (H) 11/29/2016   CALCIUM 9.6 11/29/2016   PROT 6.6 11/29/2016   ALBUMIN 3.9 11/29/2016   AST 31 11/29/2016   ALT 20 11/29/2016   ALKPHOS 47 11/29/2016   BILITOT 0.6 11/29/2016   GFRNONAA 44 (L) 11/29/2016   GFRAA 51 (L) 11/29/2016    Lab Results  Component Value Date   WBC 6.9 11/29/2016   NEUTROABS 5.6 11/29/2016   HGB 10.9 (L) 11/29/2016   HCT 31.8 (L) 11/29/2016   MCV 92.0 11/29/2016   PLT 328 11/29/2016     STUDIES: No results found.  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  PLAN:    1. 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 She only received 3 cycles of  weekly Taxol, her last one on November 08, 2016. Given her worsening performance status and peripheral neuropathy treatment was discontinued altogether. A referral was made back to surgery for consideration of possible lumpectomy. Patient will also require adjuvant XRT if she pursues lumpectomy. Finally, given the ER/PR status of her tumor she will benefit from an aromatase inhibitor for 5 years. Return to clinic in 1 month for further evaluation  2. Neutropenia: Resolved. 3. Mouth sores: Improved. Continue Magic mouthwash as needed. 4. Subdural hematoma: Secondary to fall. Monitor. 5. Anemia: Mild, monitor. 6. Renal insufficiency:  Mild, monitor. Patient declined IV fluids today 7. Peripheral neuropathy: Resolved. Taxol has been discontinued. Patient canceled her appointment with neurology. 8. Hypokalemia:  improving. Patient does not require IV potassium today. She does not wish oral supplementation, but has agreed to dietary changes.  Patient expressed understanding and was in agreement with this plan. She also understands that She can call clinic at any time with any questions, concerns, or complaints.   Cancer Staging Primary cancer of upper outer quadrant of left female breast [facility]) Staging form: Breast, AJCC 8th Edition - Clinical stage from 07/28/2016: Stage IB (cT2, cN0, cM0, G2, ER: Positive, PR: Positive, HER2: Negative) - Signed by Lloyd Huger, MD on 07/28/2016   Lloyd Huger, MD   12/02/2016 12:49 PM

## 2016-11-29 ENCOUNTER — Inpatient Hospital Stay: Payer: PPO

## 2016-11-29 ENCOUNTER — Inpatient Hospital Stay (HOSPITAL_BASED_OUTPATIENT_CLINIC_OR_DEPARTMENT_OTHER): Payer: PPO | Admitting: Oncology

## 2016-11-29 VITALS — BP 152/81 | HR 82 | Temp 98.4°F | Resp 20 | Wt 148.8 lb

## 2016-11-29 DIAGNOSIS — R531 Weakness: Secondary | ICD-10-CM

## 2016-11-29 DIAGNOSIS — Z87891 Personal history of nicotine dependence: Secondary | ICD-10-CM

## 2016-11-29 DIAGNOSIS — Z79899 Other long term (current) drug therapy: Secondary | ICD-10-CM

## 2016-11-29 DIAGNOSIS — D649 Anemia, unspecified: Secondary | ICD-10-CM

## 2016-11-29 DIAGNOSIS — N2889 Other specified disorders of kidney and ureter: Secondary | ICD-10-CM

## 2016-11-29 DIAGNOSIS — E876 Hypokalemia: Secondary | ICD-10-CM | POA: Diagnosis not present

## 2016-11-29 DIAGNOSIS — Z170 Estrogen receptor positive status [ER+]: Secondary | ICD-10-CM

## 2016-11-29 DIAGNOSIS — K219 Gastro-esophageal reflux disease without esophagitis: Secondary | ICD-10-CM

## 2016-11-29 DIAGNOSIS — Z7982 Long term (current) use of aspirin: Secondary | ICD-10-CM

## 2016-11-29 DIAGNOSIS — D709 Neutropenia, unspecified: Secondary | ICD-10-CM

## 2016-11-29 DIAGNOSIS — F419 Anxiety disorder, unspecified: Secondary | ICD-10-CM

## 2016-11-29 DIAGNOSIS — E785 Hyperlipidemia, unspecified: Secondary | ICD-10-CM

## 2016-11-29 DIAGNOSIS — R5383 Other fatigue: Secondary | ICD-10-CM

## 2016-11-29 DIAGNOSIS — C50412 Malignant neoplasm of upper-outer quadrant of left female breast: Secondary | ICD-10-CM | POA: Diagnosis not present

## 2016-11-29 DIAGNOSIS — S065X9S Traumatic subdural hemorrhage with loss of consciousness of unspecified duration, sequela: Secondary | ICD-10-CM | POA: Diagnosis not present

## 2016-11-29 DIAGNOSIS — G629 Polyneuropathy, unspecified: Secondary | ICD-10-CM | POA: Diagnosis not present

## 2016-11-29 DIAGNOSIS — Z95828 Presence of other vascular implants and grafts: Secondary | ICD-10-CM

## 2016-11-29 DIAGNOSIS — I10 Essential (primary) hypertension: Secondary | ICD-10-CM

## 2016-11-29 DIAGNOSIS — Z803 Family history of malignant neoplasm of breast: Secondary | ICD-10-CM

## 2016-11-29 LAB — COMPREHENSIVE METABOLIC PANEL
ALT: 20 U/L (ref 14–54)
ANION GAP: 10 (ref 5–15)
AST: 31 U/L (ref 15–41)
Albumin: 3.9 g/dL (ref 3.5–5.0)
Alkaline Phosphatase: 47 U/L (ref 38–126)
BUN: 27 mg/dL — ABNORMAL HIGH (ref 6–20)
CALCIUM: 9.6 mg/dL (ref 8.9–10.3)
CHLORIDE: 95 mmol/L — AB (ref 101–111)
CO2: 30 mmol/L (ref 22–32)
CREATININE: 1.15 mg/dL — AB (ref 0.44–1.00)
GFR, EST AFRICAN AMERICAN: 51 mL/min — AB (ref >60)
GFR, EST NON AFRICAN AMERICAN: 44 mL/min — AB (ref >60)
Glucose, Bld: 112 mg/dL — ABNORMAL HIGH (ref 65–99)
Potassium: 3.4 mmol/L — ABNORMAL LOW (ref 3.5–5.1)
SODIUM: 135 mmol/L (ref 135–145)
Total Bilirubin: 0.6 mg/dL (ref 0.3–1.2)
Total Protein: 6.6 g/dL (ref 6.5–8.1)

## 2016-11-29 LAB — CBC WITH DIFFERENTIAL/PLATELET
BASOS ABS: 0 10*3/uL (ref 0–0.1)
BASOS PCT: 0 %
EOS PCT: 0 %
Eosinophils Absolute: 0 10*3/uL (ref 0–0.7)
HCT: 31.8 % — ABNORMAL LOW (ref 35.0–47.0)
Hemoglobin: 10.9 g/dL — ABNORMAL LOW (ref 12.0–16.0)
LYMPHS PCT: 10 %
Lymphs Abs: 0.7 10*3/uL — ABNORMAL LOW (ref 1.0–3.6)
MCH: 31.5 pg (ref 26.0–34.0)
MCHC: 34.3 g/dL (ref 32.0–36.0)
MCV: 92 fL (ref 80.0–100.0)
Monocytes Absolute: 0.7 10*3/uL (ref 0.2–0.9)
Monocytes Relative: 10 %
NEUTROS ABS: 5.6 10*3/uL (ref 1.4–6.5)
Neutrophils Relative %: 80 %
PLATELETS: 328 10*3/uL (ref 150–440)
RBC: 3.45 MIL/uL — AB (ref 3.80–5.20)
RDW: 19.6 % — ABNORMAL HIGH (ref 11.5–14.5)
WBC: 6.9 10*3/uL (ref 3.6–11.0)

## 2016-11-29 MED ORDER — HEPARIN SOD (PORK) LOCK FLUSH 100 UNIT/ML IV SOLN
500.0000 [IU] | Freq: Once | INTRAVENOUS | Status: AC
  Administered 2016-11-29: 500 [IU] via INTRAVENOUS

## 2016-11-29 NOTE — Progress Notes (Signed)
Patient reports continued weakness today.

## 2016-12-05 ENCOUNTER — Ambulatory Visit (INDEPENDENT_AMBULATORY_CARE_PROVIDER_SITE_OTHER): Payer: PPO | Admitting: General Surgery

## 2016-12-05 ENCOUNTER — Inpatient Hospital Stay: Payer: Self-pay

## 2016-12-05 ENCOUNTER — Encounter: Payer: Self-pay | Admitting: General Surgery

## 2016-12-05 ENCOUNTER — Telehealth: Payer: Self-pay | Admitting: *Deleted

## 2016-12-05 VITALS — BP 132/82 | HR 62 | Resp 14 | Ht 69.0 in | Wt 141.6 lb

## 2016-12-05 DIAGNOSIS — R921 Mammographic calcification found on diagnostic imaging of breast: Secondary | ICD-10-CM

## 2016-12-05 DIAGNOSIS — C50412 Malignant neoplasm of upper-outer quadrant of left female breast: Secondary | ICD-10-CM

## 2016-12-05 DIAGNOSIS — Z170 Estrogen receptor positive status [ER+]: Secondary | ICD-10-CM

## 2016-12-05 DIAGNOSIS — R634 Abnormal weight loss: Secondary | ICD-10-CM

## 2016-12-05 NOTE — Telephone Encounter (Signed)
Called patient on her cell phone but no answer and not able to leave a message.   We need to inform patient of her mammogram that is scheduled and arrange a date for surgery after mammogram date.

## 2016-12-05 NOTE — Progress Notes (Signed)
Patient ID: Sandra Fritz, female   DOB: [birth year], 82 y.o.   MRN: [id number]  Chief Complaint  Patient presents with  . Follow-up    breast cancer    HPI Sandra Fritz is a 82 y.o. female with left breast cancer here to discuss surgery options. Her last chemo treatment was 3 weeks ago. She is here today with her sister Tye [place].   She reports that she is dizzy when she gets up out of a chair, this gets better with standing. This started when she started chemotherapy. This has improved since she finished chemotherapy. She reports weakness in her legs also.  She had a bad fall in May with a concussion and broken jaw. This occurred the day after her power port was placed.   HPI  Past Medical History:  Diagnosis Date  . Anemia   . Anxiety   . Arthritis   . Blood transfusion without reported diagnosis   . Cataract   . Complication of anesthesia    HARD TO WAKE UP AFTER TONSILLECTOMY  . GERD (gastroesophageal reflux disease)   . Hyperlipidemia   . Hypertension   . Neuromuscular disorder ([place])   . Primary cancer of upper outer quadrant of left female breast ([place]) 07/28/2016   2.7 cm, T2, ER/PR +, her 2 neu negative prior to neoadjuvant chemotherapy    Past Surgical History:  Procedure Laterality Date  . APPENDECTOMY  1955   SECONDARY TO RUTURED APPENDIX  . CATARACT EXTRACTION, BILATERAL  2011  . COLONOSCOPY  2002  . DILATION AND CURETTAGE OF UTERUS  1989   Dr. Laurey Morale  . JOINT REPLACEMENT     Right hip  . MOLE REMOVAL  1957   18 removed by Dr. Hoyle Sauer  . NECK SURGERY    . PORTACATH PLACEMENT Right 08/05/2016   Procedure: INSERTION PORT-A-CATH WITH ULTRASOUND;  Surgeon: Robert Bellow, MD;  Location: [facility] ORS;  Service: General;  Laterality: Right;  . TONSILLECTOMY  1955    Family History  Problem Relation Age of Onset  . Macular degeneration Mother   . Hypertension Mother   . Diabetes Father   . Heart disease Father   . Heart attack Father 61  . Heart  disease Brother 57  . Dermatomyositis Sister   . Cancer Sister        Carcinoma of the uterine call attached to colon and is in remission now  . Breast cancer Maternal Aunt 70  . Breast cancer Paternal Aunt 2  . Breast cancer Cousin        paternal aunt w/ breast ca daughter  . Breast cancer Maternal Aunt 60    Social History Social History  Substance Use Topics  . Smoking status: Never Smoker  . Smokeless tobacco: Never Used  . Alcohol use 0.6 oz/week    1 Glasses of wine per week     Comment: Maybe 3 per year    Allergies  Allergen Reactions  . Keflex  [Cephalexin]     hives  . Statins     Muscle and joint pain-patient states she tried all statins.  . Erythromycin Rash    ALL MYCINS    Current Outpatient Prescriptions  Medication Sig Dispense Refill  . ALPRAZolam (XANAX) 0.25 MG tablet Take 1 tablet (0.25 mg total) by mouth at bedtime as needed for anxiety. 30 tablet 0  . aspirin 81 MG tablet Take 81 mg by mouth daily.     Marland Kitchen dexamethasone (DECADRON) 4 MG tablet  Take 1 tablet (4 mg total) by mouth 2 (two) times daily with a meal. 60 tablet 0  . esomeprazole (NEXIUM) 20 MG capsule Take 1 capsule (20 mg total) by mouth daily. 90 capsule 3  . fluticasone (FLONASE) 50 MCG/ACT nasal spray Place 1 spray into both nostrils daily. 16 g 2  . furosemide (LASIX) 20 MG tablet Take 1 tablet (20 mg total) by mouth daily. 30 tablet 0  . hydrocortisone 2.5 % cream Apply 1 application topically 2 (two) times daily. ON FACE    . lidocaine-prilocaine (EMLA) cream Apply to affected area once 30 g 3  . loratadine (CLARITIN) 10 MG tablet Take 10 mg by mouth daily as needed.     Marland Kitchen losartan (COZAAR) 100 MG tablet Take 1 tablet (100 mg total) by mouth daily. 90 tablet 3  . magic mouthwash w/lidocaine SOLN Take 5 mLs by mouth 4 (four) times daily as needed for mouth pain. 240 mL 0  . Misc Natural Products (OSTEO BI-FLEX JOINT SHIELD PO) Take 2 tablets by mouth daily.    . Multiple Vitamin  (MULTIVITAMIN WITH MINERALS) TABS tablet Take 1 tablet by mouth 3 (three) times a week.    . ondansetron (ZOFRAN) 8 MG tablet Take 1 tablet (8 mg total) by mouth 2 (two) times daily as needed. Start on the third day after chemotherapy. 60 tablet 2  . prochlorperazine (COMPAZINE) 10 MG tablet Take 1 tablet (10 mg total) by mouth every 6 (six) hours as needed (Nausea or vomiting). 60 tablet 2  . rosuvastatin (CRESTOR) 5 MG tablet Take 1 tablet (5 mg total) by mouth every other day. 45 tablet 3  . traMADol (ULTRAM) 50 MG tablet Take 1 tablet (50 mg total) by mouth 2 (two) times daily as needed. 60 tablet 5   Current Facility-Administered Medications  Medication Dose Route Frequency Provider Last Rate Last Dose  . sodium chloride 0.9 % 1,000 mL infusion   Intravenous Continuous Jacquelin Hawking, NP 999 mL/hr at 11/01/16 1500      Review of Systems Review of Systems  Constitutional: Positive for fatigue and unexpected weight change. Negative for activity change, appetite change, chills and diaphoresis.  Respiratory: Negative.   Cardiovascular: Negative.     Blood pressure 132/82, pulse 62, resp. rate 14, height 5\' 9"  (1.753 m), weight 141 lb 9.6 oz (64.2 kg).  Physical Exam Physical Exam  Constitutional: She is oriented to person, place, and time. She appears well-developed and well-nourished.  HENT:  Head:    Mouth/Throat: Mucous membranes are normal.  Eyes: Conjunctivae are normal. No scleral icterus.  Neck: Neck supple.  Cardiovascular: Normal rate, regular rhythm, normal heart sounds and normal pulses.   Pulmonary/Chest: Effort normal and breath sounds normal. Right breast exhibits no inverted nipple, no mass, no nipple discharge, no skin change and no tenderness. Left breast exhibits no inverted nipple, no mass, no nipple discharge, no skin change and no tenderness.    Lymphadenopathy:    She has no cervical adenopathy.    She has no axillary adenopathy.  Neurological: She is  alert and oriented to person, place, and time.  Skin: Skin is warm and dry.  Psychiatric: She has a normal mood and affect.    Data Reviewed CT, brain MRI, cervical spine CT of May-July 2018.  Ultrasound examination of the left breast was completed with an eye towards possible breast conservation. In the 3:00 position, 1 cm from the nipple there remains a hypoechoic mass measuring 0.48 x  0.5 x 0.53 cm. This is significantly decreased in size from her 07/25/2016 exam when the area measuring up to 1.93 cm in size. This lies within 1 cm of the overlying skin, just under the areola. BI-RADS-6.  Examination of the axilla showed 2 normal appearing lymph nodes measuring up to 0.67 cm in diameter.  Bilateral diagnostic mammogram dated 07/15/2016 showed 2 adjacent groups of indeterminate right breast microcalcifications. Stereotactic biopsy of 2 areas were recommended. 07/07/2016.  Laboratory studies of 11/29/2016 showed a potassium of 3.4, creatinine of 1.15 with an estimated GFR 44, normal liver function studies.  Hemoglobin 10.9 with an MCV of 92, white blood cell count 6900. Platelet count 328,000. Hemoglobin one year ago 11.8.  Assessment    Very good response to neoadjuvant chemotherapy.  Weight loss and weakness secondary to chemotherapy.  Microcalcifications in the right breast new from 2016.    Plan    We had a 45 minute discuss and reviewing options for management of the left breast. The area of the malignancy is very close to the nipple areolar complex, and while it is not guaranteed that this area will be sacrificed this is certainly a possibility. She is under age 10, and post surgical radiation therapy would be recommended as standard treatment. After long reflection she has decided to proceed to mastectomy on the left side rather than breast conservation.  We had along discussion regards to the right breast microcalcifications and options for management. A repeat mammogram, now 6  months out from her original study to determine if they're progressing which would strongly recommend biopsy for the possibility of DCIS more than invasive cancer versus simple observation until April 2019. Pros and cons of both approaches reviewed. Repeat review of the April 2018 mammograms suggest she could have developed DCIS in the right breast, but this has been outweighed by the invasive cancer in the left breast.    At this time, the patient wanted to proceed to repeat mammography, although at present she plans to defer any biopsy until after management of the left breast cancer.  The patient was encouraged to make use of her EMLA cream prior to presentation for her sentinel node biopsy to minimize local discomfort.  HPI, Physical Exam, Assessment and Plan have been scribed under the direction and in the presence of Robert Bellow, MD  Concepcion Living, LPN   I have completed the exam and reviewed the above documentation for accuracy and completeness.  I agree with the above.  Haematologist has been used and any errors in dictation or transcription are unintentional.  Hervey Ard, M.D., F.A.C.S.   Robert Bellow 12/06/2016, 9:11 PM

## 2016-12-06 ENCOUNTER — Encounter: Payer: Self-pay | Admitting: General Surgery

## 2016-12-06 DIAGNOSIS — R634 Abnormal weight loss: Secondary | ICD-10-CM | POA: Insufficient documentation

## 2016-12-06 DIAGNOSIS — R921 Mammographic calcification found on diagnostic imaging of breast: Secondary | ICD-10-CM | POA: Insufficient documentation

## 2016-12-07 NOTE — Telephone Encounter (Signed)
Patient was contacted today and notified of mammogram date. She verbalizes understanding.   The patient would like to proceed with left mastectomy and SLN biopsy on 12-30-16. Patient will be contacted once this is scheduled with [facility] to notify her of arrival time day of surgery.

## 2016-12-08 NOTE — Telephone Encounter (Signed)
Patient was contacted today and notified of surgery date, time, and instructions.   She verbalizes understanding.   The patient was instructed to call the office if she has further questions.

## 2016-12-15 ENCOUNTER — Telehealth: Payer: Self-pay | Admitting: *Deleted

## 2016-12-15 NOTE — Telephone Encounter (Signed)
Caryl Pina from the pre-admission testing department called the office to ask if they could change phone interview to a pre-admission office visit due to the number of medications that the patient is taking.   I told Caryl Pina that patient recently had surgery in May of this year which is why Dr. Bary Castilla thought patient could phone interview.   Caryl Pina still requested that the patient come in. An appointment was scheduled for 12-23-16 at 11 am. Patient notified and verbalizes understanding.

## 2016-12-19 ENCOUNTER — Other Ambulatory Visit: Payer: Self-pay | Admitting: General Surgery

## 2016-12-19 DIAGNOSIS — C50011 Malignant neoplasm of nipple and areola, right female breast: Secondary | ICD-10-CM

## 2016-12-22 ENCOUNTER — Ambulatory Visit
Admission: RE | Admit: 2016-12-22 | Discharge: 2016-12-22 | Disposition: A | Discharge status: Home / Self Care | Payer: PPO | Source: Ambulatory Visit | Attending: General Surgery | Admitting: General Surgery

## 2016-12-22 DIAGNOSIS — R921 Mammographic calcification found on diagnostic imaging of breast: Secondary | ICD-10-CM | POA: Diagnosis not present

## 2016-12-22 HISTORY — DX: Personal history of antineoplastic chemotherapy: Z92.21

## 2016-12-22 HISTORY — DX: Malignant neoplasm of unspecified site of unspecified female breast: C50.919

## 2016-12-23 ENCOUNTER — Inpatient Hospital Stay: Admission: RE | Admit: 2016-12-23 | Payer: PPO | Source: Ambulatory Visit

## 2016-12-23 ENCOUNTER — Encounter
Admission: RE | Admit: 2016-12-23 | Discharge: 2016-12-23 | Disposition: A | Discharge status: Home / Self Care | Payer: PPO | Source: Ambulatory Visit | Attending: General Surgery | Admitting: General Surgery

## 2016-12-23 DIAGNOSIS — K219 Gastro-esophageal reflux disease without esophagitis: Secondary | ICD-10-CM | POA: Diagnosis not present

## 2016-12-23 DIAGNOSIS — Z79899 Other long term (current) drug therapy: Secondary | ICD-10-CM | POA: Insufficient documentation

## 2016-12-23 DIAGNOSIS — Z9221 Personal history of antineoplastic chemotherapy: Secondary | ICD-10-CM | POA: Insufficient documentation

## 2016-12-23 DIAGNOSIS — D649 Anemia, unspecified: Secondary | ICD-10-CM | POA: Insufficient documentation

## 2016-12-23 DIAGNOSIS — E785 Hyperlipidemia, unspecified: Secondary | ICD-10-CM | POA: Insufficient documentation

## 2016-12-23 DIAGNOSIS — Z96641 Presence of right artificial hip joint: Secondary | ICD-10-CM | POA: Insufficient documentation

## 2016-12-23 DIAGNOSIS — R634 Abnormal weight loss: Secondary | ICD-10-CM | POA: Diagnosis not present

## 2016-12-23 DIAGNOSIS — Z7982 Long term (current) use of aspirin: Secondary | ICD-10-CM | POA: Insufficient documentation

## 2016-12-23 DIAGNOSIS — G709 Myoneural disorder, unspecified: Secondary | ICD-10-CM | POA: Diagnosis not present

## 2016-12-23 DIAGNOSIS — Z8249 Family history of ischemic heart disease and other diseases of the circulatory system: Secondary | ICD-10-CM | POA: Insufficient documentation

## 2016-12-23 DIAGNOSIS — Z833 Family history of diabetes mellitus: Secondary | ICD-10-CM | POA: Insufficient documentation

## 2016-12-23 DIAGNOSIS — F419 Anxiety disorder, unspecified: Secondary | ICD-10-CM | POA: Diagnosis not present

## 2016-12-23 DIAGNOSIS — Z01812 Encounter for preprocedural laboratory examination: Secondary | ICD-10-CM | POA: Insufficient documentation

## 2016-12-23 DIAGNOSIS — Z9889 Other specified postprocedural states: Secondary | ICD-10-CM | POA: Insufficient documentation

## 2016-12-23 DIAGNOSIS — Z803 Family history of malignant neoplasm of breast: Secondary | ICD-10-CM | POA: Insufficient documentation

## 2016-12-23 DIAGNOSIS — Z79891 Long term (current) use of opiate analgesic: Secondary | ICD-10-CM | POA: Diagnosis not present

## 2016-12-23 DIAGNOSIS — M1990 Unspecified osteoarthritis, unspecified site: Secondary | ICD-10-CM | POA: Diagnosis not present

## 2016-12-23 DIAGNOSIS — Z888 Allergy status to other drugs, medicaments and biological substances status: Secondary | ICD-10-CM | POA: Insufficient documentation

## 2016-12-23 DIAGNOSIS — I10 Essential (primary) hypertension: Secondary | ICD-10-CM | POA: Insufficient documentation

## 2016-12-23 DIAGNOSIS — R531 Weakness: Secondary | ICD-10-CM | POA: Insufficient documentation

## 2016-12-23 DIAGNOSIS — R921 Mammographic calcification found on diagnostic imaging of breast: Secondary | ICD-10-CM | POA: Diagnosis not present

## 2016-12-23 LAB — CBC
HCT: 34.1 % — ABNORMAL LOW (ref 35.0–47.0)
HEMOGLOBIN: 11.4 g/dL — AB (ref 12.0–16.0)
MCH: 31.9 pg (ref 26.0–34.0)
MCHC: 33.5 g/dL (ref 32.0–36.0)
MCV: 95.1 fL (ref 80.0–100.0)
PLATELETS: 198 10*3/uL (ref 150–440)
RBC: 3.59 MIL/uL — ABNORMAL LOW (ref 3.80–5.20)
RDW: 16.1 % — AB (ref 11.5–14.5)
WBC: 5 10*3/uL (ref 3.6–11.0)

## 2016-12-23 LAB — POTASSIUM: POTASSIUM: 3.2 mmol/L — AB (ref 3.5–5.1)

## 2016-12-23 NOTE — Pre-Procedure Instructions (Signed)
Potassium results sent to Dr. Bary Castilla and Anesthesia for review.

## 2016-12-23 NOTE — Patient Instructions (Addendum)
Your procedure is scheduled on: Friday 12/30/16 Report to [facility]. 2ND FLOOR MEDICAL MALL ENTRANCE. To find out your arrival time please call [phone] between 1PM - 3PM on Thursday 12/29/16.  Remember: Instructions that are not followed completely may result in serious medical risk, up to and including death, or upon the discretion of your surgeon and anesthesiologist your surgery may need to be rescheduled.    __X__ 1. Do not eat anything after midnight the night before your    procedure.  No gum chewing or hard candies.  You may drink clear   liquids up to 2 hours before you are scheduled to arrive at the   hospital for your procedure. Do not drink clear liquids within 2   hours of scheduled arrival to the hospital as this may lead to your   procedure being delayed or rescheduled.       Clear liquids include:   Water or Apple juice without pulp   Clear carbohydrate beverage such as Clearfast or Gatorade   Black coffee or Clear Tea (no milk, no creamer, do not add anything   to the coffee or tea)    Diabetics should only drink water   __X__ 2. No Alcohol for 24 hours before or after surgery.   ____ 3. Bring all medications with you on the day of surgery if instructed.    __X__ 4. Notify your doctor if there is any change in your medical condition     (cold, fever, infections).             __X___5. No smoking within 24 hours of your surgery.     Do not wear jewelry, make-up, hairpins, clips or nail polish.  Do not wear lotions, powders, or perfumes.   Do not shave 48 hours prior to surgery. Men may shave face and neck.  Do not bring valuables to the hospital.    [facility] is not responsible for any belongings or valuables.               Contacts, dentures or bridgework may not be worn into surgery.  Leave your suitcase in the car. After surgery it may be brought to your room.  For patients admitted to the hospital, discharge time is determined by your                 treatment team.   Patients discharged the day of surgery will not be allowed to drive home.   Please read over the following fact sheets that you were given:   MRSA Information   __x__ Take these medicines the morning of surgery with A SIP OF WATER:    1. nexium  2. Rosuvastatin if your "on" day to take  3.   4.  5.  6.  ____ Fleet Enema (as directed)   __x__ Use CHG Soap/SAGE wipes as directed  ____ Use inhalers on the day of surgery  ____ Stop metformin 2 days prior to surgery    ____ Take 1/2 of usual insulin dose the night before surgery and none on the morning of surgery.   __x__ Stop Coumadin/Plavix/aspirin on today  __X__ Stop Anti-inflammatories such as Advil, Aleve, Ibuprofen, Motrin, Naproxen, Naprosyn, Goodies,powder, or aspirin products.  OK to take Tylenol.   __X__ Stop supplements, Vitamin E, Fish Oil until after surgery.    ____ Bring C-Pap to the hospital.

## 2016-12-25 NOTE — Progress Notes (Signed)
[place]  Telephone:([phone]) [phone] Fax:(336) [phone]  ID: [id number] OB: 11-19-1934  MR#: [id number]  PYY#:[id number]  Patient Care Team: Jerrol Banana., MD as PCP - General (Unknown Physician Specialty) Jerrol Banana., MD (Family Medicine) Bary Castilla Forest Gleason, MD (General Surgery)  CHIEF COMPLAINT: Clinical stage Ib ER/PR positive, HER-2 negative invasive carcinoma of the upper outer quadrant of the left breast.  INTERVAL HISTORY: Patient returns to clinic today for repeat laboratory work and further evaluation. She has worsening pitting edema in her bilateral lower extremities making it difficult to sleep. She continues to have weakness and fatigue, but this is improved. She states her peripheral neuropathy has resolved. She continues to have insomnia. She denies any recent fevers or illnesses. She has no chest pain or shortness of breath. She denies any vomiting, constipation, or diarrhea. She has no urinary complaints. Patient offers no further specific complaints.  REVIEW OF SYSTEMS:   Review of Systems  Constitutional: Positive for malaise/fatigue. Negative for fever and weight loss.  HENT: Negative for sore throat.   Eyes: Negative for blurred vision, double vision and pain.  Respiratory: Negative.  Negative for cough and shortness of breath.   Cardiovascular: Positive for leg swelling. Negative for chest pain.  Gastrointestinal: Negative.  Negative for abdominal pain.  Genitourinary: Negative.   Musculoskeletal: Negative.   Skin: Negative.  Negative for rash.  Neurological: Positive for weakness. Negative for tingling, sensory change and headaches.  Psychiatric/Behavioral: The patient has insomnia. The patient is not nervous/anxious.     As per HPI. Otherwise, a complete review of systems is negative.  PAST MEDICAL HISTORY: Past Medical History:  Diagnosis Date  . Anemia   . Anxiety   . Arthritis   . Blood transfusion without  reported diagnosis   . Breast cancer ([place])   . Cataract   . Complication of anesthesia    HARD TO WAKE UP AFTER TONSILLECTOMY  . GERD (gastroesophageal reflux disease)   . Hyperlipidemia   . Hypertension   . Neuromuscular disorder ([place])   . Personal history of chemotherapy   . Primary cancer of upper outer quadrant of left female breast ([place]) 07/28/2016   2.7 cm, T2, ER/PR +, her 2 neu negative prior to neoadjuvant chemotherapy    PAST SURGICAL HISTORY: Past Surgical History:  Procedure Laterality Date  . APPENDECTOMY  1955   SECONDARY TO RUTURED APPENDIX  . CATARACT EXTRACTION, BILATERAL  2011  . COLONOSCOPY  2002  . DILATION AND CURETTAGE OF UTERUS  1989   Dr. Laurey Morale  . JOINT REPLACEMENT     Right hip  . MOLE REMOVAL  1957   18 removed by Dr. Hoyle Sauer  . NECK SURGERY    . PORTACATH PLACEMENT Right 08/05/2016   Procedure: INSERTION PORT-A-CATH WITH ULTRASOUND;  Surgeon: Robert Bellow, MD;  Location: [facility] ORS;  Service: General;  Laterality: Right;  . TONSILLECTOMY  1955    FAMILY HISTORY: Family History  Problem Relation Age of Onset  . Macular degeneration Mother   . Hypertension Mother   . Diabetes Father   . Heart disease Father   . Heart attack Father 10  . Heart disease Brother 69  . Dermatomyositis Sister   . Cancer Sister        Carcinoma of the uterine call attached to colon and is in remission now  . Breast cancer Maternal Aunt 70  . Breast cancer Paternal Aunt 59  . Breast cancer Cousin  paternal aunt w/ breast ca daughter  . Breast cancer Maternal Aunt 60    ADVANCED DIRECTIVES (Y/N):  N  HEALTH MAINTENANCE: Social History  Substance Use Topics  . Smoking status: Never Smoker  . Smokeless tobacco: Never Used  . Alcohol use 0.6 oz/week    1 Glasses of wine per week     Comment: Maybe 3 per year     Colonoscopy:  PAP:  Bone density:  Lipid panel:  Allergies  Allergen Reactions  . Keflex [Cephalexin] Hives  . Statins Other  (See Comments)    Muscle and joint pain-patient states she tried all statins.  . Erythromycin Rash and Other (See Comments)    ALL MYCINS    Current Outpatient Prescriptions  Medication Sig Dispense Refill  . ALPRAZolam (XANAX) 0.25 MG tablet Take 1 tablet (0.25 mg total) by mouth at bedtime as needed for anxiety. 30 tablet 0  . aspirin 81 MG tablet Take 81 mg by mouth daily.     Marland Kitchen dexamethasone (DECADRON) 4 MG tablet Take 1 tablet (4 mg total) by mouth 2 (two) times daily with a meal. (Patient not taking: Reported on 12/21/2016) 60 tablet 0  . Docusate Calcium (STOOL SOFTENER PO) Take 2 capsules by mouth daily.    Marland Kitchen esomeprazole (NEXIUM) 20 MG capsule Take 1 capsule (20 mg total) by mouth daily. 90 capsule 3  . fluticasone (FLONASE) 50 MCG/ACT nasal spray Place 1 spray into both nostrils daily. (Patient not taking: Reported on 12/21/2016) 16 g 2  . furosemide (LASIX) 20 MG tablet Take 1 tablet (20 mg total) by mouth daily. (Patient taking differently: Take 20 mg by mouth 2 (two) times daily. ) 30 tablet 0  . lidocaine-prilocaine (EMLA) cream Apply to affected area once 30 g 3  . losartan (COZAAR) 100 MG tablet Take 1 tablet (100 mg total) by mouth daily. 90 tablet 3  . magic mouthwash w/lidocaine SOLN Take 5 mLs by mouth 4 (four) times daily as needed for mouth pain. 240 mL 0  . Misc Natural Products (OSTEO BI-FLEX JOINT SHIELD PO) Take 2 tablets by mouth daily.    . Multiple Vitamin (MULTIVITAMIN WITH MINERALS) TABS tablet Take 1 tablet by mouth daily.     . ondansetron (ZOFRAN) 8 MG tablet Take 1 tablet (8 mg total) by mouth 2 (two) times daily as needed. Start on the third day after chemotherapy. 60 tablet 2  . POTASSIUM PO Take 2 tablets by mouth daily.    . prochlorperazine (COMPAZINE) 10 MG tablet Take 1 tablet (10 mg total) by mouth every 6 (six) hours as needed (Nausea or vomiting). (Patient taking differently: Take 10 mg by mouth every 6 (six) hours as needed for nausea or vomiting. )  60 tablet 2  . rosuvastatin (CRESTOR) 5 MG tablet Take 1 tablet (5 mg total) by mouth every other day. 45 tablet 3  . traMADol (ULTRAM) 50 MG tablet Take 1 tablet (50 mg total) by mouth 2 (two) times daily as needed. 60 tablet 5   Current Facility-Administered Medications  Medication Dose Route Frequency Provider Last Rate Last Dose  . sodium chloride 0.9 % 1,000 mL infusion   Intravenous Continuous Jacquelin Hawking, NP 999 mL/hr at 11/01/16 1500      OBJECTIVE: There were no vitals filed for this visit.   There is no height or weight on file to calculate BMI.    ECOG FS:1 - Symptomatic but completely ambulatory  General: Well-developed, well-nourished, no acute distress. Eyes:  Pink conjunctiva, anicteric sclera.   Breasts: Palpable left breast mass, significantly decreased in size from initial exam. HEENT: Clear oropharynx. Lungs: Clear to auscultation bilaterally. Heart: Regular rate and rhythm. No rubs, murmurs, or gallops. Abdomen: Soft, nontender, nondistended. No organomegaly noted, normoactive bowel sounds. Musculoskeletal: 2-3+ bilateral edema. Neuro: Alert, answering all questions appropriately. Cranial nerves grossly intact. Skin: No rashes or petechiae noted. Psych: Normal affect.   LAB RESULTS:  Lab Results  Component Value Date   NA 135 11/29/2016   K 3.2 (L) 12/23/2016   CL 95 (L) 11/29/2016   CO2 30 11/29/2016   GLUCOSE 112 (H) 11/29/2016   BUN 27 (H) 11/29/2016   CREATININE 1.15 (H) 11/29/2016   CALCIUM 9.6 11/29/2016   PROT 6.6 11/29/2016   ALBUMIN 3.9 11/29/2016   AST 31 11/29/2016   ALT 20 11/29/2016   ALKPHOS 47 11/29/2016   BILITOT 0.6 11/29/2016   GFRNONAA 44 (L) 11/29/2016   GFRAA 51 (L) 11/29/2016    Lab Results  Component Value Date   WBC 5.0 12/23/2016   NEUTROABS 5.6 11/29/2016   HGB 11.4 (L) 12/23/2016   HCT 34.1 (L) 12/23/2016   MCV 95.1 12/23/2016   PLT 198 12/23/2016     STUDIES: US Breast Complete Uni Left Inc  Axilla  Result Date: 12/06/2016 Ultrasound examination of the left breast was completed with an eye towards possible breast conservation. In the 3:00 position, 1 cm from the nipple there remains a hypoechoic mass measuring 0.48 x 0.5 x 0.53 cm. This is significantly decreased in size from her 07/25/2016 exam when the area measuring up to 1.93 cm in size. This lies within 1 cm of the overlying skin, just under the areola. BI-RADS-6. Examination of the axilla showed 2 normal appearing lymph nodes measuring up to 0.67 cm in diameter.   Mm Diag Breast Tomo Uni Right  Result Date: 12/22/2016 CLINICAL DATA:  Followup 2 groups of right breast indeterminate calcifications, previously recommended for stereotactic guided core needle biopsy. The biopsies were not performed. Undergoing neoadjuvant chemotherapy for left breast cancer. EXAM: 2D DIGITAL DIAGNOSTIC UNILATERAL RIGHT MAMMOGRAM WITH CAD AND ADJUNCT TOMO COMPARISON:  Previous exam(s). ACR Breast Density Category b: There are scattered areas of fibroglandular density. FINDINGS: The 4 mm group of coarse heterogeneous calcifications in the anterior right breast centrally have not changed significantly since 07/15/2016. The 4 mm group of calcifications in the anterior 6 o'clock position of the right breast currently demonstrate dependent layering in the true lateral projection. No findings elsewhere in the right breast suspicious for malignancy. Mammographic images were processed with CAD. IMPRESSION: 1. Stable 4 mm group of coarse, heterogeneous, indeterminate calcifications in the anterior right breast centrally. 2. The 4 mm group of calcifications in the anterior 6 o'clock position of the right breast currently have mammographic features of benign milk of calcium. RECOMMENDATION: Stereotactic guided core needle biopsy of the 4 mm group of indeterminate calcifications in the anterior right breast centrally. I have discussed the findings and recommendations with the  patient. Results were also provided in writing at the conclusion of the visit. If applicable, a reminder letter will be sent to the patient regarding the next appointment. BI-RADS CATEGORY  4: Suspicious. Electronically Signed   By: Claudie Revering M.D.   On: 12/22/2016 15:55    ASSESSMENT: Clinical stage Ib ER/PR positive, HER-2 negative invasive carcinoma of the upper outer quadrant of the left breast.  PLAN:    1. Clinical stage Ib ER/PR positive, HER-2 negative  invasive carcinoma of the upper outer quadrant of the left breast: Patient completed 4 cycles of Adriamycin and Cytoxan on October 14, 2016. She only received 3 cycles of weekly Taxol, her last one on November 08, 2016. Given her worsening performance status and peripheral neuropathy treatment was discontinued altogether. A referral was made back to surgery for consideration of possible lumpectomy which is scheduled for later this week. Patient will also require adjuvant XRT if she pursues lumpectomy. Finally, given the ER/PR status of her tumor she will benefit from an aromatase inhibitor for 5 years. Return to clinic in 2 weeks for further evaluation  2. Edema: Patient was instructed to increase her Lasix dose to 20 mg twice per day. Return to clinic as above. 3. Hypokalemia: Patient was given a prescription for oral potassium today. She does not require IV potassium at this time. 4. Subdural hematoma: Secondary to fall. Monitor. 5. Anemia: Mild, monitor. 6. Renal insufficiency:  Mild, monitor. Patient declined IV fluids today 7. Peripheral neuropathy: Resolved. Taxol has been discontinued. Patient canceled her appointment with neurology.   Patient expressed understanding and was in agreement with this plan. She also understands that She can call clinic at any time with any questions, concerns, or complaints.   Cancer Staging Primary cancer of upper outer quadrant of left female breast [facility]) Staging form: Breast, AJCC 8th Edition - Clinical  stage from 07/28/2016: Stage IB (cT2, cN0, cM0, G2, ER: Positive, PR: Positive, HER2: Negative) - Signed by Lloyd Huger, MD on 07/28/2016   Lloyd Huger, MD   12/25/2016 9:21 AM

## 2016-12-26 ENCOUNTER — Telehealth: Payer: Self-pay

## 2016-12-26 NOTE — Telephone Encounter (Signed)
Unable to leave message for the patient.

## 2016-12-26 NOTE — Telephone Encounter (Signed)
-----   Message from Robert Bellow, MD sent at 12/26/2016  3:00 PM EDT ----- Please ask the patient to take her potassium supplement three times a day between now and surgery. . Also, find out what strength tablet she is taking.  ----- Message ----- From: Buel Ream, Lab In [place] Sent: 12/23/2016   3:33 PM To: Robert Bellow, MD

## 2016-12-27 ENCOUNTER — Inpatient Hospital Stay: Payer: PPO | Attending: Oncology

## 2016-12-27 ENCOUNTER — Inpatient Hospital Stay (HOSPITAL_BASED_OUTPATIENT_CLINIC_OR_DEPARTMENT_OTHER): Payer: PPO | Admitting: Oncology

## 2016-12-27 VITALS — BP 128/69 | HR 89 | Temp 99.0°F | Resp 18 | Wt 152.1 lb

## 2016-12-27 DIAGNOSIS — W19XXXS Unspecified fall, sequela: Secondary | ICD-10-CM

## 2016-12-27 DIAGNOSIS — E785 Hyperlipidemia, unspecified: Secondary | ICD-10-CM | POA: Diagnosis not present

## 2016-12-27 DIAGNOSIS — E876 Hypokalemia: Secondary | ICD-10-CM | POA: Diagnosis not present

## 2016-12-27 DIAGNOSIS — S065X9S Traumatic subdural hemorrhage with loss of consciousness of unspecified duration, sequela: Secondary | ICD-10-CM | POA: Diagnosis not present

## 2016-12-27 DIAGNOSIS — N2889 Other specified disorders of kidney and ureter: Secondary | ICD-10-CM

## 2016-12-27 DIAGNOSIS — F419 Anxiety disorder, unspecified: Secondary | ICD-10-CM | POA: Diagnosis not present

## 2016-12-27 DIAGNOSIS — G4700 Insomnia, unspecified: Secondary | ICD-10-CM | POA: Insufficient documentation

## 2016-12-27 DIAGNOSIS — D649 Anemia, unspecified: Secondary | ICD-10-CM | POA: Diagnosis not present

## 2016-12-27 DIAGNOSIS — K219 Gastro-esophageal reflux disease without esophagitis: Secondary | ICD-10-CM

## 2016-12-27 DIAGNOSIS — Z170 Estrogen receptor positive status [ER+]: Secondary | ICD-10-CM | POA: Insufficient documentation

## 2016-12-27 DIAGNOSIS — Z9221 Personal history of antineoplastic chemotherapy: Secondary | ICD-10-CM | POA: Diagnosis not present

## 2016-12-27 DIAGNOSIS — R5383 Other fatigue: Secondary | ICD-10-CM | POA: Insufficient documentation

## 2016-12-27 DIAGNOSIS — I10 Essential (primary) hypertension: Secondary | ICD-10-CM

## 2016-12-27 DIAGNOSIS — C50412 Malignant neoplasm of upper-outer quadrant of left female breast: Secondary | ICD-10-CM | POA: Insufficient documentation

## 2016-12-27 DIAGNOSIS — Z803 Family history of malignant neoplasm of breast: Secondary | ICD-10-CM | POA: Diagnosis not present

## 2016-12-27 DIAGNOSIS — R609 Edema, unspecified: Secondary | ICD-10-CM

## 2016-12-27 DIAGNOSIS — Z79899 Other long term (current) drug therapy: Secondary | ICD-10-CM

## 2016-12-27 DIAGNOSIS — Z7982 Long term (current) use of aspirin: Secondary | ICD-10-CM | POA: Insufficient documentation

## 2016-12-27 DIAGNOSIS — R531 Weakness: Secondary | ICD-10-CM | POA: Diagnosis not present

## 2016-12-27 LAB — CBC WITH DIFFERENTIAL/PLATELET
BASOS PCT: 1 %
Basophils Absolute: 0 10*3/uL (ref 0–0.1)
EOS ABS: 0 10*3/uL (ref 0–0.7)
EOS PCT: 1 %
HCT: 31.5 % — ABNORMAL LOW (ref 35.0–47.0)
HEMOGLOBIN: 10.3 g/dL — AB (ref 12.0–16.0)
Lymphocytes Relative: 14 %
Lymphs Abs: 0.4 10*3/uL — ABNORMAL LOW (ref 1.0–3.6)
MCH: 31.4 pg (ref 26.0–34.0)
MCHC: 32.6 g/dL (ref 32.0–36.0)
MCV: 96.1 fL (ref 80.0–100.0)
Monocytes Absolute: 0.2 10*3/uL (ref 0.2–0.9)
Monocytes Relative: 6 %
NEUTROS PCT: 78 %
Neutro Abs: 2.4 10*3/uL (ref 1.4–6.5)
PLATELETS: 131 10*3/uL — AB (ref 150–440)
RBC: 3.28 MIL/uL — AB (ref 3.80–5.20)
RDW: 15.9 % — ABNORMAL HIGH (ref 11.5–14.5)
WBC: 3 10*3/uL — AB (ref 3.6–11.0)

## 2016-12-27 LAB — COMPREHENSIVE METABOLIC PANEL
ALBUMIN: 3 g/dL — AB (ref 3.5–5.0)
ALT: 32 U/L (ref 14–54)
ANION GAP: 9 (ref 5–15)
AST: 35 U/L (ref 15–41)
Alkaline Phosphatase: 46 U/L (ref 38–126)
BUN: 14 mg/dL (ref 6–20)
CHLORIDE: 99 mmol/L — AB (ref 101–111)
CO2: 30 mmol/L (ref 22–32)
Calcium: 8.9 mg/dL (ref 8.9–10.3)
Creatinine, Ser: 0.99 mg/dL (ref 0.44–1.00)
GFR calc Af Amer: >60 mL/min (ref >60)
GFR calc non Af Amer: 53 mL/min — ABNORMAL LOW (ref >60)
GLUCOSE: 123 mg/dL — AB (ref 65–99)
POTASSIUM: 3.6 mmol/L (ref 3.5–5.1)
Sodium: 138 mmol/L (ref 135–145)
TOTAL PROTEIN: 5.6 g/dL — AB (ref 6.5–8.1)
Total Bilirubin: 0.5 mg/dL (ref 0.3–1.2)

## 2016-12-27 MED ORDER — POTASSIUM CHLORIDE CRYS ER 20 MEQ PO TBCR
20.0000 meq | EXTENDED_RELEASE_TABLET | Freq: Every day | ORAL | 0 refills | Status: DC
Start: 2016-12-27 — End: 2017-01-17

## 2016-12-27 NOTE — Progress Notes (Signed)
Pt in for 1 month follow up.  Pt has pitting edema in both feet and ankles.  Pt reports taking lasix 20mg  qd.  Pt states swelling in feet is affecting her sleep at night.  States can be very painful.

## 2016-12-27 NOTE — Telephone Encounter (Signed)
Spoke with patient and she says that she does not take an oral potassium supplement. She has been trying to increase her potassium thru diet.  Dr Bary Castilla notified and instructed to send in Potassium 20 meq. 1 PO TID #30. I called this into [facility] at the patient's pharmacy.  Patient notified to take this medication three times daily until surgery.

## 2016-12-30 ENCOUNTER — Ambulatory Visit
Admission: RE | Admit: 2016-12-30 | Discharge: 2016-12-30 | Disposition: A | Discharge status: Home / Self Care | Payer: PPO | Source: Ambulatory Visit | Attending: General Surgery | Admitting: General Surgery

## 2016-12-30 ENCOUNTER — Encounter: Payer: Self-pay | Admitting: *Deleted

## 2016-12-30 ENCOUNTER — Ambulatory Visit: Payer: PPO | Admitting: Anesthesiology

## 2016-12-30 ENCOUNTER — Encounter
Admission: RE | Disposition: A | Discharge status: Home / Self Care | Payer: Self-pay | Source: Ambulatory Visit | Attending: General Surgery

## 2016-12-30 DIAGNOSIS — Z79899 Other long term (current) drug therapy: Secondary | ICD-10-CM | POA: Insufficient documentation

## 2016-12-30 DIAGNOSIS — Z170 Estrogen receptor positive status [ER+]: Principal | ICD-10-CM

## 2016-12-30 DIAGNOSIS — D759 Disease of blood and blood-forming organs, unspecified: Secondary | ICD-10-CM | POA: Diagnosis not present

## 2016-12-30 DIAGNOSIS — K279 Peptic ulcer, site unspecified, unspecified as acute or chronic, without hemorrhage or perforation: Secondary | ICD-10-CM | POA: Insufficient documentation

## 2016-12-30 DIAGNOSIS — E785 Hyperlipidemia, unspecified: Secondary | ICD-10-CM | POA: Insufficient documentation

## 2016-12-30 DIAGNOSIS — K219 Gastro-esophageal reflux disease without esophagitis: Secondary | ICD-10-CM | POA: Insufficient documentation

## 2016-12-30 DIAGNOSIS — C50412 Malignant neoplasm of upper-outer quadrant of left female breast: Secondary | ICD-10-CM

## 2016-12-30 DIAGNOSIS — Z803 Family history of malignant neoplasm of breast: Secondary | ICD-10-CM | POA: Diagnosis not present

## 2016-12-30 DIAGNOSIS — I10 Essential (primary) hypertension: Secondary | ICD-10-CM | POA: Insufficient documentation

## 2016-12-30 DIAGNOSIS — C50912 Malignant neoplasm of unspecified site of left female breast: Secondary | ICD-10-CM | POA: Diagnosis not present

## 2016-12-30 DIAGNOSIS — C50812 Malignant neoplasm of overlapping sites of left female breast: Secondary | ICD-10-CM | POA: Diagnosis not present

## 2016-12-30 DIAGNOSIS — F419 Anxiety disorder, unspecified: Secondary | ICD-10-CM | POA: Insufficient documentation

## 2016-12-30 DIAGNOSIS — C50011 Malignant neoplasm of nipple and areola, right female breast: Secondary | ICD-10-CM

## 2016-12-30 DIAGNOSIS — D649 Anemia, unspecified: Secondary | ICD-10-CM | POA: Diagnosis not present

## 2016-12-30 HISTORY — PX: MASTECTOMY W/ SENTINEL NODE BIOPSY: SHX2001

## 2016-12-30 HISTORY — PX: MASTECTOMY: SHX3

## 2016-12-30 LAB — POCT I-STAT 4, (NA,K, GLUC, HGB,HCT)
GLUCOSE: 93 mg/dL (ref 65–99)
HCT: 30 % — ABNORMAL LOW (ref 36.0–46.0)
HEMOGLOBIN: 10.2 g/dL — AB (ref 12.0–15.0)
POTASSIUM: 3.7 mmol/L (ref 3.5–5.1)
Sodium: 142 mmol/L (ref 135–145)

## 2016-12-30 SURGERY — MASTECTOMY WITH SENTINEL LYMPH NODE BIOPSY
Anesthesia: General | Laterality: Left | Wound class: Clean

## 2016-12-30 MED ORDER — PROPOFOL 10 MG/ML IV BOLUS
INTRAVENOUS | Status: DC | PRN
  Administered 2016-12-30: 150 mg via INTRAVENOUS

## 2016-12-30 MED ORDER — CLINDAMYCIN PHOSPHATE 600 MG/50ML IV SOLN
INTRAVENOUS | Status: AC
  Filled 2016-12-30: qty 50

## 2016-12-30 MED ORDER — TECHNETIUM TC 99M SULFUR COLLOID FILTERED
1.0000 | Freq: Once | INTRAVENOUS | Status: AC | PRN
  Administered 2016-12-30: 1.08 via INTRADERMAL

## 2016-12-30 MED ORDER — METHYLENE BLUE 0.5 % INJ SOLN
INTRAVENOUS | Status: DC | PRN
  Administered 2016-12-30: 5 mL via SUBMUCOSAL

## 2016-12-30 MED ORDER — ACETAMINOPHEN 10 MG/ML IV SOLN
INTRAVENOUS | Status: AC
  Filled 2016-12-30: qty 100

## 2016-12-30 MED ORDER — DEXAMETHASONE SODIUM PHOSPHATE 10 MG/ML IJ SOLN
INTRAMUSCULAR | Status: AC
  Filled 2016-12-30: qty 1

## 2016-12-30 MED ORDER — LIDOCAINE 2% (20 MG/ML) 5 ML SYRINGE
INTRAMUSCULAR | Status: DC | PRN
  Administered 2016-12-30: 100 mg via INTRAVENOUS

## 2016-12-30 MED ORDER — FENTANYL CITRATE (PF) 250 MCG/5ML IJ SOLN
INTRAMUSCULAR | Status: AC
  Filled 2016-12-30: qty 5

## 2016-12-30 MED ORDER — FENTANYL CITRATE (PF) 100 MCG/2ML IJ SOLN
INTRAMUSCULAR | Status: DC | PRN
  Administered 2016-12-30 (×3): 50 ug via INTRAVENOUS

## 2016-12-30 MED ORDER — DEXAMETHASONE SODIUM PHOSPHATE 10 MG/ML IJ SOLN
INTRAMUSCULAR | Status: DC | PRN
  Administered 2016-12-30: 10 mg via INTRAVENOUS

## 2016-12-30 MED ORDER — ACETAMINOPHEN 10 MG/ML IV SOLN
INTRAVENOUS | Status: DC | PRN
  Administered 2016-12-30: 1000 mg via INTRAVENOUS

## 2016-12-30 MED ORDER — ONDANSETRON HCL 4 MG/2ML IJ SOLN
INTRAMUSCULAR | Status: DC | PRN
  Administered 2016-12-30: 4 mg via INTRAVENOUS

## 2016-12-30 MED ORDER — KETOROLAC TROMETHAMINE 15 MG/ML IJ SOLN
INTRAMUSCULAR | Status: DC | PRN
  Administered 2016-12-30: 15 mg via INTRAVENOUS

## 2016-12-30 MED ORDER — LIDOCAINE HCL (PF) 2 % IJ SOLN
INTRAMUSCULAR | Status: AC
  Filled 2016-12-30: qty 10

## 2016-12-30 MED ORDER — CLINDAMYCIN PHOSPHATE 600 MG/50ML IV SOLN
600.0000 mg | Freq: Once | INTRAVENOUS | Status: AC
  Administered 2016-12-30: 600 mg via INTRAVENOUS

## 2016-12-30 MED ORDER — MIDAZOLAM HCL 2 MG/2ML IJ SOLN
INTRAMUSCULAR | Status: AC
  Filled 2016-12-30: qty 2

## 2016-12-30 MED ORDER — KETOROLAC TROMETHAMINE 30 MG/ML IJ SOLN
INTRAMUSCULAR | Status: AC
  Filled 2016-12-30: qty 1

## 2016-12-30 MED ORDER — FENTANYL CITRATE (PF) 100 MCG/2ML IJ SOLN: 25.0000 ug | INTRAMUSCULAR | Status: DC | PRN

## 2016-12-30 MED ORDER — METHYLENE BLUE 0.5 % INJ SOLN
INTRAVENOUS | Status: AC
  Filled 2016-12-30: qty 10

## 2016-12-30 MED ORDER — ONDANSETRON HCL 4 MG/2ML IJ SOLN: 4.0000 mg | Freq: Once | INTRAMUSCULAR | Status: DC | PRN

## 2016-12-30 MED ORDER — MIDAZOLAM HCL 5 MG/5ML IJ SOLN
INTRAMUSCULAR | Status: DC | PRN
  Administered 2016-12-30: 1 mg via INTRAVENOUS

## 2016-12-30 MED ORDER — BUPIVACAINE-EPINEPHRINE (PF) 0.5% -1:200000 IJ SOLN
INTRAMUSCULAR | Status: AC
  Filled 2016-12-30: qty 30

## 2016-12-30 MED ORDER — LACTATED RINGERS IV SOLN
INTRAVENOUS | Status: DC
  Administered 2016-12-30: 10:00:00 via INTRAVENOUS

## 2016-12-30 MED ORDER — PROPOFOL 10 MG/ML IV BOLUS
INTRAVENOUS | Status: AC
  Filled 2016-12-30: qty 40

## 2016-12-30 SURGICAL SUPPLY — 54 items
APPLIER CLIP 11 MED OPEN (CLIP)
APPLIER CLIP 13 LRG OPEN (CLIP)
APR CLP LRG 13 20 CLIP (CLIP)
APR CLP MED 11 20 MLT OPN (CLIP)
BANDAGE ELASTIC 6 LF NS (GAUZE/BANDAGES/DRESSINGS) ×1 IMPLANT
BINDER BREAST MEDIUM (GAUZE/BANDAGES/DRESSINGS) ×2 IMPLANT
BLADE PHOTON ILLUMINATED (MISCELLANEOUS) ×2 IMPLANT
BLADE SURG 15 STRL SS SAFETY (BLADE) ×3 IMPLANT
BNDG CMPR MED 5X6 ELC HKLP NS (GAUZE/BANDAGES/DRESSINGS)
BNDG GAUZE 4.5X4.1 6PLY STRL (MISCELLANEOUS) ×3 IMPLANT
BULB RESERV EVAC DRAIN JP 100C (MISCELLANEOUS) ×2 IMPLANT
CANISTER SUCT 1200ML W/VALVE (MISCELLANEOUS) ×3 IMPLANT
CHLORAPREP W/TINT 26ML (MISCELLANEOUS) ×3 IMPLANT
CLIP APPLIE 11 MED OPEN (CLIP) IMPLANT
CLIP APPLIE 13 LRG OPEN (CLIP) IMPLANT
CLOSURE WOUND 1/2 X4 (GAUZE/BANDAGES/DRESSINGS)
CNTNR SPEC 2.5X3XGRAD LEK (MISCELLANEOUS) ×1
CONT SPEC 4OZ STER OR WHT (MISCELLANEOUS) ×2
CONT SPEC 4OZ STRL OR WHT (MISCELLANEOUS) ×1
CONTAINER SPEC 2.5X3XGRAD LEK (MISCELLANEOUS) ×3 IMPLANT
DEVICE DUBIN SPECIMEN MAMMOGRA (MISCELLANEOUS) ×1 IMPLANT
DRAIN CHANNEL JP 15F RND 16 (MISCELLANEOUS) ×2 IMPLANT
DRAPE LAPAROTOMY TRNSV 106X77 (MISCELLANEOUS) ×3 IMPLANT
DRSG TELFA 3X8 NADH (GAUZE/BANDAGES/DRESSINGS) IMPLANT
ELECT CAUTERY BLADE TIP 2.5 (TIP)
ELECT REM PT RETURN 9FT ADLT (ELECTROSURGICAL) ×3
ELECTRODE CAUTERY BLDE TIP 2.5 (TIP) ×1 IMPLANT
ELECTRODE REM PT RTRN 9FT ADLT (ELECTROSURGICAL) ×1 IMPLANT
GAUZE FLUFF 18X24 1PLY STRL (GAUZE/BANDAGES/DRESSINGS) ×3 IMPLANT
GLOVE BIO SURGEON STRL SZ7.5 (GLOVE) ×5 IMPLANT
GLOVE INDICATOR 8.0 STRL GRN (GLOVE) ×5 IMPLANT
GOWN STRL REUS W/ TWL LRG LVL3 (GOWN DISPOSABLE) ×2 IMPLANT
GOWN STRL REUS W/TWL LRG LVL3 (GOWN DISPOSABLE) ×6
LABEL OR SOLS (LABEL) ×3 IMPLANT
PACK BASIN MINOR ARMC (MISCELLANEOUS) ×3 IMPLANT
PAD DRESSING TELFA 3X8 NADH (GAUZE/BANDAGES/DRESSINGS) ×1 IMPLANT
PIN SAFETY STRL (MISCELLANEOUS) ×3 IMPLANT
SHEARS FOC LG CVD HARMONIC 17C (MISCELLANEOUS) IMPLANT
SLEVE PROBE SENORX GAMMA FIND (MISCELLANEOUS) ×3 IMPLANT
SPONGE LAP 18X18 5 PK (GAUZE/BANDAGES/DRESSINGS) ×3 IMPLANT
STRIP CLOSURE SKIN 1/2X4 (GAUZE/BANDAGES/DRESSINGS) ×2 IMPLANT
SUT ETHILON 3-0 FS-10 30 BLK (SUTURE) ×3
SUT SILK 0 (SUTURE)
SUT SILK 0 30XBRD TIE 6 (SUTURE) ×1 IMPLANT
SUT SILK 3 0 (SUTURE) ×3
SUT SILK 3-0 18XBRD TIE 12 (SUTURE) ×1 IMPLANT
SUT VIC AB 2-0 CT1 27 (SUTURE) ×6
SUT VIC AB 2-0 CT1 TAPERPNT 27 (SUTURE) ×4 IMPLANT
SUT VIC AB 3-0 SH 27 (SUTURE) ×3
SUT VIC AB 3-0 SH 27X BRD (SUTURE) ×1 IMPLANT
SUT VICRYL+ 3-0 144IN (SUTURE) ×3 IMPLANT
SUTURE EHLN 3-0 FS-10 30 BLK (SUTURE) ×1 IMPLANT
SWABSTK COMLB BENZOIN TINCTURE (MISCELLANEOUS) ×3 IMPLANT
TAPE TRANSPORE STRL 2 31045 (GAUZE/BANDAGES/DRESSINGS) ×3 IMPLANT

## 2016-12-30 NOTE — H&P (Signed)
No change in clinical history or exam.   For left mastectomy and SLN biopsy.

## 2016-12-30 NOTE — Anesthesia Procedure Notes (Signed)
Procedure Name: LMA Insertion Date/Time: 12/30/2016 10:45 AM Performed by: Marsh Dolly Pre-anesthesia Checklist: Patient identified, Patient being monitored, Timeout performed, Emergency Drugs available and Suction available Patient Re-evaluated:Patient Re-evaluated prior to induction Oxygen Delivery Method: Circle system utilized Preoxygenation: Pre-oxygenation with 100% oxygen Induction Type: IV induction Ventilation: Mask ventilation without difficulty LMA: LMA inserted LMA Size: 3.5 Tube type: Oral Number of attempts: 2 Placement Confirmation: positive ETCO2 and breath sounds checked- equal and bilateral Tube secured with: Tape Dental Injury: Teeth and Oropharynx as per pre-operative assessment

## 2016-12-30 NOTE — Transfer of Care (Signed)
Immediate Anesthesia Transfer of Care Note  Patient: Sandra Fritz  Procedure(s) Performed: LEFT MASTECTOMY WITH SENTINEL LYMPH NODE BIOPSY (Left )  Patient Location: PACU  Anesthesia Type:General  Level of Consciousness: awake, alert  and oriented  Airway & Oxygen Therapy: Patient Spontanous Breathing and Patient connected to face mask oxygen  Post-op Assessment: Report given to RN and Post -op Vital signs reviewed and stable  Post vital signs: Reviewed and stable  Last Vitals:  Vitals:   12/30/16 0935  BP: (!) 148/79  Pulse: 94  Resp: 16  Temp: (!) 36.2 C  SpO2: 100%    Last Pain:  Vitals:   12/30/16 0935  TempSrc: Tympanic      Patients Stated Pain Goal: 0 (38/25/05 3976)  Complications: No apparent anesthesia complications

## 2016-12-30 NOTE — Op Note (Signed)
Preoperative diagnosis: Left breast cancer.  Postoperative diagnosis: Same.  Operative procedure: Left simple mastectomy with sentinel node biopsy.  Operating surgeon: Ollen Bowl, M.D.  Anesthesia: Gen. by LMA.  Assessment blood loss: 50 mL.  Clinical note: This 82 year old woman underwent neoadjuvant chemotherapy for a stage II carcinoma the left breast. She presents today for planned mastectomy. She was injected with technetium sulfur colloid prior to the procedure.  Operative note: The patient underwent general anesthesia without difficulty. Anesthesia was administered by LMA. After induction of anesthesia the breast was prepped with alcohol and 5 mL of 0.5% methylene blue was instilled in the subareolar plexus. The breast was then cleansed with ChloraPrep and draped including the axilla and neck. An elliptical incision was made transversely orientated for the mastectomy. Margins of resection were the sternum medially, clavicle superiorly, rectus fascia inferiorly and the serratus muscle laterally. The skin was incised sharply and the remaining dissection completed with the photon blade. 3-0 Vicryl ties were used as indicated. The upper flap was created, approximately 5 mm in thickness. The axillary embolus was opened. The node cecum was identified and 3 small nodes, all with blue tint were identified and sent for frozen section. Touch preps were negative. The lower flap was created and the breast elevated off the underlying pectoralis muscle taking the fascia of that muscle with the specimen. The axillary end of the breast was amputated with 3-0 Vicryl ties for hemostasis. The breast was orientated and sent fresh to pathology per protocol.  The surgical site was irrigated with sterile water. A single 15-gauge Blake drain was brought out through the medial aspect of the inferior flap and I couldn't position with a 3-0 nylon suture. The skin flaps were approximately with a running 2-0 Vicryl  suture in 2 segments. Benzoin, Steri-Strips followed by Telfa and Tegaderm applied. Compressive gauze and wrap applied.  The drain was placed to self suction and the patient taken to the recovery room in stable condition.

## 2016-12-30 NOTE — OR Nursing (Signed)
Patient arrived in SDS somewhat sullen and difficult to get information from her.  She has made no plans for anyone to be with her after surgery.  Will discuss with her family.  Bilateral lower extremeties are 3+ edema with legs swollen, red appearing and warm to touch. Patient states that this is normal.  She does take lasix and potassium but is unsure as to when the last time was that she took these meds, maybe yesterday.

## 2016-12-30 NOTE — Anesthesia Post-op Follow-up Note (Signed)
Anesthesia QCDR form completed.        

## 2016-12-30 NOTE — Anesthesia Preprocedure Evaluation (Signed)
Anesthesia Evaluation  Patient identified by MRN, date of birth, ID band Patient awake    Reviewed: Allergy & Precautions, H&P , NPO status , Patient's Chart, lab work & pertinent test results, reviewed documented beta blocker date and time   History of Anesthesia Complications (+) PROLONGED EMERGENCE and history of anesthetic complications  Airway Mallampati: I  TM Distance: >3 FB Neck ROM: full    Dental  (+) Dental Advidsory Given, Edentulous Upper, Upper Dentures, Poor Dentition   Pulmonary neg shortness of breath, asthma , neg sleep apnea, neg COPD, neg recent URI,           Cardiovascular Exercise Tolerance: Good hypertension, (-) angina(-) CAD, (-) Past MI, (-) Cardiac Stents and (-) CABG (-) dysrhythmias (-) Valvular Problems/Murmurs     Neuro/Psych negative neurological ROS  negative psych ROS   GI/Hepatic Neg liver ROS, hiatal hernia, PUD, GERD  ,  Endo/Other  negative endocrine ROS  Renal/GU negative Renal ROS  negative genitourinary   Musculoskeletal   Abdominal   Peds  Hematology  (+) Blood dyscrasia, anemia ,   Anesthesia Other Findings Past Medical History: No date: Anemia No date: Anxiety No date: Arthritis No date: Blood transfusion without reported diagnosis No date: Breast cancer ([place]) No date: Cataract No date: Complication of anesthesia     Comment:  HARD TO WAKE UP AFTER TONSILLECTOMY No date: GERD (gastroesophageal reflux disease) No date: Hyperlipidemia No date: Hypertension No date: Neuromuscular disorder ([place]) No date: Personal history of chemotherapy 07/28/2016: Primary cancer of upper outer quadrant of left female  breast ([place])     Comment:  2.7 cm, T2, ER/PR +, her 2 neu negative prior to               neoadjuvant chemotherapy   Reproductive/Obstetrics negative OB ROS                             Anesthesia Physical Anesthesia Plan  ASA:  III  Anesthesia Plan: General   Post-op Pain Management:    Induction: Intravenous  PONV Risk Score and Plan: 3 and Ondansetron and Dexamethasone  Airway Management Planned: LMA  Additional Equipment:   Intra-op Plan:   Post-operative Plan: Extubation in OR  Informed Consent: I have reviewed the patients History and Physical, chart, labs and discussed the procedure including the risks, benefits and alternatives for the proposed anesthesia with the patient or authorized representative who has indicated his/her understanding and acceptance.   Dental Advisory Given  Plan Discussed with: Anesthesiologist, CRNA and Surgeon  Anesthesia Plan Comments:         Anesthesia Quick Evaluation

## 2016-12-30 NOTE — Discharge Instructions (Signed)

## 2017-01-02 ENCOUNTER — Encounter: Payer: Self-pay | Admitting: General Surgery

## 2017-01-04 ENCOUNTER — Ambulatory Visit (INDEPENDENT_AMBULATORY_CARE_PROVIDER_SITE_OTHER): Payer: PPO | Admitting: General Surgery

## 2017-01-04 ENCOUNTER — Encounter: Payer: Self-pay | Admitting: General Surgery

## 2017-01-04 VITALS — BP 142/78 | HR 68 | Resp 14 | Ht 69.0 in | Wt 146.0 lb

## 2017-01-04 DIAGNOSIS — C50412 Malignant neoplasm of upper-outer quadrant of left female breast: Secondary | ICD-10-CM

## 2017-01-04 DIAGNOSIS — Z170 Estrogen receptor positive status [ER+]: Secondary | ICD-10-CM

## 2017-01-04 LAB — SURGICAL PATHOLOGY

## 2017-01-04 NOTE — Progress Notes (Signed)
Patient ID: Sandra Fritz, female   DOB: 01/02/1935, 82 y.o.   MRN: [id number]  Chief Complaint  Patient presents with  . Routine Post Op    Left Mastectomy with Sentinel Node Biopsy (12/30/16)    HPI Sandra Fritz is a 82 y.o. female here today for a post-op of left mastectomy with sentinel node biopsy on 12/30/16. She states that she is having some pain in the left axilla incision. Drain sheet is present. HPI  Past Medical History:  Diagnosis Date  . Anemia   . Anxiety   . Arthritis   . Blood transfusion without reported diagnosis   . Breast cancer ([place])   . Cataract   . Complication of anesthesia    HARD TO WAKE UP AFTER TONSILLECTOMY  . GERD (gastroesophageal reflux disease)   . Hyperlipidemia   . Hypertension   . Neuromuscular disorder ([place])   . Personal history of chemotherapy   . Primary cancer of upper outer quadrant of left female breast ([place]) 07/28/2016   2.7 cm, T2, ER/PR +, her 2 neu negative prior to neoadjuvant chemotherapy    Past Surgical History:  Procedure Laterality Date  . APPENDECTOMY  1955   SECONDARY TO RUTURED APPENDIX  . CATARACT EXTRACTION, BILATERAL  2011  . COLONOSCOPY  2002  . DILATION AND CURETTAGE OF UTERUS  1989   Dr. Laurey Morale  . JOINT REPLACEMENT  2012   Right hip  . MASTECTOMY W/ SENTINEL NODE BIOPSY Left 12/30/2016   Procedure: LEFT MASTECTOMY WITH SENTINEL LYMPH NODE BIOPSY;  Surgeon: Robert Bellow, MD;  Location: [facility] ORS;  Service: General;  Laterality: Left;  . MOLE REMOVAL  1957   18 removed by Dr. Hoyle Sauer  . NECK SURGERY  2008   metal in neck  . PORTACATH PLACEMENT Right 08/05/2016   Procedure: INSERTION PORT-A-CATH WITH ULTRASOUND;  Surgeon: Robert Bellow, MD;  Location: [facility] ORS;  Service: General;  Laterality: Right;  . TONSILLECTOMY  1955    Family History  Problem Relation Age of Onset  . Macular degeneration Mother   . Hypertension Mother   . Diabetes Father   . Heart disease Father   . Heart  attack Father 15  . Heart disease Brother 9  . Dermatomyositis Sister   . Cancer Sister        Carcinoma of the uterine call attached to colon and is in remission now  . Breast cancer Maternal Aunt 70  . Breast cancer Paternal Aunt 49  . Breast cancer Cousin        paternal aunt w/ breast ca daughter  . Breast cancer Maternal Aunt 60    Social History Social History  Substance Use Topics  . Smoking status: Never Smoker  . Smokeless tobacco: Never Used  . Alcohol use 0.6 oz/week    1 Glasses of wine per week     Comment: Maybe 3 per year    Allergies  Allergen Reactions  . Erythromycin Rash and Other (See Comments)    ALL MYCINS  . Keflex [Cephalexin] Hives    Patient does not remember  . Statins Other (See Comments)    Muscle and joint pain-patient states she tried all statins.    Current Outpatient Prescriptions  Medication Sig Dispense Refill  . ALPRAZolam (XANAX) 0.25 MG tablet Take 1 tablet (0.25 mg total) by mouth at bedtime as needed for anxiety. 30 tablet 0  . aspirin 81 MG tablet Take 81 mg by mouth daily.     Marland Kitchen  Docusate Calcium (STOOL SOFTENER PO) Take 2 capsules by mouth daily.    Marland Kitchen esomeprazole (NEXIUM) 20 MG capsule Take 1 capsule (20 mg total) by mouth daily. 90 capsule 3  . furosemide (LASIX) 20 MG tablet Take 1 tablet (20 mg total) by mouth daily. 30 tablet 0  . losartan (COZAAR) 100 MG tablet Take 1 tablet (100 mg total) by mouth daily. 90 tablet 3  . Misc Natural Products (OSTEO BI-FLEX JOINT SHIELD PO) Take 2 tablets by mouth daily.    . Multiple Vitamin (MULTIVITAMIN WITH MINERALS) TABS tablet Take 1 tablet by mouth daily.     . potassium chloride SA (K-DUR,KLOR-CON) 20 MEQ tablet Take 1 tablet (20 mEq total) by mouth daily. 30 tablet 0  . prochlorperazine (COMPAZINE) 10 MG tablet Take 1 tablet (10 mg total) by mouth every 6 (six) hours as needed (Nausea or vomiting). 60 tablet 2  . rosuvastatin (CRESTOR) 5 MG tablet Take 1 tablet (5 mg total) by mouth  every other day. 45 tablet 3   No current facility-administered medications for this visit.     Review of Systems Review of Systems  Constitutional: Negative.   Respiratory: Negative.   Cardiovascular: Negative.     Blood pressure (!) 142/78, pulse 68, resp. rate 14, height 5\' 9"  (1.753 m), weight 146 lb (66.2 kg).  Physical Exam Physical Exam  Constitutional: She is oriented to person, place, and time. She appears well-developed and well-nourished.  Pulmonary/Chest:    Good shoulder motion.  Neurological: She is alert and oriented to person, place, and time.  Skin: Skin is warm and dry.   Drain removed. Volumes trending down since surgery with less than 15 mL over last 24 hours.  Data Reviewed DIAGNOSIS:  A. SENTINEL LYMPH NODES #1, #2, #3, LEFT AXILLA; EXCISION;  - NEGATIVE FOR MALIGNANCY, THREE LYMPH NODES (0/3).  - IMMUNOHISTOCHEMISTRY (IHC) FOR CYTOKERATINS IS NEGATIVE.   B. BREAST, LEFT; MASTECTOMY:  - INVASIVE LOBULAR CARCINOMA, 30 MM, WITH DERMAL INVOLVEMENT OF  NIPPLE/AREOLA BY DIRECT EXTENSION, WITHOUT ULCERATION.  - THREE LYMPH NODES FROM LATERAL BREAST NEGATIVE FOR MALIGNANCY (0/3).  - IHC FOR CYTOKERATINS IS NEGATIVE IN THE LYMPH NODES.  - BIOPSY SITE MARKER CLIP IDENTIFIED.   Treatment Effect:    In the breast: No definite response to presurgical therapy in the  invasive carcinoma    In the lymph nodes: No lymph node metastasis and no prominent  fibrous scarring   Pathologic Stage Classification (pTNM, AJCC 8th Edition): ypT2 ypN0   Assessment    Doing well status post left mastectomy.    Plan    A new compressive wrap was applied. This may be removed Saturday for showers. Follow-up examination in one week.       HPI, Physical Exam, Assessment and Plan have been scribed under the direction and in the presence of Hervey Ard, MD.  Sandra Packer, RN  I have completed the exam and reviewed the above documentation for accuracy and  completeness.  I agree with the above.  Haematologist has been used and any errors in dictation or transcription are unintentional.  Hervey Ard, M.D., F.A.C.S.  Robert Bellow 01/05/2017, 7:12 AM

## 2017-01-04 NOTE — Patient Instructions (Signed)
Follow-up next week. May shower beginning on Saturday.

## 2017-01-04 NOTE — Anesthesia Postprocedure Evaluation (Signed)
Anesthesia Post Note  Patient: Janaysha Depaulo Sons  Procedure(s) Performed: LEFT MASTECTOMY WITH SENTINEL LYMPH NODE BIOPSY (Left )  Patient location during evaluation: PACU Anesthesia Type: General Level of consciousness: awake and alert Pain management: pain level controlled Vital Signs Assessment: post-procedure vital signs reviewed and stable Respiratory status: spontaneous breathing, nonlabored ventilation, respiratory function stable and patient connected to nasal cannula oxygen Cardiovascular status: blood pressure returned to baseline and stable Postop Assessment: no apparent nausea or vomiting Anesthetic complications: no     Last Vitals:  Vitals:   12/30/16 1255 12/30/16 1300  BP: (!) 145/76   Pulse:    Resp: 14 15  Temp:  (!) 36.3 C  SpO2: 99%     Last Pain:  Vitals:   01/02/17 0811  TempSrc:   PainSc: 2                  Martha Clan

## 2017-01-05 ENCOUNTER — Telehealth: Payer: Self-pay | Admitting: *Deleted

## 2017-01-05 NOTE — Telephone Encounter (Signed)
-----   Message from Robert Bellow, MD sent at 01/05/2017  7:15 AM EDT ----- Please notify the patient that all the lymph glands were free of any cancer. We'll review on follow-up.

## 2017-01-05 NOTE — Telephone Encounter (Signed)
Notified patient as instructed, patient pleased. Discussed follow-up appointments, patient agrees  

## 2017-01-07 NOTE — Progress Notes (Signed)
[place]  [phone] [phone]  ID: GRACIOUS RENKEN OB: 11-Nov-1934  MR#: [id number]  HYI#:[id number]  Patient Care Team: Jerrol Banana., MD as PCP - General (Unknown Physician Specialty) Jerrol Banana., MD (Family Medicine) Bary Castilla Forest Gleason, MD (General Surgery)  CHIEF COMPLAINT: Clinical stage Ib ER/PR positive, HER-2 negative invasive carcinoma of the upper outer quadrant of the left breast.  INTERVAL HISTORY: Patient returns to clinic today for repeat laboratory work and discussion of her final pathology results. She tolerated her lumpectomy well without significant side effects. The pitting edema on her bilateral lower extremities is unchanged. She continues to have weakness and fatigue, but this is improved. She states her peripheral neuropathy has resolved. She continues to have insomnia. She denies any recent fevers or illnesses. She has no chest pain or shortness of breath. She denies any vomiting, constipation, or diarrhea. She has no urinary complaints. Patient offers no further specific complaints.  REVIEW OF SYSTEMS:   Review of Systems  Constitutional: Positive for malaise/fatigue. Negative for fever and weight loss.  HENT: Negative for sore throat.   Eyes: Negative for blurred vision, double vision and pain.  Respiratory: Negative.  Negative for cough and shortness of breath.   Cardiovascular: Positive for leg swelling. Negative for chest pain.  Gastrointestinal: Negative.  Negative for abdominal pain.  Genitourinary: Negative.   Musculoskeletal: Negative.   Skin: Negative.  Negative for rash.  Neurological: Positive for weakness. Negative for tingling, sensory change and headaches.  Psychiatric/Behavioral: The patient has insomnia. The patient is not nervous/anxious.     As per HPI. Otherwise, a complete review of systems is negative.  PAST MEDICAL HISTORY: Past Medical History:  Diagnosis Date  . Anemia    . Anxiety   . Arthritis   . Blood transfusion without reported diagnosis   . Breast cancer ([place])   . Cataract   . Complication of anesthesia    HARD TO WAKE UP AFTER TONSILLECTOMY  . GERD (gastroesophageal reflux disease)   . Hyperlipidemia   . Hypertension   . Neuromuscular disorder ([place])   . Personal history of chemotherapy   . Primary cancer of upper outer quadrant of left female breast ([place]) 07/28/2016   2.7 cm, T2, ER/PR +, her 2 neu negative prior to neoadjuvant chemotherapy    PAST SURGICAL HISTORY: Past Surgical History:  Procedure Laterality Date  . APPENDECTOMY  1955   SECONDARY TO RUTURED APPENDIX  . CATARACT EXTRACTION, BILATERAL  2011  . COLONOSCOPY  2002  . DILATION AND CURETTAGE OF UTERUS  1989   Dr. Laurey Morale  . JOINT REPLACEMENT  2012   Right hip  . MASTECTOMY W/ SENTINEL NODE BIOPSY Left 12/30/2016   Procedure: LEFT MASTECTOMY WITH SENTINEL LYMPH NODE BIOPSY;  Surgeon: Robert Bellow, MD;  Location: [facility] ORS;  Service: General;  Laterality: Left;  . MOLE REMOVAL  1957   18 removed by Dr. Hoyle Sauer  . NECK SURGERY  2008   metal in neck  . PORTACATH PLACEMENT Right 08/05/2016   Procedure: INSERTION PORT-A-CATH WITH ULTRASOUND;  Surgeon: Robert Bellow, MD;  Location: [facility] ORS;  Service: General;  Laterality: Right;  . TONSILLECTOMY  1955    FAMILY HISTORY: Family History  Problem Relation Age of Onset  . Macular degeneration Mother   . Hypertension Mother   . Diabetes Father   . Heart disease Father   . Heart attack Father 26  . Heart disease Brother 59  . Dermatomyositis  Sister   . Cancer Sister        Carcinoma of the uterine or ovarian all attached to colon and is in remission now  . Breast cancer Maternal Aunt 70  . Breast cancer Paternal Aunt 71  . Breast cancer Cousin        paternal aunt w/ breast ca daughter  . Breast cancer Maternal Aunt 60    ADVANCED DIRECTIVES (Y/N):  N  HEALTH MAINTENANCE: Social History  Substance Use  Topics  . Smoking status: Never Smoker  . Smokeless tobacco: Never Used  . Alcohol use 0.6 oz/week    1 Glasses of wine per week     Comment: Maybe 3 per year     Colonoscopy:  PAP:  Bone density:  Lipid panel:  Allergies  Allergen Reactions  . Erythromycin Rash and Other (See Comments)    ALL MYCINS  . Keflex [Cephalexin] Hives    Patient does not remember  . Statins Other (See Comments)    Muscle and joint pain-patient states she tried all statins.    Current Outpatient Prescriptions  Medication Sig Dispense Refill  . ALPRAZolam (XANAX) 0.25 MG tablet Take 1 tablet (0.25 mg total) by mouth at bedtime as needed for anxiety. 30 tablet 0  . aspirin 81 MG tablet Take 81 mg by mouth daily.     Mariane Baumgarten Calcium (STOOL SOFTENER PO) Take 2 capsules by mouth daily.    Marland Kitchen esomeprazole (NEXIUM) 20 MG capsule Take 1 capsule (20 mg total) by mouth daily. 90 capsule 3  . furosemide (LASIX) 20 MG tablet Take 2 tablets (40 mg total) by mouth 2 (two) times daily. 30 tablet 0  . losartan (COZAAR) 100 MG tablet Take 1 tablet (100 mg total) by mouth daily. 90 tablet 3  . Misc Natural Products (OSTEO BI-FLEX JOINT SHIELD PO) Take 2 tablets by mouth daily.    . Multiple Vitamin (MULTIVITAMIN WITH MINERALS) TABS tablet Take 1 tablet by mouth daily.     . potassium chloride SA (K-DUR,KLOR-CON) 20 MEQ tablet Take 1 tablet (20 mEq total) by mouth daily. 30 tablet 0  . prochlorperazine (COMPAZINE) 10 MG tablet Take 1 tablet (10 mg total) by mouth every 6 (six) hours as needed (Nausea or vomiting). 60 tablet 2  . rosuvastatin (CRESTOR) 5 MG tablet Take 1 tablet (5 mg total) by mouth every other day. 45 tablet 3  . lidocaine-prilocaine (EMLA) cream Apply 1 application topically as needed. Apply to port 1 hour prior to chemotherapy appointment. Cover with plastic wrap. 30 g 2   No current facility-administered medications for this visit.     OBJECTIVE: Vitals:   01/10/17 1058  BP: (!) 143/84    Pulse: 61  Resp: 18  Temp: 98.9 F (37.2 C)  SpO2: 97%     Body mass index is 22.06 kg/m.    ECOG FS:1 - Symptomatic but completely ambulatory  General: Well-developed, well-nourished, no acute distress. Eyes: Pink conjunctiva, anicteric sclera.   Breasts: well-healing scar on left breast. HEENT: Clear oropharynx. Lungs: Clear to auscultation bilaterally. Heart: Regular rate and rhythm. No rubs, murmurs, or gallops. Abdomen: Soft, nontender, nondistended. No organomegaly noted, normoactive bowel sounds. Musculoskeletal: 2-3+ bilateral edema. Neuro: Alert, answering all questions appropriately. Cranial nerves grossly intact. Skin: No rashes or petechiae noted. Psych: Normal affect.   LAB RESULTS:  Lab Results  Component Value Date   NA 140 01/10/2017   K 3.3 (L) 01/10/2017   CL 101 01/10/2017   CO2  27 01/10/2017   GLUCOSE 119 (H) 01/10/2017   BUN 13 01/10/2017   CREATININE 1.10 (H) 01/10/2017   CALCIUM 9.4 01/10/2017   PROT 6.3 (L) 01/10/2017   ALBUMIN 3.4 (L) 01/10/2017   AST 29 01/10/2017   ALT 23 01/10/2017   ALKPHOS 55 01/10/2017   BILITOT 0.5 01/10/2017   GFRNONAA 46 (L) 01/10/2017   GFRAA 54 (L) 01/10/2017    Lab Results  Component Value Date   WBC 4.7 01/10/2017   NEUTROABS 2.5 01/10/2017   HGB 10.5 (L) 01/10/2017   HCT 31.9 (L) 01/10/2017   MCV 93.6 01/10/2017   PLT 276 01/10/2017     STUDIES: Nm Sentinel Node Injection  Result Date: 12/30/2016 CLINICAL DATA:  Left breast cancer. EXAM: NUCLEAR MEDICINE BREAST LYMPHOSCINTIGRAPHY LEFT TECHNIQUE: Intradermal injection of radiopharmaceutical was performed at the 12 o'clock, 3 o'clock, 6 o'clock, and 9 o'clock positions around the left nipple. The patient was then sent to the operating room for sentinel node(s) were identification and removal by the surgeon. RADIOPHARMACEUTICALS:  Total of 1.08 mCi Millipore-filtered Technetium-60msulfur colloid, injected in four aliquots of 0.25 mCi each. IMPRESSION:  Uncomplicated intradermal injection of a total of 1.08 mCi Technetium-949mulfur colloid for purposes of sentinel node identification. Electronically Signed   By: D Lucrezia Europe.D.   On: 12/30/2016 10:01   Mm Diag Breast Tomo Uni Right  Result Date: 12/22/2016 CLINICAL DATA:  Followup 2 groups of right breast indeterminate calcifications, previously recommended for stereotactic guided core needle biopsy. The biopsies were not performed. Undergoing neoadjuvant chemotherapy for left breast cancer. EXAM: 2D DIGITAL DIAGNOSTIC UNILATERAL RIGHT MAMMOGRAM WITH CAD AND ADJUNCT TOMO COMPARISON:  Previous exam(s). ACR Breast Density Category b: There are scattered areas of fibroglandular density. FINDINGS: The 4 mm group of coarse heterogeneous calcifications in the anterior right breast centrally have not changed significantly since 07/15/2016. The 4 mm group of calcifications in the anterior 6 o'clock position of the right breast currently demonstrate dependent layering in the true lateral projection. No findings elsewhere in the right breast suspicious for malignancy. Mammographic images were processed with CAD. IMPRESSION: 1. Stable 4 mm group of coarse, heterogeneous, indeterminate calcifications in the anterior right breast centrally. 2. The 4 mm group of calcifications in the anterior 6 o'clock position of the right breast currently have mammographic features of benign milk of calcium. RECOMMENDATION: Stereotactic guided core needle biopsy of the 4 mm group of indeterminate calcifications in the anterior right breast centrally. I have discussed the findings and recommendations with the patient. Results were also provided in writing at the conclusion of the visit. If applicable, a reminder letter will be sent to the patient regarding the next appointment. BI-RADS CATEGORY  4: Suspicious. Electronically Signed   By: StClaudie Revering.D.   On: 12/22/2016 15:55    ASSESSMENT: Clinical stage Ib ER/PR positive, HER-2  negative invasive carcinoma of the upper outer quadrant of the left breast.  PLAN:    1. 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 She only received 3 cycles of weekly Taxol, her last one on November 08, 2016. Given her worsening performance status and peripheral neuropathy treatment was discontinued altogether. She had a total mastectomy on December 30, 2016 revealing residual disease. She does not require adjuvant XRT. Return to clinic in 2 weeks with repeat laboratory work and further evaluation at which time  will genetically test patient and her 2 sisters given her family history of a sister with ovarian cancer. Will also initiate aromatase inhibitor at that time. 2. Edema: Patient was instructed to increase her Lasix dose to 40 mg twice per day. Return to clinic in 1 week for laboratory work.  Will also send a referral to her primary care physician with the hopes of moving up her appointment sooner than the end of November for further evaluation and management of her edema. 3. Hypokalemia: Continue oral potassium supplementation. She does not require IV potassium at this time. 4. Subdural hematoma: Secondary to fall. Monitor. 5. Anemia: Mild, monitor. 6. Renal insufficiency:  Improved, monitor. 7. Peripheral neuropathy: Resolved. Taxol has been discontinued. Patient canceled her appointment with neurology.   Patient expressed understanding and was in agreement with this plan. She also understands that She can call clinic at any time with any questions, concerns, or complaints.   Cancer Staging Primary cancer of upper outer quadrant of left female breast [facility]) Staging form: Breast, AJCC 8th Edition - Clinical stage from 07/28/2016: Stage IB (cT2, cN0, cM0, G2, ER: Positive, PR: Positive, HER2: Negative) - Signed by Lloyd Huger, MD on 07/28/2016 -  Pathologic stage from 01/10/2017: No Stage Recommended (ypT2, pN0, cM0, G1, ER: Positive, PR: Positive, HER2: Negative) - Signed by Lloyd Huger, MD on 01/10/2017   Lloyd Huger, MD   01/10/2017 2:36 PM

## 2017-01-10 ENCOUNTER — Encounter: Payer: Self-pay | Admitting: General Surgery

## 2017-01-10 ENCOUNTER — Ambulatory Visit (INDEPENDENT_AMBULATORY_CARE_PROVIDER_SITE_OTHER): Payer: PPO | Admitting: General Surgery

## 2017-01-10 ENCOUNTER — Inpatient Hospital Stay: Payer: PPO

## 2017-01-10 ENCOUNTER — Inpatient Hospital Stay (HOSPITAL_BASED_OUTPATIENT_CLINIC_OR_DEPARTMENT_OTHER): Payer: PPO | Admitting: Oncology

## 2017-01-10 VITALS — BP 120/66 | HR 62 | Resp 14 | Ht 69.0 in | Wt 142.0 lb

## 2017-01-10 VITALS — BP 143/84 | HR 61 | Temp 98.9°F | Resp 18 | Wt 149.4 lb

## 2017-01-10 DIAGNOSIS — I10 Essential (primary) hypertension: Secondary | ICD-10-CM

## 2017-01-10 DIAGNOSIS — F419 Anxiety disorder, unspecified: Secondary | ICD-10-CM

## 2017-01-10 DIAGNOSIS — W19XXXS Unspecified fall, sequela: Secondary | ICD-10-CM

## 2017-01-10 DIAGNOSIS — E876 Hypokalemia: Secondary | ICD-10-CM | POA: Diagnosis not present

## 2017-01-10 DIAGNOSIS — C50412 Malignant neoplasm of upper-outer quadrant of left female breast: Secondary | ICD-10-CM | POA: Diagnosis not present

## 2017-01-10 DIAGNOSIS — S065X9S Traumatic subdural hemorrhage with loss of consciousness of unspecified duration, sequela: Secondary | ICD-10-CM

## 2017-01-10 DIAGNOSIS — E785 Hyperlipidemia, unspecified: Secondary | ICD-10-CM

## 2017-01-10 DIAGNOSIS — R5383 Other fatigue: Secondary | ICD-10-CM

## 2017-01-10 DIAGNOSIS — N2889 Other specified disorders of kidney and ureter: Secondary | ICD-10-CM

## 2017-01-10 DIAGNOSIS — Z9221 Personal history of antineoplastic chemotherapy: Secondary | ICD-10-CM

## 2017-01-10 DIAGNOSIS — Z7982 Long term (current) use of aspirin: Secondary | ICD-10-CM

## 2017-01-10 DIAGNOSIS — R600 Localized edema: Secondary | ICD-10-CM

## 2017-01-10 DIAGNOSIS — R531 Weakness: Secondary | ICD-10-CM

## 2017-01-10 DIAGNOSIS — G4700 Insomnia, unspecified: Secondary | ICD-10-CM

## 2017-01-10 DIAGNOSIS — Z79899 Other long term (current) drug therapy: Secondary | ICD-10-CM

## 2017-01-10 DIAGNOSIS — Z803 Family history of malignant neoplasm of breast: Secondary | ICD-10-CM

## 2017-01-10 DIAGNOSIS — D649 Anemia, unspecified: Secondary | ICD-10-CM

## 2017-01-10 DIAGNOSIS — Z170 Estrogen receptor positive status [ER+]: Secondary | ICD-10-CM | POA: Diagnosis not present

## 2017-01-10 DIAGNOSIS — R609 Edema, unspecified: Secondary | ICD-10-CM | POA: Diagnosis not present

## 2017-01-10 DIAGNOSIS — K219 Gastro-esophageal reflux disease without esophagitis: Secondary | ICD-10-CM

## 2017-01-10 LAB — COMPREHENSIVE METABOLIC PANEL
ALBUMIN: 3.4 g/dL — AB (ref 3.5–5.0)
ALT: 23 U/L (ref 14–54)
AST: 29 U/L (ref 15–41)
Alkaline Phosphatase: 55 U/L (ref 38–126)
Anion gap: 12 (ref 5–15)
BILIRUBIN TOTAL: 0.5 mg/dL (ref 0.3–1.2)
BUN: 13 mg/dL (ref 6–20)
CHLORIDE: 101 mmol/L (ref 101–111)
CO2: 27 mmol/L (ref 22–32)
Calcium: 9.4 mg/dL (ref 8.9–10.3)
Creatinine, Ser: 1.1 mg/dL — ABNORMAL HIGH (ref 0.44–1.00)
GFR calc Af Amer: 54 mL/min — ABNORMAL LOW (ref >60)
GFR calc non Af Amer: 46 mL/min — ABNORMAL LOW (ref >60)
GLUCOSE: 119 mg/dL — AB (ref 65–99)
POTASSIUM: 3.3 mmol/L — AB (ref 3.5–5.1)
SODIUM: 140 mmol/L (ref 135–145)
Total Protein: 6.3 g/dL — ABNORMAL LOW (ref 6.5–8.1)

## 2017-01-10 LAB — CBC WITH DIFFERENTIAL/PLATELET
Basophils Absolute: 0 10*3/uL (ref 0–0.1)
Basophils Relative: 1 %
EOS ABS: 0 10*3/uL (ref 0–0.7)
EOS PCT: 1 %
HEMATOCRIT: 31.9 % — AB (ref 35.0–47.0)
HEMOGLOBIN: 10.5 g/dL — AB (ref 12.0–16.0)
LYMPHS ABS: 1.6 10*3/uL (ref 1.0–3.6)
LYMPHS PCT: 34 %
MCH: 31 pg (ref 26.0–34.0)
MCHC: 33.1 g/dL (ref 32.0–36.0)
MCV: 93.6 fL (ref 80.0–100.0)
MONOS PCT: 10 %
Monocytes Absolute: 0.5 10*3/uL (ref 0.2–0.9)
Neutro Abs: 2.5 10*3/uL (ref 1.4–6.5)
Neutrophils Relative %: 54 %
Platelets: 276 10*3/uL (ref 150–440)
RBC: 3.4 MIL/uL — AB (ref 3.80–5.20)
RDW: 15.5 % — AB (ref 11.5–14.5)
WBC: 4.7 10*3/uL (ref 3.6–11.0)

## 2017-01-10 MED ORDER — LIDOCAINE-PRILOCAINE 2.5-2.5 % EX CREA
1.0000 | TOPICAL_CREAM | CUTANEOUS | 2 refills | Status: DC | PRN
Start: 2017-01-10 — End: 2017-10-03

## 2017-01-10 MED ORDER — FUROSEMIDE 20 MG PO TABS: 40.0000 mg | ORAL_TABLET | Freq: Two times a day (BID) | ORAL | 0 refills | Status: DC

## 2017-01-10 MED ORDER — HEPARIN SOD (PORK) LOCK FLUSH 100 UNIT/ML IV SOLN
500.0000 [IU] | Freq: Once | INTRAVENOUS | Status: AC
Start: 2017-01-10 — End: 2017-01-10
  Administered 2017-01-10: 500 [IU] via INTRAVENOUS
  Filled 2017-01-10: qty 5

## 2017-01-10 NOTE — Progress Notes (Signed)
Patient ID: Sandra Fritz, female   DOB: 02/27/1935, 82 y.o.   MRN: [id number]  Chief Complaint  Patient presents with  . Routine Post Op    HPI Sandra Fritz is a 82 y.o. female here today for a post-op of left mastectomy with sentinel node biopsy on 12/30/16. Sister, Juliann Pulse is present at visit.  HPI  Past Medical History:  Diagnosis Date  . Anemia   . Anxiety   . Arthritis   . Blood transfusion without reported diagnosis   . Breast cancer ([place])   . Cataract   . Complication of anesthesia    HARD TO WAKE UP AFTER TONSILLECTOMY  . GERD (gastroesophageal reflux disease)   . Hyperlipidemia   . Hypertension   . Neuromuscular disorder ([place])   . Personal history of chemotherapy   . Primary cancer of upper outer quadrant of left female breast ([place]) 07/28/2016   2.7 cm, T2, ER/PR +, her 2 neu negative prior to neoadjuvant chemotherapy    Past Surgical History:  Procedure Laterality Date  . APPENDECTOMY  1955   SECONDARY TO RUTURED APPENDIX  . CATARACT EXTRACTION, BILATERAL  2011  . COLONOSCOPY  2002  . DILATION AND CURETTAGE OF UTERUS  1989   Dr. Laurey Morale  . JOINT REPLACEMENT  2012   Right hip  . MASTECTOMY W/ SENTINEL NODE BIOPSY Left 12/30/2016   Procedure: LEFT MASTECTOMY WITH SENTINEL LYMPH NODE BIOPSY;  Surgeon: Robert Bellow, MD;  Location: [facility] ORS;  Service: General;  Laterality: Left;  . MOLE REMOVAL  1957   18 removed by Dr. Hoyle Sauer  . NECK SURGERY  2008   metal in neck  . PORTACATH PLACEMENT Right 08/05/2016   Procedure: INSERTION PORT-A-CATH WITH ULTRASOUND;  Surgeon: Robert Bellow, MD;  Location: [facility] ORS;  Service: General;  Laterality: Right;  . TONSILLECTOMY  1955    Family History  Problem Relation Age of Onset  . Macular degeneration Mother   . Hypertension Mother   . Diabetes Father   . Heart disease Father   . Heart attack Father 25  . Heart disease Brother 46  . Dermatomyositis Sister   . Cancer Sister        Carcinoma of the  uterine or ovarian all attached to colon and is in remission now  . Breast cancer Maternal Aunt 70  . Breast cancer Paternal Aunt 50  . Breast cancer Cousin        paternal aunt w/ breast ca daughter  . Breast cancer Maternal Aunt 60    Social History Social History  Substance Use Topics  . Smoking status: Never Smoker  . Smokeless tobacco: Never Used  . Alcohol use 0.6 oz/week    1 Glasses of wine per week     Comment: Maybe 3 per year    Allergies  Allergen Reactions  . Erythromycin Rash and Other (See Comments)    ALL MYCINS  . Keflex [Cephalexin] Hives    Patient does not remember  . Statins Other (See Comments)    Muscle and joint pain-patient states she tried all statins.    Current Outpatient Prescriptions  Medication Sig Dispense Refill  . ALPRAZolam (XANAX) 0.25 MG tablet Take 1 tablet (0.25 mg total) by mouth at bedtime as needed for anxiety. 30 tablet 0  . aspirin 81 MG tablet Take 81 mg by mouth daily.     Mariane Baumgarten Calcium (STOOL SOFTENER PO) Take 2 capsules by mouth daily.    Marland Kitchen esomeprazole (  NEXIUM) 20 MG capsule Take 1 capsule (20 mg total) by mouth daily. 90 capsule 3  . losartan (COZAAR) 100 MG tablet Take 1 tablet (100 mg total) by mouth daily. 90 tablet 3  . Misc Natural Products (OSTEO BI-FLEX JOINT SHIELD PO) Take 2 tablets by mouth daily.    . Multiple Vitamin (MULTIVITAMIN WITH MINERALS) TABS tablet Take 1 tablet by mouth daily.     . potassium chloride SA (K-DUR,KLOR-CON) 20 MEQ tablet Take 1 tablet (20 mEq total) by mouth daily. 30 tablet 0  . prochlorperazine (COMPAZINE) 10 MG tablet Take 1 tablet (10 mg total) by mouth every 6 (six) hours as needed (Nausea or vomiting). 60 tablet 2  . rosuvastatin (CRESTOR) 5 MG tablet Take 1 tablet (5 mg total) by mouth every other day. 45 tablet 3  . furosemide (LASIX) 20 MG tablet Take 2 tablets (40 mg total) by mouth 2 (two) times daily. 30 tablet 0  . lidocaine-prilocaine (EMLA) cream Apply 1 application  topically as needed. Apply to port 1 hour prior to chemotherapy appointment. Cover with plastic wrap. 30 g 2   No current facility-administered medications for this visit.     Review of Systems Review of Systems  Constitutional: Negative.   Respiratory: Negative.   Cardiovascular: Negative.     Blood pressure 120/66, pulse 62, resp. rate 14, height _0  (1.753 m), weight 142 lb (64.4 kg).  Physical Exam Physical Exam  Constitutional: She is oriented to person, place, and time. She appears well-developed and well-nourished.  Pulmonary/Chest:    Good shoulder range of motion.  Neurological: She is alert and oriented to person, place, and time.  Skin: Skin is warm and dry.  Psychiatric: Her behavior is normal.    Data Reviewed DIAGNOSIS:  A. SENTINEL LYMPH NODES #1, #2, #3, LEFT AXILLA; EXCISION;  - NEGATIVE FOR MALIGNANCY, THREE LYMPH NODES (0/3).  - IMMUNOHISTOCHEMISTRY (IHC) FOR CYTOKERATINS IS NEGATIVE.   B. BREAST, LEFT; MASTECTOMY:  - INVASIVE LOBULAR CARCINOMA, 30 MM, WITH DERMAL INVOLVEMENT OF  NIPPLE/AREOLA BY DIRECT EXTENSION, WITHOUT ULCERATION.  - THREE LYMPH NODES FROM LATERAL BREAST NEGATIVE FOR MALIGNANCY (0/3).  - IHC FOR CYTOKERATINS IS NEGATIVE IN THE LYMPH NODES.  - BIOPSY SITE MARKER CLIP IDENTIFIED.  ypT2 ypN0   Assessment    Doing well status post left mastectomy and sentinel node biopsy.    Plan    The patient's sister had some questions about family testing for BRCA. There are some second-degree relatives with breast cancer, but it is unclear if her sister had uterine or ovarian cancer. This would certainly make a difference in the family risk. They've been encouraged to discuss this with Dr. Grayland Ormond at their upcoming visit later today.    Follow up in 2 weeks Appointment with Dr Grayland Ormond as scheduled today  HPI, Physical Exam, Assessment and Plan have been scribed under the direction and in the presence of Robert Bellow, MD. Karie Fetch, RN  I have completed the exam and reviewed the above documentation for accuracy and completeness.  I agree with the above.  Haematologist has been used and any errors in dictation or transcription are unintentional.  Hervey Ard, M.D., F.A.C.S.   Robert Bellow 01/10/2017, 8:42 PM

## 2017-01-10 NOTE — Patient Instructions (Signed)
The patient is aware to call back for any questions or concerns.  

## 2017-01-10 NOTE — Progress Notes (Signed)
Patient is here for follow up, she is doing well no major complaints  

## 2017-01-16 ENCOUNTER — Ambulatory Visit: Payer: PPO | Admitting: General Surgery

## 2017-01-17 ENCOUNTER — Other Ambulatory Visit: Payer: Self-pay | Admitting: *Deleted

## 2017-01-17 ENCOUNTER — Inpatient Hospital Stay: Payer: PPO

## 2017-01-17 ENCOUNTER — Telehealth: Payer: Self-pay | Admitting: *Deleted

## 2017-01-17 VITALS — BP 114/65 | HR 100 | Temp 99.4°F | Resp 20

## 2017-01-17 DIAGNOSIS — Z95828 Presence of other vascular implants and grafts: Secondary | ICD-10-CM

## 2017-01-17 DIAGNOSIS — E876 Hypokalemia: Secondary | ICD-10-CM

## 2017-01-17 DIAGNOSIS — C50412 Malignant neoplasm of upper-outer quadrant of left female breast: Secondary | ICD-10-CM | POA: Diagnosis not present

## 2017-01-17 LAB — BASIC METABOLIC PANEL
ANION GAP: 11 (ref 5–15)
BUN: 16 mg/dL (ref 6–20)
CHLORIDE: 97 mmol/L — AB (ref 101–111)
CO2: 31 mmol/L (ref 22–32)
Calcium: 9.6 mg/dL (ref 8.9–10.3)
Creatinine, Ser: 1.08 mg/dL — ABNORMAL HIGH (ref 0.44–1.00)
GFR calc Af Amer: 55 mL/min — ABNORMAL LOW (ref >60)
GFR calc non Af Amer: 48 mL/min — ABNORMAL LOW (ref >60)
GLUCOSE: 120 mg/dL — AB (ref 65–99)
POTASSIUM: 2.7 mmol/L — AB (ref 3.5–5.1)
Sodium: 139 mmol/L (ref 135–145)

## 2017-01-17 MED ORDER — SODIUM CHLORIDE 0.9 % IV SOLN: 40.0000 meq | Freq: Once | INTRAVENOUS | Status: DC

## 2017-01-17 MED ORDER — POTASSIUM CHLORIDE 2 MEQ/ML IV SOLN
40.0000 meq | Freq: Once | INTRAVENOUS | Status: AC
  Administered 2017-01-17: 40 meq via INTRAVENOUS
  Filled 2017-01-17: qty 20

## 2017-01-17 MED ORDER — POTASSIUM CHLORIDE CRYS ER 20 MEQ PO TBCR: 20.0000 meq | EXTENDED_RELEASE_TABLET | Freq: Two times a day (BID) | ORAL | 0 refills | Status: DC

## 2017-01-17 MED ORDER — HEPARIN SOD (PORK) LOCK FLUSH 100 UNIT/ML IV SOLN
500.0000 [IU] | Freq: Once | INTRAVENOUS | Status: AC
  Administered 2017-01-17: 500 [IU] via INTRAVENOUS

## 2017-01-17 MED ORDER — HEPARIN SOD (PORK) LOCK FLUSH 100 UNIT/ML IV SOLN
INTRAVENOUS | Status: AC
  Filled 2017-01-17: qty 5

## 2017-01-17 NOTE — Telephone Encounter (Signed)
Lab called results, critical potassium of 2.7. Dr. Grayland Ormond made aware, per Dr. Grayland Ormond pt to come in today or tomorrow for IV potassium. Pt notified of results and is coming today at 1:30 for infusion.

## 2017-01-18 ENCOUNTER — Institutional Professional Consult (permissible substitution): Payer: PPO | Admitting: Radiation Oncology

## 2017-01-19 ENCOUNTER — Encounter: Payer: Self-pay | Admitting: General Surgery

## 2017-01-26 ENCOUNTER — Ambulatory Visit: Payer: PPO | Admitting: Family Medicine

## 2017-01-26 VITALS — BP 118/54 | HR 100 | Temp 98.0°F | Resp 14 | Wt 147.6 lb

## 2017-01-26 DIAGNOSIS — E7849 Other hyperlipidemia: Secondary | ICD-10-CM | POA: Diagnosis not present

## 2017-01-26 DIAGNOSIS — R600 Localized edema: Secondary | ICD-10-CM

## 2017-01-26 DIAGNOSIS — K219 Gastro-esophageal reflux disease without esophagitis: Secondary | ICD-10-CM

## 2017-01-26 DIAGNOSIS — R739 Hyperglycemia, unspecified: Secondary | ICD-10-CM | POA: Diagnosis not present

## 2017-01-26 DIAGNOSIS — C50412 Malignant neoplasm of upper-outer quadrant of left female breast: Secondary | ICD-10-CM

## 2017-01-26 LAB — POCT GLYCOSYLATED HEMOGLOBIN (HGB A1C): Hemoglobin A1C: 5.8

## 2017-01-26 MED ORDER — METOLAZONE 2.5 MG PO TABS: 2.5000 mg | ORAL_TABLET | Freq: Every day | ORAL | 5 refills | Status: DC

## 2017-01-26 MED ORDER — FUROSEMIDE 20 MG PO TABS: 20.0000 mg | ORAL_TABLET | Freq: Two times a day (BID) | ORAL | 0 refills | Status: DC

## 2017-01-26 NOTE — Progress Notes (Signed)
Sandra Fritz  MRN: [id number] DOB: 01-10-1935  Subjective:  HPI   Patient is here for follow up. Last office visit was on 10/12/16. Hyperglycemia: patient is not checking her sugar at home. She does have numbness and tingling in her feet at times. Lab Results  Component Value Date   HGBA1C 6.1 10/12/2016   Wt Readings from Last 3 Encounters:  01/26/17 147 lb 9.6 oz (67 kg)  01/10/17 149 lb 6.4 oz (67.8 kg)  01/10/17 142 lb (64.4 kg)   Patient is still having issues with edema and pain in her legs and feet since the chemo treatment she had. She was advised to take lasix 20 mg 1 daily but has increased it back to 2 times daily and this medication helps a little not enough. No chest pain or tightness, no shortness of breath.  Patient is still following Dr Grayland Ormond. She is not on any cancer treatment at this time. Patient Active Problem List   Diagnosis Date Noted  . Breast calcification, right 12/06/2016  . Loss of weight 12/06/2016  . Primary cancer of upper outer quadrant of left female breast ([place]) 07/28/2016  . Left breast mass 07/25/2016  . Abnormal finding on mammography 07/25/2016  . Hyperglycemia 04/12/2016  . Absolute anemia 07/25/2014  . Anxiety 07/25/2014  . AB (asthmatic bronchitis) 07/25/2014  . 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 Intervertebral cervical disc disorder with myelopathy, cervical region 07/25/2014  . Chest pain 07/25/2014  . Breath shortness 07/25/2014  . Edema leg 07/25/2014  . Fatigue 07/25/2014  . Acid reflux 07/25/2014  . 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 Arthralgia of hip 07/25/2014  . HLD (hyperlipidemia) 07/25/2014  . Below normal amount of sodium in the blood 07/25/2014  . Allergic state 07/25/2014  . Neuropathy 07/25/2014  . Arthritis, degenerative 07/25/2014  . Peptic ulcer 07/25/2014  . FOOT, PAIN 10/12/2007  . Hyperlipidemia 09/18/2007  . Hypertension 09/18/2007  . PEPTIC ULCER DISEASE 09/18/2007  . PLANTAR  FASCIITIS, LEFT 09/18/2007    Past Medical History:  Diagnosis Date  . Anemia   . Anxiety   . Arthritis   . Blood transfusion without reported diagnosis   . Breast cancer ([place])   . Cataract   . Complication of anesthesia    HARD TO WAKE UP AFTER TONSILLECTOMY  . GERD (gastroesophageal reflux disease)   . Hyperlipidemia   . Hypertension   . Neuromuscular disorder ([place])   . Personal history of chemotherapy   . Primary cancer of upper outer quadrant of left female breast ([place]) 07/28/2016   2.7 cm, T2, ER/PR +, her 2 neu negative prior to neoadjuvant chemotherapy    Social History   Socioeconomic History  . Marital status: Widowed    Spouse name: Not on file  . Number of children: Not on file  . Years of education: Not on file  . Highest education level: Not on file  Social Needs  . Financial resource strain: Not on file  . Food insecurity - worry: Not on file  . Food insecurity - inability: Not on file  . Transportation needs - medical: Not on file  . Transportation needs - non-medical: Not on file  Occupational History  . Not on file  Tobacco Use  . Smoking status: Never Smoker  . Smokeless tobacco: Never Used  Substance and Sexual Activity  . Alcohol use: Yes    Alcohol/week: 0.6 oz    Types: 1 Glasses of wine per week    Comment: Maybe  3 per year  . Drug use: No  . Sexual activity: No  Other Topics Concern  . Not on file  Social History Narrative  . Not on file    Outpatient Encounter Medications as of 01/26/2017  Medication Sig  . ALPRAZolam (XANAX) 0.25 MG tablet Take 1 tablet (0.25 mg total) by mouth at bedtime as needed for anxiety.  Marland Kitchen aspirin 81 MG tablet Take 81 mg by mouth daily.   Mariane Baumgarten Calcium (STOOL SOFTENER PO) Take 2 capsules by mouth daily.  Marland Kitchen esomeprazole (NEXIUM) 20 MG capsule Take 1 capsule (20 mg total) by mouth daily.  . furosemide (LASIX) 20 MG tablet Take 2 tablets (40 mg total) by mouth 2 (two) times daily.  Marland Kitchen lidocaine-prilocaine  (EMLA) cream Apply 1 application topically as needed. Apply to port 1 hour prior to chemotherapy appointment. Cover with plastic wrap.  . losartan (COZAAR) 100 MG tablet Take 1 tablet (100 mg total) by mouth daily.  . Misc Natural Products (OSTEO BI-FLEX JOINT SHIELD PO) Take 2 tablets by mouth daily.  . Multiple Vitamin (MULTIVITAMIN WITH MINERALS) TABS tablet Take 1 tablet by mouth daily.   . potassium chloride SA (K-DUR,KLOR-CON) 20 MEQ tablet Take 1 tablet (20 mEq total) by mouth 2 (two) times daily.  . prochlorperazine (COMPAZINE) 10 MG tablet Take 1 tablet (10 mg total) by mouth every 6 (six) hours as needed (Nausea or vomiting).  . rosuvastatin (CRESTOR) 5 MG tablet Take 1 tablet (5 mg total) by mouth every other day.   No facility-administered encounter medications on file as of 01/26/2017.     Allergies  Allergen Reactions  . Erythromycin Rash and Other (See Comments)    ALL MYCINS  . Keflex [Cephalexin] Hives    Patient does not remember  . Statins Other (See Comments)    Muscle and joint pain-patient states she tried all statins.    Review of Systems  Constitutional: Positive for malaise/fatigue.  HENT: Negative.   Eyes: Negative.   Respiratory: Negative.   Cardiovascular: Positive for leg swelling. Negative for chest pain and palpitations.  Gastrointestinal: Negative.   Musculoskeletal: Positive for joint pain.  Neurological: Positive for tingling and weakness. Negative for dizziness and headaches.  Endo/Heme/Allergies: Negative.   Psychiatric/Behavioral: Negative.     Objective:  BP (!) 118/54   Pulse 100   Temp 98 F (36.7 C)   Resp 14   Wt 147 lb 9.6 oz (67 kg)   BMI 21.80 kg/m   Physical Exam  Constitutional: She is oriented to person, place, and time and well-developed, well-nourished, and in no distress.  HENT:  Head: Normocephalic and atraumatic.  Cardiovascular: Normal rate, regular rhythm, normal heart sounds and intact distal pulses. Exam reveals no  gallop.  No murmur heard. Pulmonary/Chest: Effort normal and breath sounds normal. No respiratory distress. She has no wheezes.  Abdominal: Soft.  Musculoskeletal: She exhibits edema.  1+ edema.  Neurological: She is alert and oriented to person, place, and time.  Skin: Skin is warm and dry.  Psychiatric: Mood, memory, affect and judgment normal.    Assessment and Plan :  1. Hyperglycemia 5.8 better. Stable. - POCT HgB A1C  2. Primary cancer of upper outer quadrant of left female breast [facility]) Patient is following up with Dr Grayland Ormond.  3. Gastroesophageal reflux disease, esophagitis presence not specified 4. Other hyperlipidemia  5. Edema leg Add Metolazone and continue Lasix twice daily. Check labs and re check edema in 2 weeks. I do not think  this is CHF. - metolazone (ZAROXOLYN) 2.5 MG tablet; Take 1 tablet (2.5 mg total) daily by mouth.  Dispense: 30 tablet; Refill: 5 - furosemide (LASIX) 20 MG tablet; Take 1 tablet (20 mg total) 2 (two) times daily by mouth.  Dispense: 30 tablet; Refill: 0  HPI, Exam and A&P transcribed by Tiffany Kocher, RMA under direction and in the presence of Miguel Aschoff, MD. I have done the exam and reviewed the chart and it is accurate to the best of my knowledge. Development worker, community has been used and  any errors in dictation or transcription are unintentional. Miguel Aschoff M.D. [facility]

## 2017-01-30 ENCOUNTER — Encounter: Payer: Self-pay | Admitting: General Surgery

## 2017-01-30 ENCOUNTER — Ambulatory Visit (INDEPENDENT_AMBULATORY_CARE_PROVIDER_SITE_OTHER): Payer: PPO | Admitting: General Surgery

## 2017-01-30 VITALS — BP 124/62 | HR 70 | Resp 14 | Ht 69.0 in | Wt 142.0 lb

## 2017-01-30 DIAGNOSIS — Z170 Estrogen receptor positive status [ER+]: Secondary | ICD-10-CM

## 2017-01-30 DIAGNOSIS — C50412 Malignant neoplasm of upper-outer quadrant of left female breast: Secondary | ICD-10-CM

## 2017-01-30 NOTE — Progress Notes (Signed)
Patient ID: Sandra Fritz, female   DOB: Jul 13, 1934, 82 y.o.   MRN: [id number]  Chief Complaint  Patient presents with  . Routine Post Op    HPI Sandra Fritz is a 82 y.o. female here today for her  follow up left mastectomy. Patient states she is doing well. Sister, Juliann Pulse is present at visit.  HPI  Past Medical History:  Diagnosis Date  . Anemia   . Anxiety   . Arthritis   . Blood transfusion without reported diagnosis   . Breast cancer ([place])   . Cataract   . Complication of anesthesia    HARD TO WAKE UP AFTER TONSILLECTOMY  . GERD (gastroesophageal reflux disease)   . Hyperlipidemia   . Hypertension   . Neuromuscular disorder ([place])   . Personal history of chemotherapy   . Primary cancer of upper outer quadrant of left female breast ([place]) 07/28/2016   2.7 cm, T2, ER/PR +, her 2 neu negative prior to neoadjuvant chemotherapy    Past Surgical History:  Procedure Laterality Date  . APPENDECTOMY  1955   SECONDARY TO RUTURED APPENDIX  . CATARACT EXTRACTION, BILATERAL  2011  . COLONOSCOPY  2002  . DILATION AND CURETTAGE OF UTERUS  1989   Dr. Laurey Morale  . JOINT REPLACEMENT  2012   Right hip  . MOLE REMOVAL  1957   18 removed by Dr. Hoyle Sauer  . NECK SURGERY  2008   metal in neck  . TONSILLECTOMY  1955    Family History  Problem Relation Age of Onset  . Macular degeneration Mother   . Hypertension Mother   . Diabetes Father   . Heart disease Father   . Heart attack Father 75  . Heart disease Brother 69  . Dermatomyositis Sister   . Cancer Sister        Carcinoma of the uterine or ovarian all attached to colon and is in remission now  . Breast cancer Maternal Aunt 70  . Breast cancer Paternal Aunt 34  . Breast cancer Cousin        paternal aunt w/ breast ca daughter  . Breast cancer Maternal Aunt 60    Social History Social History   Tobacco Use  . Smoking status: Never Smoker  . Smokeless tobacco: Never Used  Substance Use Topics  . Alcohol use:  Yes    Alcohol/week: 0.6 oz    Types: 1 Glasses of wine per week    Comment: Maybe 3 per year  . Drug use: No    Allergies  Allergen Reactions  . Erythromycin Rash and Other (See Comments)    ALL MYCINS  . Keflex [Cephalexin] Hives    Patient does not remember  . Statins Other (See Comments)    Muscle and joint pain-patient states she tried all statins.    Current Outpatient Medications  Medication Sig Dispense Refill  . ALPRAZolam (XANAX) 0.25 MG tablet Take 1 tablet (0.25 mg total) by mouth at bedtime as needed for anxiety. 30 tablet 0  . aspirin 81 MG tablet Take 81 mg by mouth daily.     Mariane Baumgarten Calcium (STOOL SOFTENER PO) Take 2 capsules by mouth daily.    Marland Kitchen esomeprazole (NEXIUM) 20 MG capsule Take 1 capsule (20 mg total) by mouth daily. 90 capsule 3  . furosemide (LASIX) 20 MG tablet Take 1 tablet (20 mg total) 2 (two) times daily by mouth. 30 tablet 0  . lidocaine-prilocaine (EMLA) cream Apply 1 application topically as needed.  Apply to port 1 hour prior to chemotherapy appointment. Cover with plastic wrap. 30 g 2  . losartan (COZAAR) 100 MG tablet Take 1 tablet (100 mg total) by mouth daily. 90 tablet 3  . metolazone (ZAROXOLYN) 2.5 MG tablet Take 1 tablet (2.5 mg total) daily by mouth. 30 tablet 5  . Misc Natural Products (OSTEO BI-FLEX JOINT SHIELD PO) Take 2 tablets by mouth daily.    . Multiple Vitamin (MULTIVITAMIN WITH MINERALS) TABS tablet Take 1 tablet by mouth daily.     . potassium chloride SA (K-DUR,KLOR-CON) 20 MEQ tablet Take 1 tablet (20 mEq total) by mouth 2 (two) times daily. 60 tablet 0  . prochlorperazine (COMPAZINE) 10 MG tablet Take 1 tablet (10 mg total) by mouth every 6 (six) hours as needed (Nausea or vomiting). 60 tablet 2  . rosuvastatin (CRESTOR) 5 MG tablet Take 1 tablet (5 mg total) by mouth every other day. 45 tablet 3   No current facility-administered medications for this visit.     Review of Systems Review of Systems  Blood pressure  124/62, pulse 70, resp. rate 14, height 5\' 9"  (1.753 m), weight 142 lb (64.4 kg).  Physical Exam Physical Exam  Constitutional: She is oriented to person, place, and time. She appears well-developed and well-nourished.  Pulmonary/Chest:    Left mastectomy site is clean and healing well.   Neurological: She is alert and oriented to person, place, and time.  Skin: Skin is warm and dry.    Data Reviewed Medical oncology notes of 01/10/2017 reviewed.  Assessment    Doing well status post left mastectomy.    Plan    Prescription for breast prosthesis if she desires provided.    Patient to return in two months. The patient is aware to call back for any questions or concerns.  HPI, Physical Exam, Assessment and Plan have been scribed under the direction and in the presence of Hervey Ard, MD.  Gaspar Cola, CMA  I have completed the exam and reviewed the above documentation for accuracy and completeness.  I agree with the above.  Haematologist has been used and any errors in dictation or transcription are unintentional.  Hervey Ard, M.D., F.A.C.S.  Robert Bellow 01/30/2017, 8:16 PM

## 2017-01-30 NOTE — Patient Instructions (Addendum)
Patient to return in two months.  

## 2017-02-01 ENCOUNTER — Telehealth: Payer: Self-pay | Admitting: Family Medicine

## 2017-02-01 NOTE — Telephone Encounter (Signed)
I do not see any messages in the chart for the patient, it looks like this was a reminder call about her upcoming appointment, patient Sandra Fritz, RMA

## 2017-02-01 NOTE — Telephone Encounter (Signed)
Pt is returning call.  CB#[phone]/MW

## 2017-02-06 ENCOUNTER — Ambulatory Visit: Payer: PPO | Admitting: Family Medicine

## 2017-02-06 VITALS — BP 100/64 | HR 98 | Resp 16 | Wt 144.0 lb

## 2017-02-06 DIAGNOSIS — R600 Localized edema: Secondary | ICD-10-CM | POA: Diagnosis not present

## 2017-02-06 DIAGNOSIS — C50412 Malignant neoplasm of upper-outer quadrant of left female breast: Secondary | ICD-10-CM | POA: Diagnosis not present

## 2017-02-06 DIAGNOSIS — G629 Polyneuropathy, unspecified: Secondary | ICD-10-CM

## 2017-02-06 MED ORDER — GABAPENTIN 100 MG PO CAPS: 100.0000 mg | ORAL_CAPSULE | Freq: Every day | ORAL | 3 refills | Status: DC

## 2017-02-06 NOTE — Progress Notes (Signed)
Sandra Fritz  MRN: [id number] DOB: [birth year]  Subjective:  HPI  The patient is a 82 year old female who presents for follow up of edema.  She was last seen on 01/26/17 for edema in her lower extremity.  She was started on Metolazone , instructed to continue Lasix twice daily and follow up today.  The patient reports not seeing any improvement and it is of note that her weight is up 2 more pounds.  However she does note that where the swelling went up to her knees before, now it is just up to her ankles.   The patient also complains of being dizzy today.  When questioned further she said it is not like the room spinning and she is not nauseated.   She said she feels like she is being pulled forward.  The patient said the dizziness caused her to stumble forward into the dining table this morning.  She denies actually falling and was not injured.   Patient Active Problem List   Diagnosis Date Noted  . Loss of weight 12/06/2016  . Bilateral carotid artery stenosis 10/18/2016  . Primary cancer of upper outer quadrant of left female breast ([place]) 07/28/2016  . Left breast mass 07/25/2016  . Abnormal finding on mammography 07/25/2016  . Hyperglycemia 04/12/2016  . Absolute anemia 07/25/2014  . Anxiety 07/25/2014  . AB (asthmatic bronchitis) 07/25/2014  . 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 Intervertebral cervical disc disorder with myelopathy, cervical region 07/25/2014  . Chest pain 07/25/2014  . Breath shortness 07/25/2014  . Edema leg 07/25/2014  . Fatigue 07/25/2014  . Acid reflux 07/25/2014  . 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 Arthralgia of hip 07/25/2014  . HLD (hyperlipidemia) 07/25/2014  . Below normal amount of sodium in the blood 07/25/2014  . Allergic state 07/25/2014  . Neuropathy 07/25/2014  . Arthritis, degenerative 07/25/2014  . Peptic ulcer 07/25/2014  . FOOT, PAIN 10/12/2007  . Hyperlipidemia 09/18/2007  . Hypertension 09/18/2007  . PEPTIC ULCER  DISEASE 09/18/2007  . PLANTAR FASCIITIS, LEFT 09/18/2007    Past Medical History:  Diagnosis Date  . Anemia   . Anxiety   . Arthritis   . Blood transfusion without reported diagnosis   . Breast cancer ([place])   . Cataract   . Complication of anesthesia    HARD TO WAKE UP AFTER TONSILLECTOMY  . GERD (gastroesophageal reflux disease)   . Hyperlipidemia   . Hypertension   . Neuromuscular disorder ([place])   . Personal history of chemotherapy   . Primary cancer of upper outer quadrant of left female breast ([place]) 07/28/2016   2.7 cm, T2, ER/PR +, her 2 neu negative prior to neoadjuvant chemotherapy    Social History   Socioeconomic History  . Marital status: Widowed    Spouse name: Not on file  . Number of children: Not on file  . Years of education: Not on file  . Highest education level: Not on file  Social Needs  . Financial resource strain: Not on file  . Food insecurity - worry: Not on file  . Food insecurity - inability: Not on file  . Transportation needs - medical: Not on file  . Transportation needs - non-medical: Not on file  Occupational History  . Not on file  Tobacco Use  . Smoking status: Never Smoker  . Smokeless tobacco: Never Used  Substance and Sexual Activity  . Alcohol use: Yes    Alcohol/week: 0.6 oz    Types: 1 Glasses  of wine per week    Comment: Maybe 3 per year  . Drug use: No  . Sexual activity: No  Other Topics Concern  . Not on file  Social History Narrative  . Not on file    Outpatient Encounter Medications as of 02/06/2017  Medication Sig  . ALPRAZolam (XANAX) 0.25 MG tablet Take 1 tablet (0.25 mg total) by mouth at bedtime as needed for anxiety.  Marland Kitchen aspirin 81 MG tablet Take 81 mg by mouth daily.   Mariane Baumgarten Calcium (STOOL SOFTENER PO) Take 2 capsules by mouth daily.  Marland Kitchen esomeprazole (NEXIUM) 20 MG capsule Take 1 capsule (20 mg total) by mouth daily.  . furosemide (LASIX) 20 MG tablet Take 1 tablet (20 mg total) 2 (two) times daily by  mouth.  . lidocaine-prilocaine (EMLA) cream Apply 1 application topically as needed. Apply to port 1 hour prior to chemotherapy appointment. Cover with plastic wrap.  . losartan (COZAAR) 100 MG tablet Take 1 tablet (100 mg total) by mouth daily.  . metolazone (ZAROXOLYN) 2.5 MG tablet Take 1 tablet (2.5 mg total) daily by mouth.  . Misc Natural Products (OSTEO BI-FLEX JOINT SHIELD PO) Take 2 tablets by mouth daily.  . Multiple Vitamin (MULTIVITAMIN WITH MINERALS) TABS tablet Take 1 tablet by mouth daily.   . potassium chloride SA (K-DUR,KLOR-CON) 20 MEQ tablet Take 1 tablet (20 mEq total) by mouth 2 (two) times daily.  . prochlorperazine (COMPAZINE) 10 MG tablet Take 1 tablet (10 mg total) by mouth every 6 (six) hours as needed (Nausea or vomiting).  . rosuvastatin (CRESTOR) 5 MG tablet Take 1 tablet (5 mg total) by mouth every other day.   No facility-administered encounter medications on file as of 02/06/2017.     Allergies  Allergen Reactions  . Erythromycin Rash and Other (See Comments)    ALL MYCINS  . Keflex [Cephalexin] Hives    Patient does not remember  . Statins Other (See Comments)    Muscle and joint pain-patient states she tried all statins.    Review of Systems  Constitutional: Positive for malaise/fatigue. Negative for fever.  Respiratory: Negative for cough, shortness of breath and wheezing.   Cardiovascular: Positive for leg swelling. Negative for chest pain, palpitations and orthopnea.  Neurological: Positive for dizziness and weakness. Negative for headaches.    Objective:  BP 100/64 (BP Location: Right Arm, Patient Position: Sitting, Cuff Size: Normal)   Pulse 98   Resp 16   Wt 144 lb (65.3 kg)   SpO2 99%   BMI 21.27 kg/m   Physical Exam  Constitutional:  Cachectic   HENT:  Head: Normocephalic and atraumatic.  Eyes: Conjunctivae are normal. Pupils are equal, round, and reactive to light.  Mild nystagmus to the right  Neck: Normal range of motion.    Cardiovascular: Normal rate, regular rhythm and normal heart sounds.  Pulmonary/Chest: Effort normal and breath sounds normal.  Neurological:  No cogwheeling    Assessment and Plan :  1. Neuropathy  - gabapentin (NEURONTIN) 100 MG capsule; Take 1 capsule (100 mg total) at bedtime by mouth. Take at suppertime  Dispense: 90 capsule; Refill: 3  2. Edema leg  3.Breast Cancer  I have done the exam and reviewed the chart and it is accurate to the best of my knowledge. Development worker, community has been used and  any errors in dictation or transcription are unintentional. Miguel Aschoff M.D. [facility]

## 2017-02-06 NOTE — Patient Instructions (Signed)
Stop Metolazone  Take Gabapentin with supper

## 2017-02-12 NOTE — Progress Notes (Signed)
[place]  [phone] [phone]  ID: Sandra Fritz OB: 11/12/1934  MR#: [id number]  AOZ#:[id number]  Patient Care Team: Jerrol Banana., MD as PCP - General (Unknown Physician Specialty) Jerrol Banana., MD (Family Medicine) Bary Castilla Forest Gleason, MD (General Surgery)  CHIEF COMPLAINT: Clinical stage Ib ER/PR positive, HER-2 negative invasive carcinoma of the upper outer quadrant of the left breast.  INTERVAL HISTORY: Patient returns to clinic today for repeat laboratory work, genetic testing, and initiation of aromatase inhibitor.  She continues to have weakness and fatigue, but states this is improving.  The edema in her bilateral lower extremities is unchanged. She continues to have peripheral neuropathy and is being evaluated by neurology.  She continues to have insomnia. She denies any recent fevers or illnesses. She has no chest pain or shortness of breath. She denies any vomiting, constipation, or diarrhea. She has no urinary complaints. Patient offers no further specific complaints.  REVIEW OF SYSTEMS:   Review of Systems  Constitutional: Positive for malaise/fatigue. Negative for fever and weight loss.  HENT: Negative for sore throat.   Eyes: Negative for blurred vision, double vision and pain.  Respiratory: Negative.  Negative for cough and shortness of breath.   Cardiovascular: Positive for leg swelling. Negative for chest pain.  Gastrointestinal: Negative.  Negative for abdominal pain.  Genitourinary: Negative.   Musculoskeletal: Negative.   Skin: Negative.  Negative for rash.  Neurological: Positive for tingling, sensory change and weakness. Negative for headaches.  Psychiatric/Behavioral: The patient has insomnia. The patient is not nervous/anxious.     As per HPI. Otherwise, a complete review of systems is negative.  PAST MEDICAL HISTORY: Past Medical History:  Diagnosis Date  . Anemia   . Anxiety   .  Arthritis   . Blood transfusion without reported diagnosis   . Breast cancer ([place])   . Cataract   . Complication of anesthesia    HARD TO WAKE UP AFTER TONSILLECTOMY  . GERD (gastroesophageal reflux disease)   . Hyperlipidemia   . Hypertension   . Neuromuscular disorder ([place])   . Personal history of chemotherapy   . Primary cancer of upper outer quadrant of left female breast ([place]) 07/28/2016   2.7 cm, T2, ER/PR +, her 2 neu negative prior to neoadjuvant chemotherapy    PAST SURGICAL HISTORY: Past Surgical History:  Procedure Laterality Date  . APPENDECTOMY  1955   SECONDARY TO RUTURED APPENDIX  . CATARACT EXTRACTION, BILATERAL  2011  . COLONOSCOPY  2002  . DILATION AND CURETTAGE OF UTERUS  1989   Dr. Laurey Morale  . JOINT REPLACEMENT  2012   Right hip  . MASTECTOMY W/ SENTINEL NODE BIOPSY Left 12/30/2016   Procedure: LEFT MASTECTOMY WITH SENTINEL LYMPH NODE BIOPSY;  Surgeon: Robert Bellow, MD;  Location: [facility] ORS;  Service: General;  Laterality: Left;  . MOLE REMOVAL  1957   18 removed by Dr. Hoyle Sauer  . NECK SURGERY  2008   metal in neck  . PORTACATH PLACEMENT Right 08/05/2016   Procedure: INSERTION PORT-A-CATH WITH ULTRASOUND;  Surgeon: Robert Bellow, MD;  Location: [facility] ORS;  Service: General;  Laterality: Right;  . TONSILLECTOMY  1955    FAMILY HISTORY: Family History  Problem Relation Age of Onset  . Macular degeneration Mother   . Hypertension Mother   . Diabetes Father   . Heart disease Father   . Heart attack Father 33  . Heart disease Brother 39  . Dermatomyositis Sister   .  Cancer Sister        Carcinoma of the uterine or ovarian all attached to colon and is in remission now  . Breast cancer Maternal Aunt 70  . Breast cancer Paternal Aunt 82  . Breast cancer Cousin        paternal aunt w/ breast ca daughter  . Breast cancer Maternal Aunt 60    ADVANCED DIRECTIVES (Y/N):  N  HEALTH MAINTENANCE: Social History   Tobacco Use  . Smoking  status: Never Smoker  . Smokeless tobacco: Never Used  Substance Use Topics  . Alcohol use: Yes    Alcohol/week: 0.6 oz    Types: 1 Glasses of wine per week    Comment: Maybe 3 per year  . Drug use: No     Colonoscopy:  PAP:  Bone density:  Lipid panel:  Allergies  Allergen Reactions  . Erythromycin Rash and Other (See Comments)    ALL MYCINS  . Keflex [Cephalexin] Hives    Patient does not remember  . Statins Other (See Comments)    Muscle and joint pain-patient states she tried all statins.    Current Outpatient Medications  Medication Sig Dispense Refill  . ALPRAZolam (XANAX) 0.25 MG tablet Take 1 tablet (0.25 mg total) by mouth at bedtime as needed for anxiety. 30 tablet 0  . aspirin 81 MG tablet Take 81 mg by mouth daily.     Mariane Baumgarten Calcium (STOOL SOFTENER PO) Take 2 capsules by mouth daily.    Marland Kitchen esomeprazole (NEXIUM) 20 MG capsule Take 1 capsule (20 mg total) by mouth daily. 90 capsule 3  . furosemide (LASIX) 20 MG tablet Take 1 tablet (20 mg total) 2 (two) times daily by mouth. 30 tablet 0  . gabapentin (NEURONTIN) 100 MG capsule Take 1 capsule (100 mg total) at bedtime by mouth. Take at suppertime 90 capsule 3  . lidocaine-prilocaine (EMLA) cream Apply 1 application topically as needed. Apply to port 1 hour prior to chemotherapy appointment. Cover with plastic wrap. 30 g 2  . losartan (COZAAR) 100 MG tablet Take 1 tablet (100 mg total) by mouth daily. 90 tablet 3  . Misc Natural Products (OSTEO BI-FLEX JOINT SHIELD PO) Take 2 tablets by mouth daily.    . Multiple Vitamin (MULTIVITAMIN WITH MINERALS) TABS tablet Take 1 tablet by mouth daily.     . potassium chloride SA (K-DUR,KLOR-CON) 20 MEQ tablet Take 1 tablet (20 mEq total) by mouth 2 (two) times daily. 60 tablet 0  . prochlorperazine (COMPAZINE) 10 MG tablet Take 1 tablet (10 mg total) by mouth every 6 (six) hours as needed (Nausea or vomiting). 60 tablet 2  . rosuvastatin (CRESTOR) 5 MG tablet Take 1 tablet (5  mg total) by mouth every other day. 45 tablet 3  . letrozole (FEMARA) 2.5 MG tablet Take 1 tablet (2.5 mg total) by mouth daily. 30 tablet 3   No current facility-administered medications for this visit.     OBJECTIVE: Vitals:   02/14/17 1507  BP: 111/65  Pulse: (!) 106  Resp: 18  Temp: (!) 96.7 F (35.9 C)     Body mass index is 20.98 kg/m.    ECOG FS:1 - Symptomatic but completely ambulatory  General: Well-developed, well-nourished, no acute distress. Eyes: Pink conjunctiva, anicteric sclera.   Breasts: Well-healing scar on left breast. HEENT: Clear oropharynx. Lungs: Clear to auscultation bilaterally. Heart: Regular rate and rhythm. No rubs, murmurs, or gallops. Abdomen: Soft, nontender, nondistended. No organomegaly noted, normoactive bowel sounds. Musculoskeletal:  2+ bilateral edema. Neuro: Alert, answering all questions appropriately. Cranial nerves grossly intact. Skin: No rashes or petechiae noted. Psych: Normal affect.   LAB RESULTS:  Lab Results  Component Value Date   NA 138 02/14/2017   K 3.6 02/14/2017   CL 97 (L) 02/14/2017   CO2 29 02/14/2017   GLUCOSE 97 02/14/2017   BUN 21 (H) 02/14/2017   CREATININE 1.37 (H) 02/14/2017   CALCIUM 10.0 02/14/2017   PROT 7.0 02/14/2017   ALBUMIN 3.7 02/14/2017   AST 34 02/14/2017   ALT 19 02/14/2017   ALKPHOS 53 02/14/2017   BILITOT 0.4 02/14/2017   GFRNONAA 36 (L) 02/14/2017   GFRAA 41 (L) 02/14/2017    Lab Results  Component Value Date   WBC 5.9 02/14/2017   NEUTROABS 3.9 02/14/2017   HGB 11.1 (L) 02/14/2017   HCT 33.3 (L) 02/14/2017   MCV 89.7 02/14/2017   PLT 293 02/14/2017     STUDIES: No results found.  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  PLAN:    1. Clinical stage Ib ER/PR positive, HER-2 negative invasive carcinoma of the upper outer quadrant of the left breast: Patient completed 4 cycles of  Adriamycin and Cytoxan on October 14, 2016. She only received 3 cycles of weekly Taxol, her last one on November 08, 2016. Given her worsening performance status and peripheral neuropathy treatment was discontinued altogether. She had a total mastectomy on December 30, 2016 revealing residual disease. She does not require adjuvant XRT.  Patient was given a prescription for letrozole which she will be required to take for a total of 5 years completing in November 2023.  We will get baseline bone mineral density in the next 1-2 weeks.  Return to clinic in 3 months for routine evaluation. 2. Edema: Improving.  Continue treatment and evaluation by primary care.  3. Hypokalemia: Resolved.  Continue oral potassium supplementation.  4. Subdural hematoma: Secondary to fall. Monitor. 5. Anemia: Mild, monitor. 6. Renal insufficiency:  Improved, monitor. 7. Peripheral neuropathy: Continue follow-up with neurology as needed. 8.  Genetic testing: Testing is appropriate given patient's personal history of breast cancer as well as a sister who is deceased from ovarian cancer.  She also has several aunts diagnosed with breast cancer.  Patient's 2 sisters were also tested today.     Patient expressed understanding and was in agreement with this plan. She also understands that She can call clinic at any time with any questions, concerns, or complaints.   Cancer Staging Primary cancer of upper outer quadrant of left female breast [facility]) Staging form: Breast, AJCC 8th Edition - Clinical stage from 07/28/2016: Stage IB (cT2, cN0, cM0, G2, ER: Positive, PR: Positive, HER2: Negative) - Signed by Lloyd Huger, MD on 07/28/2016 - Pathologic stage from 01/10/2017: No Stage Recommended (ypT2, pN0, cM0, G1, ER: Positive, PR: Positive, HER2: Negative) - Signed by Lloyd Huger, MD on 01/10/2017   Lloyd Huger, MD   02/17/2017 9:29 AM

## 2017-02-13 ENCOUNTER — Ambulatory Visit: Payer: PPO | Admitting: Family Medicine

## 2017-02-14 ENCOUNTER — Other Ambulatory Visit: Payer: Self-pay

## 2017-02-14 ENCOUNTER — Inpatient Hospital Stay: Payer: PPO | Attending: Oncology

## 2017-02-14 ENCOUNTER — Inpatient Hospital Stay (HOSPITAL_BASED_OUTPATIENT_CLINIC_OR_DEPARTMENT_OTHER): Payer: PPO | Admitting: Oncology

## 2017-02-14 ENCOUNTER — Encounter: Payer: Self-pay | Admitting: Oncology

## 2017-02-14 ENCOUNTER — Ambulatory Visit: Payer: PPO | Admitting: Oncology

## 2017-02-14 VITALS — BP 111/65 | HR 106 | Temp 96.7°F | Resp 18 | Wt 142.1 lb

## 2017-02-14 DIAGNOSIS — F419 Anxiety disorder, unspecified: Secondary | ICD-10-CM

## 2017-02-14 DIAGNOSIS — G4700 Insomnia, unspecified: Secondary | ICD-10-CM | POA: Insufficient documentation

## 2017-02-14 DIAGNOSIS — M7989 Other specified soft tissue disorders: Secondary | ICD-10-CM | POA: Diagnosis not present

## 2017-02-14 DIAGNOSIS — D649 Anemia, unspecified: Secondary | ICD-10-CM

## 2017-02-14 DIAGNOSIS — Z170 Estrogen receptor positive status [ER+]: Secondary | ICD-10-CM | POA: Insufficient documentation

## 2017-02-14 DIAGNOSIS — Z79899 Other long term (current) drug therapy: Secondary | ICD-10-CM | POA: Diagnosis not present

## 2017-02-14 DIAGNOSIS — R5383 Other fatigue: Secondary | ICD-10-CM

## 2017-02-14 DIAGNOSIS — N2889 Other specified disorders of kidney and ureter: Secondary | ICD-10-CM | POA: Diagnosis not present

## 2017-02-14 DIAGNOSIS — D609 Acquired pure red cell aplasia, unspecified: Secondary | ICD-10-CM

## 2017-02-14 DIAGNOSIS — Z803 Family history of malignant neoplasm of breast: Secondary | ICD-10-CM | POA: Insufficient documentation

## 2017-02-14 DIAGNOSIS — I10 Essential (primary) hypertension: Secondary | ICD-10-CM

## 2017-02-14 DIAGNOSIS — R531 Weakness: Secondary | ICD-10-CM | POA: Diagnosis not present

## 2017-02-14 DIAGNOSIS — Z7982 Long term (current) use of aspirin: Secondary | ICD-10-CM | POA: Diagnosis not present

## 2017-02-14 DIAGNOSIS — C50412 Malignant neoplasm of upper-outer quadrant of left female breast: Secondary | ICD-10-CM | POA: Diagnosis not present

## 2017-02-14 DIAGNOSIS — Z9012 Acquired absence of left breast and nipple: Secondary | ICD-10-CM | POA: Diagnosis not present

## 2017-02-14 DIAGNOSIS — E785 Hyperlipidemia, unspecified: Secondary | ICD-10-CM | POA: Diagnosis not present

## 2017-02-14 DIAGNOSIS — G629 Polyneuropathy, unspecified: Secondary | ICD-10-CM

## 2017-02-14 DIAGNOSIS — S065X9S Traumatic subdural hemorrhage with loss of consciousness of unspecified duration, sequela: Secondary | ICD-10-CM | POA: Insufficient documentation

## 2017-02-14 DIAGNOSIS — Z7902 Long term (current) use of antithrombotics/antiplatelets: Secondary | ICD-10-CM | POA: Diagnosis not present

## 2017-02-14 LAB — CBC WITH DIFFERENTIAL/PLATELET
Basophils Absolute: 0 10*3/uL (ref 0–0.1)
Basophils Relative: 1 %
EOS PCT: 5 %
Eosinophils Absolute: 0.3 10*3/uL (ref 0–0.7)
HCT: 33.3 % — ABNORMAL LOW (ref 35.0–47.0)
Hemoglobin: 11.1 g/dL — ABNORMAL LOW (ref 12.0–16.0)
LYMPHS ABS: 1.2 10*3/uL (ref 1.0–3.6)
LYMPHS PCT: 21 %
MCH: 30 pg (ref 26.0–34.0)
MCHC: 33.4 g/dL (ref 32.0–36.0)
MCV: 89.7 fL (ref 80.0–100.0)
MONO ABS: 0.4 10*3/uL (ref 0.2–0.9)
MONOS PCT: 7 %
Neutro Abs: 3.9 10*3/uL (ref 1.4–6.5)
Neutrophils Relative %: 66 %
PLATELETS: 293 10*3/uL (ref 150–440)
RBC: 3.71 MIL/uL — AB (ref 3.80–5.20)
RDW: 14.5 % (ref 11.5–14.5)
WBC: 5.9 10*3/uL (ref 3.6–11.0)

## 2017-02-14 LAB — COMPREHENSIVE METABOLIC PANEL
ALK PHOS: 53 U/L (ref 38–126)
ALT: 19 U/L (ref 14–54)
AST: 34 U/L (ref 15–41)
Albumin: 3.7 g/dL (ref 3.5–5.0)
Anion gap: 12 (ref 5–15)
BILIRUBIN TOTAL: 0.4 mg/dL (ref 0.3–1.2)
BUN: 21 mg/dL — AB (ref 6–20)
CHLORIDE: 97 mmol/L — AB (ref 101–111)
CO2: 29 mmol/L (ref 22–32)
Calcium: 10 mg/dL (ref 8.9–10.3)
Creatinine, Ser: 1.37 mg/dL — ABNORMAL HIGH (ref 0.44–1.00)
GFR, EST AFRICAN AMERICAN: 41 mL/min — AB (ref >60)
GFR, EST NON AFRICAN AMERICAN: 36 mL/min — AB (ref >60)
GLUCOSE: 97 mg/dL (ref 65–99)
POTASSIUM: 3.6 mmol/L (ref 3.5–5.1)
Sodium: 138 mmol/L (ref 135–145)
Total Protein: 7 g/dL (ref 6.5–8.1)

## 2017-02-14 MED ORDER — LETROZOLE 2.5 MG PO TABS: 2.5000 mg | ORAL_TABLET | Freq: Every day | ORAL | 3 refills | Status: DC

## 2017-02-14 NOTE — Progress Notes (Signed)
Patient reports continued weakness, neuropathy in legs.

## 2017-02-15 DIAGNOSIS — I10 Essential (primary) hypertension: Secondary | ICD-10-CM | POA: Diagnosis not present

## 2017-02-15 DIAGNOSIS — E782 Mixed hyperlipidemia: Secondary | ICD-10-CM | POA: Diagnosis not present

## 2017-02-15 DIAGNOSIS — I6523 Occlusion and stenosis of bilateral carotid arteries: Secondary | ICD-10-CM | POA: Diagnosis not present

## 2017-02-22 ENCOUNTER — Other Ambulatory Visit: Payer: Self-pay | Admitting: Oncology

## 2017-03-05 ENCOUNTER — Other Ambulatory Visit: Payer: Self-pay | Admitting: Oncology

## 2017-03-05 ENCOUNTER — Other Ambulatory Visit: Payer: Self-pay | Admitting: Family Medicine

## 2017-03-08 ENCOUNTER — Inpatient Hospital Stay: Payer: PPO | Attending: Oncology

## 2017-03-08 DIAGNOSIS — Z452 Encounter for adjustment and management of vascular access device: Secondary | ICD-10-CM | POA: Insufficient documentation

## 2017-03-08 DIAGNOSIS — Z170 Estrogen receptor positive status [ER+]: Secondary | ICD-10-CM | POA: Insufficient documentation

## 2017-03-08 DIAGNOSIS — C50412 Malignant neoplasm of upper-outer quadrant of left female breast: Secondary | ICD-10-CM | POA: Insufficient documentation

## 2017-03-08 DIAGNOSIS — Z95828 Presence of other vascular implants and grafts: Secondary | ICD-10-CM

## 2017-03-08 MED ORDER — SODIUM CHLORIDE 0.9% FLUSH
10.0000 mL | INTRAVENOUS | Status: AC | PRN
  Administered 2017-03-08: 10 mL
  Filled 2017-03-08: qty 10

## 2017-03-08 MED ORDER — HEPARIN SOD (PORK) LOCK FLUSH 100 UNIT/ML IV SOLN
500.0000 [IU] | INTRAVENOUS | Status: AC | PRN
  Administered 2017-03-08: 500 [IU]

## 2017-03-23 ENCOUNTER — Other Ambulatory Visit: Payer: Self-pay | Admitting: Family Medicine

## 2017-03-28 ENCOUNTER — Ambulatory Visit
Admission: RE | Admit: 2017-03-28 | Discharge: 2017-03-28 | Disposition: A | Discharge status: Home / Self Care | Payer: PPO | Source: Ambulatory Visit | Attending: Oncology | Admitting: Oncology

## 2017-03-28 DIAGNOSIS — C50412 Malignant neoplasm of upper-outer quadrant of left female breast: Secondary | ICD-10-CM | POA: Insufficient documentation

## 2017-03-28 DIAGNOSIS — M85852 Other specified disorders of bone density and structure, left thigh: Secondary | ICD-10-CM | POA: Diagnosis not present

## 2017-03-28 DIAGNOSIS — Z780 Asymptomatic menopausal state: Secondary | ICD-10-CM | POA: Diagnosis not present

## 2017-04-04 ENCOUNTER — Encounter: Payer: Self-pay | Admitting: General Surgery

## 2017-04-04 ENCOUNTER — Ambulatory Visit (INDEPENDENT_AMBULATORY_CARE_PROVIDER_SITE_OTHER): Payer: PPO | Admitting: General Surgery

## 2017-04-04 VITALS — BP 124/56 | HR 114 | Resp 14 | Ht 69.0 in | Wt 143.0 lb

## 2017-04-04 DIAGNOSIS — Z170 Estrogen receptor positive status [ER+]: Secondary | ICD-10-CM

## 2017-04-04 DIAGNOSIS — C50412 Malignant neoplasm of upper-outer quadrant of left female breast: Secondary | ICD-10-CM | POA: Diagnosis not present

## 2017-04-04 NOTE — Progress Notes (Signed)
Patient ID: Sandra Fritz, female   DOB: 1934-10-09, 83 y.o.   MRN: [id number]  Chief Complaint  Patient presents with  . Breast Cancer    HPI Sandra Fritz is a 83 y.o. female here today for her follow up breast cancer. Patient states she has been taking losartan and she stop taking it due to it may cause cancer.  I reviewed with the patient that this was an issue only with medication provided by [employer].  This is not who supplied her most recent medication, and she can check with her pharmacy to confirm that none of the medication she received came from the supplier.  She states she feels occasional dizziness and her right leg feels weak. This has been having about two times a week.  HPI  Past Medical History:  Diagnosis Date  . Anemia   . Anxiety   . Arthritis   . Blood transfusion without reported diagnosis   . Breast cancer ([place])   . Cataract   . Complication of anesthesia    HARD TO WAKE UP AFTER TONSILLECTOMY  . GERD (gastroesophageal reflux disease)   . Hyperlipidemia   . Hypertension   . Neuromuscular disorder ([place])   . Personal history of chemotherapy   . Primary cancer of upper outer quadrant of left female breast ([place]) 07/28/2016   2.7 cm, T2, ER/PR +, her 2 neu negative prior to neoadjuvant chemotherapy    Past Surgical History:  Procedure Laterality Date  . APPENDECTOMY  1955   SECONDARY TO RUTURED APPENDIX  . CATARACT EXTRACTION, BILATERAL  2011  . COLONOSCOPY  2002  . DILATION AND CURETTAGE OF UTERUS  1989   Dr. Laurey Morale  . JOINT REPLACEMENT  2012   Right hip  . MASTECTOMY W/ SENTINEL NODE BIOPSY Left 12/30/2016   Procedure: LEFT MASTECTOMY WITH SENTINEL LYMPH NODE BIOPSY;  Surgeon: Robert Bellow, MD;  Location: [facility] ORS;  Service: General;  Laterality: Left;  . MOLE REMOVAL  1957   18 removed by Dr. Hoyle Sauer  . NECK SURGERY  2008   metal in neck  . PORTACATH PLACEMENT Right 08/05/2016   Procedure: INSERTION PORT-A-CATH WITH  ULTRASOUND;  Surgeon: Robert Bellow, MD;  Location: [facility] ORS;  Service: General;  Laterality: Right;  . TONSILLECTOMY  1955    Family History  Problem Relation Age of Onset  . Macular degeneration Mother   . Hypertension Mother   . Diabetes Father   . Heart disease Father   . Heart attack Father 74  . Heart disease Brother 20  . Dermatomyositis Sister   . Cancer Sister        Carcinoma of the uterine or ovarian all attached to colon and is in remission now  . Breast cancer Maternal Aunt 70  . Breast cancer Paternal Aunt 18  . Breast cancer Cousin        paternal aunt w/ breast ca daughter  . Breast cancer Maternal Aunt 60    Social History Social History   Tobacco Use  . Smoking status: Never Smoker  . Smokeless tobacco: Never Used  Substance Use Topics  . Alcohol use: Yes    Alcohol/week: 0.6 oz    Types: 1 Glasses of wine per week    Comment: Maybe 3 per year  . Drug use: No    Allergies  Allergen Reactions  . Erythromycin Rash and Other (See Comments)    ALL MYCINS  . Keflex [Cephalexin] Hives    Patient  does not remember  . Statins Other (See Comments)    Muscle and joint pain-patient states she tried all statins.    Current Outpatient Medications  Medication Sig Dispense Refill  . ALPRAZolam (XANAX) 0.25 MG tablet TAKE 1 TABLET BY MOUTH AT BEDTIME AS NEEDED FOR ANXIETY 30 tablet 0  . aspirin 81 MG tablet Take 81 mg by mouth daily.     Marland Kitchen esomeprazole (NEXIUM) 20 MG capsule Take 1 capsule (20 mg total) by mouth daily. 90 capsule 3  . furosemide (LASIX) 20 MG tablet TAKE 1 TABLET (20 MG TOTAL) BY MOUTH 2 (TWO) TIMES DAILY. 180 tablet 3  . gabapentin (NEURONTIN) 100 MG capsule Take 1 capsule (100 mg total) at bedtime by mouth. Take at suppertime 90 capsule 3  . KLOR-CON M20 20 MEQ tablet TAKE 1 TABLET BY MOUTH TWICE A DAY 60 tablet 0  . letrozole (FEMARA) 2.5 MG tablet Take 1 tablet (2.5 mg total) by mouth daily. 30 tablet 3  . lidocaine-prilocaine (EMLA)  cream Apply 1 application topically as needed. Apply to port 1 hour prior to chemotherapy appointment. Cover with plastic wrap. 30 g 2  . losartan (COZAAR) 100 MG tablet Take 1 tablet (100 mg total) by mouth daily. 90 tablet 3  . metolazone (ZAROXOLYN) 2.5 MG tablet     . Misc Natural Products (OSTEO BI-FLEX JOINT SHIELD PO) Take 2 tablets by mouth daily.    . Multiple Vitamin (MULTIVITAMIN WITH MINERALS) TABS tablet Take 1 tablet by mouth daily.     . rosuvastatin (CRESTOR) 5 MG tablet TAKE 1 TABLET (5 MG TOTAL) BY MOUTH EVERY OTHER DAY. 45 tablet 3  . traMADol (ULTRAM) 50 MG tablet Take 50 mg by mouth 2 (two) times daily as needed.  5   No current facility-administered medications for this visit.     Review of Systems Review of Systems  Constitutional: Negative.   Respiratory: Negative.   Cardiovascular: Negative.     Blood pressure (!) 124/56, pulse (!) 114, resp. rate 14, height 5\' 9"  (1.753 m), weight 143 lb (64.9 kg).  Physical Exam Physical Exam  Constitutional: She appears well-developed and well-nourished.  Cardiovascular: Normal rate, regular rhythm and normal heart sounds.  Pulmonary/Chest: Effort normal.  Left mastectomy site is clean and well healed.   Neurological: She is alert.  Skin: Skin is warm.  Examination of the right knee shows no pathology.  Lower extremity muscle strength testing was symmetrical.     Assessment    Doing well post left mastectomy.  Mildly low blood pressure, likely related to weight loss during adjuvant chemotherapy.  She will discuss a lower dose of her antihypertensive with her PCP.    Plan     She will continue to take Femara 2.5 mg daily.     Return in April 2019 right diagnotic mammogram. The patient is aware to call back for any questions or concerns.  HPI, Physical Exam, Assessment and Plan have been scribed under the direction and in the presence of Hervey Ard, MD.  Gaspar Cola, CMA  I have completed the  exam and reviewed the above documentation for accuracy and completeness.  I agree with the above.  Haematologist has been used and any errors in dictation or transcription are unintentional.  Hervey Ard, M.D., F.A.C.S.   Sandra Fritz 04/05/2017, 8:16 PM

## 2017-04-04 NOTE — Patient Instructions (Signed)
Return in April 2019 right diagnotic mammogram. The patient is aware to call back for any questions or concerns.

## 2017-04-11 ENCOUNTER — Encounter: Payer: Self-pay | Admitting: Family Medicine

## 2017-04-11 ENCOUNTER — Ambulatory Visit (INDEPENDENT_AMBULATORY_CARE_PROVIDER_SITE_OTHER): Payer: PPO | Admitting: Family Medicine

## 2017-04-11 VITALS — BP 112/64 | HR 88 | Temp 97.6°F | Resp 16 | Wt 140.0 lb

## 2017-04-11 DIAGNOSIS — C50412 Malignant neoplasm of upper-outer quadrant of left female breast: Secondary | ICD-10-CM

## 2017-04-11 DIAGNOSIS — G629 Polyneuropathy, unspecified: Secondary | ICD-10-CM | POA: Diagnosis not present

## 2017-04-11 DIAGNOSIS — R4182 Altered mental status, unspecified: Secondary | ICD-10-CM | POA: Diagnosis not present

## 2017-04-11 NOTE — Patient Instructions (Signed)
Stop taking as of today Tramadol, Gabapentin, Xanax, Potassium supplements, Lasix.

## 2017-04-11 NOTE — Progress Notes (Signed)
Patient: Sandra Fritz Female    DOB: July 06, 1934   83 y.o.   MRN: [id number] Visit Date: 04/11/2017  Today's Provider: Wilhemena Durie, MD   Chief Complaint  Patient presents with  . Follow-up   Subjective:    HPI Pt is here today for a follow up. She reports that she just has not been feeling herself since she had the Chemo. She wants to know if some of the medications are causing this or if it is the chemo. Pt is concerned about her cognitive function. She reports that since her Chemo therapy she has episodes of disorientation. She reports that she will go to run errands and get to the end of her drive way and forget where she is going.  Also she is very anxious at night. She gets up and is looking out the windows, cant sleep. She feels jittery.       Allergies  Allergen Reactions  . Erythromycin Rash and Other (See Comments)    ALL MYCINS  . Keflex [Cephalexin] Hives    Patient does not remember  . Statins Other (See Comments)    Muscle and joint pain-patient states she tried all statins.     Current Outpatient Medications:  .  ALPRAZolam (XANAX) 0.25 MG tablet, TAKE 1 TABLET BY MOUTH AT BEDTIME AS NEEDED FOR ANXIETY, Disp: 30 tablet, Rfl: 0 .  aspirin 81 MG tablet, Take 81 mg by mouth daily. , Disp: , Rfl:  .  esomeprazole (NEXIUM) 20 MG capsule, Take 1 capsule (20 mg total) by mouth daily., Disp: 90 capsule, Rfl: 3 .  furosemide (LASIX) 20 MG tablet, TAKE 1 TABLET (20 MG TOTAL) BY MOUTH 2 (TWO) TIMES DAILY., Disp: 180 tablet, Rfl: 3 .  gabapentin (NEURONTIN) 100 MG capsule, Take 1 capsule (100 mg total) at bedtime by mouth. Take at suppertime, Disp: 90 capsule, Rfl: 3 .  KLOR-CON M20 20 MEQ tablet, TAKE 1 TABLET BY MOUTH TWICE A DAY, Disp: 60 tablet, Rfl: 0 .  letrozole (FEMARA) 2.5 MG tablet, Take 1 tablet (2.5 mg total) by mouth daily., Disp: 30 tablet, Rfl: 3 .  losartan (COZAAR) 100 MG tablet, Take 1 tablet (100 mg total) by mouth daily., Disp: 90 tablet,  Rfl: 3 .  metolazone (ZAROXOLYN) 2.5 MG tablet, , Disp: , Rfl:  .  Misc Natural Products (OSTEO BI-FLEX JOINT SHIELD PO), Take 2 tablets by mouth daily., Disp: , Rfl:  .  Multiple Vitamin (MULTIVITAMIN WITH MINERALS) TABS tablet, Take 1 tablet by mouth daily. , Disp: , Rfl:  .  prochlorperazine (COMPAZINE) 10 MG tablet, Take 10 mg by mouth every 6 (six) hours as needed for nausea or vomiting., Disp: , Rfl:  .  rosuvastatin (CRESTOR) 5 MG tablet, TAKE 1 TABLET (5 MG TOTAL) BY MOUTH EVERY OTHER DAY., Disp: 45 tablet, Rfl: 3 .  sucralfate (CARAFATE) 1 g tablet, Take 1 g by mouth 4 (four) times daily -  with meals and at bedtime., Disp: , Rfl:  .  traMADol (ULTRAM) 50 MG tablet, Take 50 mg by mouth 2 (two) times daily as needed., Disp: , Rfl: 5 .  lidocaine-prilocaine (EMLA) cream, Apply 1 application topically as needed. Apply to port 1 hour prior to chemotherapy appointment. Cover with plastic wrap. (Patient not taking: Reported on 04/11/2017), Disp: 30 g, Rfl: 2  Review of Systems  Constitutional: Positive for fatigue.  HENT: Negative.   Eyes: Negative.   Respiratory: Negative.   Cardiovascular: Negative.  Gastrointestinal: Negative.   Endocrine: Negative.   Genitourinary: Negative.   Musculoskeletal: Negative.   Skin: Negative.   Allergic/Immunologic: Negative.   Neurological: Positive for dizziness, weakness and headaches.  Hematological: Negative.   Psychiatric/Behavioral: Positive for confusion. The patient is nervous/anxious.     Social History   Tobacco Use  . Smoking status: Never Smoker  . Smokeless tobacco: Never Used  Substance Use Topics  . Alcohol use: Yes    Alcohol/week: 0.6 oz    Types: 1 Glasses of wine per week    Comment: Maybe 3 per year   Objective:   BP 112/64 (BP Location: Left Arm, Patient Position: Sitting, Cuff Size: Normal)   Pulse 88   Temp 97.6 F (36.4 C) (Oral)   Resp 16   Wt 140 lb (63.5 kg)   BMI 20.67 kg/m  Vitals:   04/11/17 1401  BP:  112/64  Pulse: 88  Resp: 16  Temp: 97.6 F (36.4 C)  TempSrc: Oral  Weight: 140 lb (63.5 kg)     Physical Exam  Constitutional: She is oriented to person, place, and time. She appears well-developed and well-nourished.  HENT:  Head: Normocephalic and atraumatic.  Right Ear: External ear normal.  Left Ear: External ear normal.  Nose: Nose normal.  Eyes: Conjunctivae are normal.  Neck: No thyromegaly present.  Cardiovascular: Normal rate, regular rhythm and normal heart sounds.  Pulmonary/Chest: Effort normal and breath sounds normal.  Abdominal: Soft.  Neurological: She is alert and oriented to person, place, and time.  Skin: Skin is warm and dry.  Psychiatric: She has a normal mood and affect. Her behavior is normal. Judgment and thought content normal.        Assessment & Plan:     1. Altered mental status, unspecified altered mental status type Ok today,possible side effects from chemo or other meds.  Stop Tramadol,Xanax,Gabapentin,lasix. 2.Breast Cancer 3.Neuropathy RTC 2-4 weeks.       I have done the exam and reviewed the above chart and it is accurate to the best of my knowledge. Development worker, community has been used in this note in any air is in the dictation or transcription are unintentional.   Wilhemena Durie, MD  [place]

## 2017-04-12 DIAGNOSIS — G4700 Insomnia, unspecified: Secondary | ICD-10-CM | POA: Diagnosis not present

## 2017-04-12 DIAGNOSIS — R2689 Other abnormalities of gait and mobility: Secondary | ICD-10-CM | POA: Diagnosis not present

## 2017-04-12 DIAGNOSIS — T451X5A Adverse effect of antineoplastic and immunosuppressive drugs, initial encounter: Secondary | ICD-10-CM | POA: Diagnosis not present

## 2017-04-12 DIAGNOSIS — R413 Other amnesia: Secondary | ICD-10-CM | POA: Diagnosis not present

## 2017-04-12 DIAGNOSIS — R55 Syncope and collapse: Secondary | ICD-10-CM | POA: Diagnosis not present

## 2017-04-12 DIAGNOSIS — G620 Drug-induced polyneuropathy: Secondary | ICD-10-CM | POA: Diagnosis not present

## 2017-04-24 ENCOUNTER — Other Ambulatory Visit: Payer: Self-pay | Admitting: Family Medicine

## 2017-04-24 ENCOUNTER — Other Ambulatory Visit: Payer: Self-pay | Admitting: Oncology

## 2017-04-24 DIAGNOSIS — K219 Gastro-esophageal reflux disease without esophagitis: Secondary | ICD-10-CM

## 2017-04-24 NOTE — Telephone Encounter (Signed)
Pharmacy requesting refills. Thanks!  

## 2017-04-24 NOTE — Telephone Encounter (Signed)
Dx:  Primary cancer of upper outer quadran...   Ref Range & Units 64mo ago  Potassium 3.5 - 5.1 mmol/L 3.6

## 2017-05-03 ENCOUNTER — Ambulatory Visit (INDEPENDENT_AMBULATORY_CARE_PROVIDER_SITE_OTHER): Payer: PPO | Admitting: Family Medicine

## 2017-05-03 VITALS — BP 140/62 | HR 92 | Temp 97.5°F | Resp 16 | Wt 141.0 lb

## 2017-05-03 DIAGNOSIS — C50412 Malignant neoplasm of upper-outer quadrant of left female breast: Secondary | ICD-10-CM

## 2017-05-03 DIAGNOSIS — R4182 Altered mental status, unspecified: Secondary | ICD-10-CM | POA: Diagnosis not present

## 2017-05-03 DIAGNOSIS — R5383 Other fatigue: Secondary | ICD-10-CM | POA: Diagnosis not present

## 2017-05-03 NOTE — Patient Instructions (Signed)
Stop Metolazone and continue Furosemide.

## 2017-05-03 NOTE — Progress Notes (Signed)
Patient: Sandra Fritz Female    DOB: 1934-10-01   83 y.o.   MRN: [id number] Visit Date: 05/03/2017  Today's Provider: Wilhemena Durie, MD   Chief Complaint  Patient presents with  . Follow-up   Subjective:    HPI Pt is here for a follow up of altered mental status and insomnia. She was taken of off Tramadol, Xanax, Gabapentin and lasix. She reports that her mental status seems to have gotten better. She is still not sleeping. She has trouble falling asleep and staying a sleep. Pt also wanted to know if she was suppose to stop her lasix (per last note she was suppose to stop it but she did not not)      Allergies  Allergen Reactions  . Erythromycin Rash and Other (See Comments)    ALL MYCINS  . Keflex [Cephalexin] Hives    Patient does not remember  . Statins Other (See Comments)    Muscle and joint pain-patient states she tried all statins.     Current Outpatient Medications:  .  aspirin 81 MG tablet, Take 81 mg by mouth daily. , Disp: , Rfl:  .  esomeprazole (NEXIUM) 20 MG capsule, Take 1 capsule (20 mg total) by mouth daily., Disp: 90 capsule, Rfl: 3 .  furosemide (LASIX) 20 MG tablet, Take 20 mg by mouth 2 (two) times daily., Disp: , Rfl:  .  KLOR-CON M20 20 MEQ tablet, TAKE 1 TABLET BY MOUTH TWICE A DAY, Disp: 60 tablet, Rfl: 0 .  letrozole (FEMARA) 2.5 MG tablet, Take 1 tablet (2.5 mg total) by mouth daily., Disp: 30 tablet, Rfl: 3 .  losartan (COZAAR) 100 MG tablet, Take 1 tablet (100 mg total) by mouth daily., Disp: 90 tablet, Rfl: 3 .  metolazone (ZAROXOLYN) 2.5 MG tablet, , Disp: , Rfl:  .  Misc Natural Products (OSTEO BI-FLEX JOINT SHIELD PO), Take 2 tablets by mouth daily., Disp: , Rfl:  .  Multiple Vitamin (MULTIVITAMIN WITH MINERALS) TABS tablet, Take 1 tablet by mouth daily. , Disp: , Rfl:  .  sucralfate (CARAFATE) 1 g tablet, TAKE 1 TABLET (1 G TOTAL) BY MOUTH 4 (FOUR) TIMES DAILY - WITH MEALS AND AT BEDTIME., Disp: 120 tablet, Rfl: 4 .   lidocaine-prilocaine (EMLA) cream, Apply 1 application topically as needed. Apply to port 1 hour prior to chemotherapy appointment. Cover with plastic wrap. (Patient not taking: Reported on 04/11/2017), Disp: 30 g, Rfl: 2 .  prochlorperazine (COMPAZINE) 10 MG tablet, Take 10 mg by mouth every 6 (six) hours as needed for nausea or vomiting., Disp: , Rfl:  .  rosuvastatin (CRESTOR) 5 MG tablet, TAKE 1 TABLET (5 MG TOTAL) BY MOUTH EVERY OTHER DAY. (Patient not taking: Reported on 05/03/2017), Disp: 45 tablet, Rfl: 3  Review of Systems  Constitutional: Positive for fatigue.  HENT: Negative.   Eyes: Negative.   Respiratory: Negative.   Cardiovascular: Negative.   Gastrointestinal: Negative.   Endocrine: Negative.   Genitourinary: Negative.   Musculoskeletal: Negative.   Skin: Negative.   Allergic/Immunologic: Negative.   Neurological: Negative.   Hematological: Negative.   Psychiatric/Behavioral: Negative.     Social History   Tobacco Use  . Smoking status: Never Smoker  . Smokeless tobacco: Never Used  Substance Use Topics  . Alcohol use: Yes    Alcohol/week: 0.6 oz    Types: 1 Glasses of wine per week    Comment: Maybe 3 per year   Objective:   BP  140/62 (BP Location: Left Arm, Patient Position: Sitting, Cuff Size: Normal)   Pulse 92   Temp (!) 97.5 F (36.4 C) (Oral)   Resp 16   Wt 141 lb (64 kg)   BMI 20.82 kg/m  Vitals:   05/03/17 1352  BP: 140/62  Pulse: 92  Resp: 16  Temp: (!) 97.5 F (36.4 C)  TempSrc: Oral  Weight: 141 lb (64 kg)     Physical Exam  Constitutional: She is oriented to person, place, and time. She appears well-developed and well-nourished.  HENT:  Head: Normocephalic and atraumatic.  Right Ear: External ear normal.  Left Ear: External ear normal.  Nose: Nose normal.  Eyes: Conjunctivae are normal. No scleral icterus.  Cardiovascular: Normal rate, regular rhythm and normal heart sounds.  Pulmonary/Chest: Effort normal and breath sounds  normal.  Abdominal: Soft.  Neurological: She is alert and oriented to person, place, and time.  Skin: Skin is warm and dry.  Psychiatric: She has a normal mood and affect. Her behavior is normal. Judgment and thought content normal.        Assessment & Plan:     1. Altered mental status, unspecified altered mental status type She feels better after stopping some meds.  2. Other fatigue 4.Venous Insufficiency 3.Mild dehydration D/c Metolazone.  Continue prn lasix. 5.Breast Cancer  I have done the exam and reviewed the chart and it is accurate to the best of my knowledge. Development worker, community has been used and  any errors in dictation or transcription are unintentional. Miguel Aschoff M.D. [place], MD  [facility]

## 2017-05-14 NOTE — Progress Notes (Signed)
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  INTERVAL HISTORY: Patient returns to clinic today for repeat laboratory work and further evaluation.  She is tolerating letrozole well without significant side effects.  She does not complain of weakness or fatigue today.  She continues to have insomnia secondary to peripheral neuropathy, but has not yet started gabapentin. She has no other neurologic complaints. She denies any recent fevers or illnesses. She has no chest pain or shortness of breath. She denies any nausea, vomiting, constipation, or diarrhea. She has no urinary complaints. Patient offers no specific complaints today.  REVIEW OF SYSTEMS:   Review of Systems  Constitutional: Negative.  Negative for fever, malaise/fatigue and weight loss.  HENT: Negative for sore throat.   Eyes: Negative for blurred vision, double vision and pain.  Respiratory: Negative.  Negative for cough and shortness of breath.   Cardiovascular: Positive for leg swelling. Negative for chest pain.  Gastrointestinal: Negative.  Negative for abdominal pain.  Genitourinary: Negative.   Musculoskeletal: Negative.   Skin: Negative.  Negative for rash.  Neurological: Positive for tingling and sensory change. Negative for weakness and headaches.  Psychiatric/Behavioral: The patient has insomnia. The patient is not nervous/anxious.     As per HPI. Otherwise, a complete review of systems is negative.  PAST MEDICAL HISTORY: Past Medical History:  Diagnosis Date  . Anemia   . Anxiety   . Arthritis   .  Blood transfusion without reported diagnosis   . Breast cancer ([place])   . Cataract   . Complication of anesthesia    HARD TO WAKE UP AFTER TONSILLECTOMY  . GERD (gastroesophageal reflux disease)   . Hyperlipidemia   . Hypertension   . Neuromuscular disorder ([place])   . Personal history of chemotherapy   . Primary cancer of upper outer quadrant of left female breast ([place]) 07/28/2016   2.7 cm, T2, ER/PR +, her 2 neu negative prior to neoadjuvant chemotherapy    PAST SURGICAL HISTORY: Past Surgical History:  Procedure Laterality Date  . APPENDECTOMY  1955   SECONDARY TO RUTURED APPENDIX  . CATARACT EXTRACTION, BILATERAL  2011  . COLONOSCOPY  2002  . DILATION AND CURETTAGE OF UTERUS  1989   Dr. Laurey Morale  . JOINT REPLACEMENT  2012   Right hip  . MASTECTOMY W/ SENTINEL NODE BIOPSY Left 12/30/2016   Procedure: LEFT MASTECTOMY WITH SENTINEL LYMPH NODE BIOPSY;  Surgeon: Robert Bellow, MD;  Location: [facility] ORS;  Service: General;  Laterality: Left;  . MOLE REMOVAL  1957   18 removed by Dr. Hoyle Sauer  . NECK SURGERY  2008   metal in neck  . PORTACATH PLACEMENT Right 08/05/2016   Procedure: INSERTION PORT-A-CATH WITH ULTRASOUND;  Surgeon: Robert Bellow, MD;  Location: [facility] ORS;  Service: General;  Laterality: Right;  . TONSILLECTOMY  1955    FAMILY HISTORY: Family History  Problem Relation Age of Onset  . Macular degeneration Mother   . Hypertension Mother   . Diabetes Father   . Heart disease Father   . Heart attack Father 82  . Heart disease Brother 29  . Dermatomyositis Sister   .  Cancer Sister        Carcinoma of the uterine or ovarian all attached to colon and is in remission now  . Breast cancer Maternal Aunt 70  . Breast cancer Paternal Aunt 75  . Breast cancer Cousin        paternal aunt w/ breast ca daughter  . Breast cancer Maternal Aunt 60    ADVANCED DIRECTIVES (Y/N):  N  HEALTH MAINTENANCE: Social History   Tobacco Use  . Smoking status: Never Smoker   . Smokeless tobacco: Never Used  Substance Use Topics  . Alcohol use: Yes    Alcohol/week: 0.6 oz    Types: 1 Glasses of wine per week    Comment: Maybe 3 per year  . Drug use: No     Colonoscopy:  PAP:  Bone density:  Lipid panel:  Allergies  Allergen Reactions  . Erythromycin Rash and Other (See Comments)    ALL MYCINS  . Keflex [Cephalexin] Hives    Patient does not remember  . Statins Other (See Comments)    Muscle and joint pain-patient states she tried all statins.    Current Outpatient Medications  Medication Sig Dispense Refill  . aspirin 81 MG tablet Take 81 mg by mouth daily.     Marland Kitchen esomeprazole (NEXIUM) 20 MG capsule Take 1 capsule (20 mg total) by mouth daily. 90 capsule 3  . furosemide (LASIX) 20 MG tablet Take 20 mg by mouth 2 (two) times daily.    Marland Kitchen gabapentin (NEURONTIN) 100 MG capsule Take 2 capsules by mouth 2 (two) times daily.    Marland Kitchen KLOR-CON M20 20 MEQ tablet TAKE 1 TABLET BY MOUTH TWICE A DAY 60 tablet 0  . letrozole (FEMARA) 2.5 MG tablet Take 1 tablet (2.5 mg total) by mouth daily. 30 tablet 3  . lidocaine-prilocaine (EMLA) cream Apply 1 application topically as needed. Apply to port 1 hour prior to chemotherapy appointment. Cover with plastic wrap. 30 g 2  . losartan (COZAAR) 100 MG tablet Take 1 tablet (100 mg total) by mouth daily. 90 tablet 3  . metolazone (ZAROXOLYN) 2.5 MG tablet     . Misc Natural Products (OSTEO BI-FLEX JOINT SHIELD PO) Take 2 tablets by mouth daily.    . Multiple Vitamin (MULTIVITAMIN WITH MINERALS) TABS tablet Take 1 tablet by mouth daily.     . prochlorperazine (COMPAZINE) 10 MG tablet Take 10 mg by mouth every 6 (six) hours as needed for nausea or vomiting.    . rosuvastatin (CRESTOR) 5 MG tablet TAKE 1 TABLET (5 MG TOTAL) BY MOUTH EVERY OTHER DAY. 45 tablet 3  . sucralfate (CARAFATE) 1 g tablet TAKE 1 TABLET (1 G TOTAL) BY MOUTH 4 (FOUR) TIMES DAILY - WITH MEALS AND AT BEDTIME. 120 tablet 4   No current  facility-administered medications for this visit.     OBJECTIVE: Vitals:   05/17/17 1417 05/17/17 1426  BP:  138/84  Pulse:  84  Resp: 16   Temp:  (!) 97.3 F (36.3 C)     Body mass index is 21.41 kg/m.    ECOG FS:1 - Symptomatic but completely ambulatory  General: Well-developed, well-nourished, no acute distress. Eyes: Pink conjunctiva, anicteric sclera.   Breasts: Well-healing scar on left breast. HEENT: Clear oropharynx. Lungs: Clear to auscultation bilaterally. Heart: Regular rate and rhythm. No rubs, murmurs, or gallops. Abdomen: Soft, nontender, nondistended. No organomegaly noted, normoactive bowel sounds. Musculoskeletal: 1-2+ bilateral edema. Neuro: Alert, answering all questions appropriately. Cranial nerves grossly intact. Skin:  No rashes or petechiae noted. Psych: Normal affect.   LAB RESULTS:  Lab Results  Component Value Date   NA 139 05/17/2017   K 3.4 (L) 05/17/2017   CL 103 05/17/2017   CO2 26 05/17/2017   GLUCOSE 111 (H) 05/17/2017   BUN 16 05/17/2017   CREATININE 0.99 05/17/2017   CALCIUM 9.9 05/17/2017   PROT 7.0 05/17/2017   ALBUMIN 3.9 05/17/2017   AST 28 05/17/2017   ALT 22 05/17/2017   ALKPHOS 53 05/17/2017   BILITOT 0.5 05/17/2017   GFRNONAA 52 (L) 05/17/2017   GFRAA >60 05/17/2017    Lab Results  Component Value Date   WBC 4.8 05/17/2017   NEUTROABS 3.0 05/17/2017   HGB 10.4 (L) 05/17/2017   HCT 30.7 (L) 05/17/2017   MCV 87.4 05/17/2017   PLT 288 05/17/2017     STUDIES: No results found.  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  PLAN:    1. 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 She only received 3 cycles of weekly Taxol, her last one on November 08, 2016. Given her worsening performance status  and peripheral neuropathy treatment was discontinued altogether. She had a total mastectomy on December 30, 2016 revealing residual disease. She did not require adjuvant XRT.  Continue letrozole which she will be required to take for a total of 5 years completing in November 2023. Return to clinic in 6 months for routine evaluation. 2.  Osteopenia: Patient had a baseline bone marrow density on March 28, 2017 that reported T score of -1.7.  Continue calcium and vitamin D supplementation.  Repeat in 1 year 3.  Peripheral neuropathy: Patient has been instructed to initiate her gabapentin as prescribed.  Continue follow-up and evaluation per primary care. 4. Hypokalemia: Mild.  Continue oral potassium supplementation.  5. Anemia: Mild, monitor. 6. Renal insufficiency: Resolved. 7. Peripheral neuropathy: Continue follow-up with neurology as needed. 8.  Genetic testing: Negative.   Patient expressed understanding and was in agreement with this plan. She also understands that She can call clinic at any time with any questions, concerns, or complaints.   Cancer Staging Primary cancer of upper outer quadrant of left female breast [facility]) Staging form: Breast, AJCC 8th Edition - Clinical stage from 07/28/2016: Stage IB (cT2, cN0, cM0, G2, ER: Positive, PR: Positive, HER2: Negative) - Signed by Lloyd Huger, MD on 07/28/2016 - Pathologic stage from 01/10/2017: No Stage Recommended (ypT2, pN0, cM0, G1, ER: Positive, PR: Positive, HER2: Negative) - Signed by Lloyd Huger, MD on 01/10/2017   Lloyd Huger, MD   05/19/2017 2:43 PM

## 2017-05-17 ENCOUNTER — Inpatient Hospital Stay: Payer: PPO

## 2017-05-17 ENCOUNTER — Other Ambulatory Visit: Payer: Self-pay

## 2017-05-17 ENCOUNTER — Inpatient Hospital Stay: Payer: PPO | Attending: Oncology | Admitting: Oncology

## 2017-05-17 ENCOUNTER — Encounter: Payer: Self-pay | Admitting: Oncology

## 2017-05-17 VITALS — BP 138/84 | HR 84 | Temp 97.3°F | Resp 16 | Ht 69.0 in | Wt 145.0 lb

## 2017-05-17 DIAGNOSIS — Z9012 Acquired absence of left breast and nipple: Secondary | ICD-10-CM | POA: Insufficient documentation

## 2017-05-17 DIAGNOSIS — Z79899 Other long term (current) drug therapy: Secondary | ICD-10-CM | POA: Diagnosis not present

## 2017-05-17 DIAGNOSIS — Z803 Family history of malignant neoplasm of breast: Secondary | ICD-10-CM | POA: Diagnosis not present

## 2017-05-17 DIAGNOSIS — Z79811 Long term (current) use of aromatase inhibitors: Secondary | ICD-10-CM | POA: Insufficient documentation

## 2017-05-17 DIAGNOSIS — E785 Hyperlipidemia, unspecified: Secondary | ICD-10-CM | POA: Insufficient documentation

## 2017-05-17 DIAGNOSIS — E876 Hypokalemia: Secondary | ICD-10-CM

## 2017-05-17 DIAGNOSIS — D649 Anemia, unspecified: Secondary | ICD-10-CM | POA: Diagnosis not present

## 2017-05-17 DIAGNOSIS — M8580 Other specified disorders of bone density and structure, unspecified site: Secondary | ICD-10-CM | POA: Diagnosis not present

## 2017-05-17 DIAGNOSIS — I10 Essential (primary) hypertension: Secondary | ICD-10-CM | POA: Insufficient documentation

## 2017-05-17 DIAGNOSIS — Z7982 Long term (current) use of aspirin: Secondary | ICD-10-CM

## 2017-05-17 DIAGNOSIS — G4700 Insomnia, unspecified: Secondary | ICD-10-CM | POA: Insufficient documentation

## 2017-05-17 DIAGNOSIS — Z170 Estrogen receptor positive status [ER+]: Secondary | ICD-10-CM | POA: Diagnosis not present

## 2017-05-17 DIAGNOSIS — K219 Gastro-esophageal reflux disease without esophagitis: Secondary | ICD-10-CM | POA: Insufficient documentation

## 2017-05-17 DIAGNOSIS — F419 Anxiety disorder, unspecified: Secondary | ICD-10-CM | POA: Insufficient documentation

## 2017-05-17 DIAGNOSIS — Z9221 Personal history of antineoplastic chemotherapy: Secondary | ICD-10-CM | POA: Diagnosis not present

## 2017-05-17 DIAGNOSIS — G629 Polyneuropathy, unspecified: Secondary | ICD-10-CM

## 2017-05-17 DIAGNOSIS — C50412 Malignant neoplasm of upper-outer quadrant of left female breast: Secondary | ICD-10-CM | POA: Insufficient documentation

## 2017-05-17 DIAGNOSIS — Z95828 Presence of other vascular implants and grafts: Secondary | ICD-10-CM

## 2017-05-17 LAB — CBC WITH DIFFERENTIAL/PLATELET
BASOS ABS: 0 10*3/uL (ref 0–0.1)
BASOS PCT: 1 %
Eosinophils Absolute: 0.1 10*3/uL (ref 0–0.7)
Eosinophils Relative: 3 %
HEMATOCRIT: 30.7 % — AB (ref 35.0–47.0)
Hemoglobin: 10.4 g/dL — ABNORMAL LOW (ref 12.0–16.0)
LYMPHS PCT: 27 %
Lymphs Abs: 1.3 10*3/uL (ref 1.0–3.6)
MCH: 29.7 pg (ref 26.0–34.0)
MCHC: 34 g/dL (ref 32.0–36.0)
MCV: 87.4 fL (ref 80.0–100.0)
Monocytes Absolute: 0.4 10*3/uL (ref 0.2–0.9)
Monocytes Relative: 8 %
NEUTROS ABS: 3 10*3/uL (ref 1.4–6.5)
NEUTROS PCT: 61 %
PLATELETS: 288 10*3/uL (ref 150–440)
RBC: 3.51 MIL/uL — AB (ref 3.80–5.20)
RDW: 15.2 % — AB (ref 11.5–14.5)
WBC: 4.8 10*3/uL (ref 3.6–11.0)

## 2017-05-17 LAB — COMPREHENSIVE METABOLIC PANEL
ALK PHOS: 53 U/L (ref 38–126)
ALT: 22 U/L (ref 14–54)
ANION GAP: 10 (ref 5–15)
AST: 28 U/L (ref 15–41)
Albumin: 3.9 g/dL (ref 3.5–5.0)
BILIRUBIN TOTAL: 0.5 mg/dL (ref 0.3–1.2)
BUN: 16 mg/dL (ref 6–20)
CALCIUM: 9.9 mg/dL (ref 8.9–10.3)
CO2: 26 mmol/L (ref 22–32)
Chloride: 103 mmol/L (ref 101–111)
Creatinine, Ser: 0.99 mg/dL (ref 0.44–1.00)
GFR, EST NON AFRICAN AMERICAN: 52 mL/min — AB (ref >60)
Glucose, Bld: 111 mg/dL — ABNORMAL HIGH (ref 65–99)
POTASSIUM: 3.4 mmol/L — AB (ref 3.5–5.1)
Sodium: 139 mmol/L (ref 135–145)
TOTAL PROTEIN: 7 g/dL (ref 6.5–8.1)

## 2017-05-17 MED ORDER — HEPARIN SOD (PORK) LOCK FLUSH 100 UNIT/ML IV SOLN
500.0000 [IU] | Freq: Once | INTRAVENOUS | Status: AC
  Administered 2017-05-17: 500 [IU] via INTRAVENOUS

## 2017-05-17 MED ORDER — SODIUM CHLORIDE 0.9% FLUSH
10.0000 mL | INTRAVENOUS | Status: DC | PRN
  Administered 2017-05-17: 10 mL via INTRAVENOUS
  Filled 2017-05-17: qty 10

## 2017-05-17 NOTE — Progress Notes (Signed)
Patient here for follow up. She reports that she is having trouble sleeping do to leg pain and tingling. Her PCP prescribed Gabapentin she has not started taking it yet.

## 2017-05-22 ENCOUNTER — Other Ambulatory Visit: Payer: Self-pay | Admitting: Nurse Practitioner

## 2017-05-22 ENCOUNTER — Other Ambulatory Visit: Payer: Self-pay

## 2017-05-22 DIAGNOSIS — Z1231 Encounter for screening mammogram for malignant neoplasm of breast: Secondary | ICD-10-CM

## 2017-05-25 ENCOUNTER — Other Ambulatory Visit: Payer: Self-pay

## 2017-05-25 DIAGNOSIS — R921 Mammographic calcification found on diagnostic imaging of breast: Secondary | ICD-10-CM

## 2017-05-31 ENCOUNTER — Ambulatory Visit (INDEPENDENT_AMBULATORY_CARE_PROVIDER_SITE_OTHER): Payer: PPO | Admitting: Family Medicine

## 2017-05-31 VITALS — BP 128/70 | HR 96 | Temp 97.9°F | Resp 18

## 2017-05-31 DIAGNOSIS — G629 Polyneuropathy, unspecified: Secondary | ICD-10-CM

## 2017-05-31 DIAGNOSIS — E7849 Other hyperlipidemia: Secondary | ICD-10-CM | POA: Diagnosis not present

## 2017-05-31 DIAGNOSIS — I10 Essential (primary) hypertension: Secondary | ICD-10-CM | POA: Diagnosis not present

## 2017-05-31 DIAGNOSIS — I6523 Occlusion and stenosis of bilateral carotid arteries: Secondary | ICD-10-CM

## 2017-05-31 DIAGNOSIS — D649 Anemia, unspecified: Secondary | ICD-10-CM

## 2017-05-31 DIAGNOSIS — C50412 Malignant neoplasm of upper-outer quadrant of left female breast: Secondary | ICD-10-CM

## 2017-05-31 DIAGNOSIS — F419 Anxiety disorder, unspecified: Secondary | ICD-10-CM

## 2017-05-31 MED ORDER — GABAPENTIN 400 MG PO CAPS: 400.0000 mg | ORAL_CAPSULE | Freq: Every day | ORAL | 3 refills | Status: AC

## 2017-05-31 NOTE — Progress Notes (Signed)
Patient: Sandra Fritz Female    DOB: 1934-07-25   83 y.o.   MRN: [id number] Visit Date: 05/31/2017  Today's Provider: Wilhemena Durie, MD   Chief Complaint  Patient presents with  . Follow-up   Subjective:    HPI Pt is here for a 1 month follow up. She was told to stop her Metolazone. She stopped it and has not had any problems with swelling. She reports that her biggest problem is her feet at night. She has tingling in her feet and when she lays down at night. She has to get up and walk around at night. She does not notice the sensation during the day only at night.  She is taking gabapentin daily.     Allergies  Allergen Reactions  . Erythromycin Rash and Other (See Comments)    ALL MYCINS  . Keflex [Cephalexin] Hives    Patient does not remember  . Statins Other (See Comments)    Muscle and joint pain-patient states she tried all statins.     Current Outpatient Medications:  .  aspirin 81 MG tablet, Take 81 mg by mouth daily. , Disp: , Rfl:  .  esomeprazole (NEXIUM) 20 MG capsule, Take 1 capsule (20 mg total) by mouth daily., Disp: 90 capsule, Rfl: 3 .  furosemide (LASIX) 20 MG tablet, Take 20 mg by mouth 2 (two) times daily., Disp: , Rfl:  .  gabapentin (NEURONTIN) 100 MG capsule, Take 2 capsules by mouth 2 (two) times daily., Disp: , Rfl:  .  KLOR-CON M20 20 MEQ tablet, TAKE 1 TABLET BY MOUTH TWICE A DAY, Disp: 60 tablet, Rfl: 0 .  letrozole (FEMARA) 2.5 MG tablet, Take 1 tablet (2.5 mg total) by mouth daily., Disp: 30 tablet, Rfl: 3 .  losartan (COZAAR) 100 MG tablet, Take 1 tablet (100 mg total) by mouth daily., Disp: 90 tablet, Rfl: 3 .  mirtazapine (REMERON) 7.5 MG tablet, , Disp: , Rfl:  .  Misc Natural Products (OSTEO BI-FLEX JOINT SHIELD PO), Take 2 tablets by mouth daily., Disp: , Rfl:  .  Multiple Vitamin (MULTIVITAMIN WITH MINERALS) TABS tablet, Take 1 tablet by mouth daily. , Disp: , Rfl:  .  rosuvastatin (CRESTOR) 5 MG tablet, TAKE 1 TABLET (5 MG  TOTAL) BY MOUTH EVERY OTHER DAY., Disp: 45 tablet, Rfl: 3 .  sucralfate (CARAFATE) 1 g tablet, TAKE 1 TABLET (1 G TOTAL) BY MOUTH 4 (FOUR) TIMES DAILY - WITH MEALS AND AT BEDTIME., Disp: 120 tablet, Rfl: 4 .  lidocaine-prilocaine (EMLA) cream, Apply 1 application topically as needed. Apply to port 1 hour prior to chemotherapy appointment. Cover with plastic wrap. (Patient not taking: Reported on 05/31/2017), Disp: 30 g, Rfl: 2 .  metolazone (ZAROXOLYN) 2.5 MG tablet, , Disp: , Rfl:  .  prochlorperazine (COMPAZINE) 10 MG tablet, Take 10 mg by mouth every 6 (six) hours as needed for nausea or vomiting., Disp: , Rfl:   Review of Systems  Constitutional: Negative.   HENT: Negative.   Eyes: Negative.   Respiratory: Negative.   Cardiovascular: Negative.   Gastrointestinal: Negative.   Endocrine: Negative.   Genitourinary: Negative.   Musculoskeletal: Negative.   Skin: Negative.   Allergic/Immunologic: Negative.   Neurological: Negative.   Hematological: Negative.   Psychiatric/Behavioral: Negative.     Social History   Tobacco Use  . Smoking status: Never Smoker  . Smokeless tobacco: Never Used  Substance Use Topics  . Alcohol use: Yes  Alcohol/week: 0.6 oz    Types: 1 Glasses of wine per week    Comment: Maybe 3 per year   Objective:   BP 128/70 (BP Location: Left Arm, Patient Position: Sitting, Cuff Size: Normal)   Pulse 96   Temp 97.9 F (36.6 C) (Oral)   Resp 18   SpO2 99%  Vitals:   05/31/17 1337  BP: 128/70  Pulse: 96  Resp: 18  Temp: 97.9 F (36.6 C)  TempSrc: Oral  SpO2: 99%     Physical Exam  Constitutional: She appears well-developed and well-nourished.  HENT:  Head: Normocephalic and atraumatic.  Eyes: Conjunctivae are normal.  Neck: No thyromegaly present.  Cardiovascular: Normal rate, regular rhythm and normal heart sounds.  Pulmonary/Chest: Effort normal and breath sounds normal.  Abdominal: Soft.  Lymphadenopathy:    She has no cervical  adenopathy.  Neurological: She is alert.  Skin: Skin is warm and dry.  Psychiatric: She has a normal mood and affect. Her behavior is normal. Judgment and thought content normal.        Assessment & Plan:     1. Essential hypertension   2. Neuropathy  - gabapentin (NEURONTIN) 400 MG capsule; Take 1 capsule (400 mg total) by mouth at bedtime.  Dispense: 90 capsule; Refill: 3 3.Breast Cancer 4.CKD 5.Anemia  I have done the exam and reviewed the chart and it is accurate to the best of my knowledge. Development worker, community has been used and  any errors in dictation or transcription are unintentional. Miguel Aschoff M.D. [place], MD  [facility]

## 2017-06-21 ENCOUNTER — Other Ambulatory Visit: Payer: Self-pay | Admitting: Nurse Practitioner

## 2017-06-26 ENCOUNTER — Other Ambulatory Visit: Payer: Self-pay | Admitting: Oncology

## 2017-06-27 ENCOUNTER — Ambulatory Visit (INDEPENDENT_AMBULATORY_CARE_PROVIDER_SITE_OTHER): Payer: PPO | Admitting: Family Medicine

## 2017-06-27 ENCOUNTER — Ambulatory Visit (INDEPENDENT_AMBULATORY_CARE_PROVIDER_SITE_OTHER): Payer: PPO

## 2017-06-27 VITALS — BP 136/60 | HR 94 | Temp 98.6°F | Ht 69.0 in | Wt 154.6 lb

## 2017-06-27 DIAGNOSIS — I10 Essential (primary) hypertension: Secondary | ICD-10-CM

## 2017-06-27 DIAGNOSIS — E7849 Other hyperlipidemia: Secondary | ICD-10-CM

## 2017-06-27 DIAGNOSIS — Z0000 Encounter for general adult medical examination without abnormal findings: Secondary | ICD-10-CM

## 2017-06-27 NOTE — Progress Notes (Signed)
Subjective:   Sandra Fritz is a 83 y.o. female who presents for Medicare Annual (Subsequent) preventive examination.  Review of Systems:  N/A Cardiac Risk Factors include: advanced age (>20men, >26 women);dyslipidemia;hypertension     Objective:     Vitals: BP 136/60 (BP Location: Right Arm)   Pulse 94   Temp 98.6 F (37 C) (Oral)   Ht 5\' 9"  (1.753 m)   Wt 154 lb 9.6 oz (70.1 kg)   BMI 22.83 kg/m   Body mass index is 22.83 kg/m.  Advanced Directives 06/27/2017 05/17/2017 02/14/2017 01/10/2017 12/30/2016 12/27/2016 12/23/2016  Does Patient Have a Medical Advance Directive? Yes Yes Yes Yes Yes Yes No  Type of Paramedic of [place];Living will [place];Living will Healthcare Power of Attorney Living will;Healthcare Power of [place];Living will [place];Living will -  Does patient want to make changes to medical advance directive? - - - - No - Patient declined - -  Copy of [place] in Chart? No - copy requested No - copy requested - - - - -  Would patient like information on creating a medical advance directive? - No - Patient declined - - No - Patient declined - Yes (MAU/Ambulatory/Procedural Areas - Information given)    Tobacco Social History   Tobacco Use  Smoking Status Never Smoker  Smokeless Tobacco Never Used     Counseling given: Not Answered   Clinical Intake:  Pre-visit preparation completed: Yes  Pain : No/denies pain Pain Score: 0-No pain     Nutritional Status: BMI of 19-24  Normal Nutritional Risks: None Diabetes: No  How often do you need to have someone help you when you read instructions, pamphlets, or other written materials from your doctor or pharmacy?: 1 - Never  Interpreter Needed?: No  Information entered by :: [facility], LPN  Past Medical History:  Diagnosis Date  . Anemia   . Anxiety   . Arthritis   . Blood  transfusion without reported diagnosis   . Breast cancer ([place])   . Cataract   . Complication of anesthesia    HARD TO WAKE UP AFTER TONSILLECTOMY  . GERD (gastroesophageal reflux disease)   . Hyperlipidemia   . Hypertension   . Neuromuscular disorder ([place])   . Personal history of chemotherapy   . Primary cancer of upper outer quadrant of left female breast ([place]) 07/28/2016   2.7 cm, T2, ER/PR +, her 2 neu negative prior to neoadjuvant chemotherapy   Past Surgical History:  Procedure Laterality Date  . APPENDECTOMY  1955   SECONDARY TO RUTURED APPENDIX  . CATARACT EXTRACTION, BILATERAL  2011  . COLONOSCOPY  2002  . DILATION AND CURETTAGE OF UTERUS  1989   Dr. Laurey Morale  . JOINT REPLACEMENT  2012   Right hip  . MASTECTOMY W/ SENTINEL NODE BIOPSY Left 12/30/2016   Procedure: LEFT MASTECTOMY WITH SENTINEL LYMPH NODE BIOPSY;  Surgeon: Robert Bellow, MD;  Location: [facility] ORS;  Service: General;  Laterality: Left;  . MOLE REMOVAL  1957   18 removed by Dr. Hoyle Sauer  . NECK SURGERY  2008   metal in neck  . PORTACATH PLACEMENT Right 08/05/2016   Procedure: INSERTION PORT-A-CATH WITH ULTRASOUND;  Surgeon: Robert Bellow, MD;  Location: [facility] ORS;  Service: General;  Laterality: Right;  . TONSILLECTOMY  1955   Family History  Problem Relation Age of Onset  . Macular degeneration Mother   .  Hypertension Mother   . Diabetes Father   . Heart disease Father   . Heart attack Father 72  . Heart disease Brother 80  . Dermatomyositis Sister   . Cancer Sister        Carcinoma of the uterine or ovarian all attached to colon and is in remission now  . Breast cancer Maternal Aunt 70  . Breast cancer Paternal Aunt 54  . Breast cancer Cousin        paternal aunt w/ breast ca daughter  . Breast cancer Maternal Aunt 60   Social History   Socioeconomic History  . Marital status: Widowed    Spouse name: Not on file  . Number of children: 0  . Years of education: Not on file  .  Highest education level: Associate degree: occupational, Hotel manager, or vocational program  Occupational History  . Occupation: retired  Scientific laboratory technician  . Financial resource strain: Not hard at all  . Food insecurity:    Worry: Never true    Inability: Never true  . Transportation needs:    Medical: No    Non-medical: No  Tobacco Use  . Smoking status: Never Smoker  . Smokeless tobacco: Never Used  Substance and Sexual Activity  . Alcohol use: Yes    Alcohol/week: 0.0 oz    Comment: Maybe 3 per year  . Drug use: No  . Sexual activity: Never  Lifestyle  . Physical activity:    Days per week: Not on file    Minutes per session: Not on file  . Stress: Not at all  Relationships  . Social connections:    Talks on phone: Not on file    Gets together: Not on file    Attends religious service: Not on file    Active member of club or organization: Not on file    Attends meetings of clubs or organizations: Not on file    Relationship status: Not on file  Other Topics Concern  . Not on file  Social History Narrative  . Not on file    Outpatient Encounter Medications as of 06/27/2017  Medication Sig  . aspirin 81 MG tablet Take 81 mg by mouth daily.   Marland Kitchen esomeprazole (NEXIUM) 20 MG capsule Take 1 capsule (20 mg total) by mouth daily.  . furosemide (LASIX) 20 MG tablet Take 20 mg by mouth 2 (two) times daily.  Marland Kitchen gabapentin (NEURONTIN) 400 MG capsule Take 1 capsule (400 mg total) by mouth at bedtime.  Marland Kitchen KLOR-CON M20 20 MEQ tablet TAKE 1 TABLET BY MOUTH TWICE A DAY  . letrozole (FEMARA) 2.5 MG tablet TAKE 1 TABLET BY MOUTH EVERY DAY  . losartan (COZAAR) 100 MG tablet Take 1 tablet (100 mg total) by mouth daily.  . mirtazapine (REMERON) 7.5 MG tablet Take 15 mg by mouth at bedtime.   . Misc Natural Products (OSTEO BI-FLEX JOINT SHIELD PO) Take 2 tablets by mouth daily.  . Multiple Vitamin (MULTIVITAMIN WITH MINERALS) TABS tablet Take 1 tablet by mouth daily.   . rosuvastatin (CRESTOR) 5  MG tablet TAKE 1 TABLET (5 MG TOTAL) BY MOUTH EVERY OTHER DAY.  . sucralfate (CARAFATE) 1 g tablet TAKE 1 TABLET (1 G TOTAL) BY MOUTH 4 (FOUR) TIMES DAILY - WITH MEALS AND AT BEDTIME.  Marland Kitchen lidocaine-prilocaine (EMLA) cream Apply 1 application topically as needed. Apply to port 1 hour prior to chemotherapy appointment. Cover with plastic wrap. (Patient not taking: Reported on 05/31/2017)  . metolazone (ZAROXOLYN) 2.5 MG  tablet   . prochlorperazine (COMPAZINE) 10 MG tablet Take 10 mg by mouth every 6 (six) hours as needed for nausea or vomiting.   No facility-administered encounter medications on file as of 06/27/2017.     Activities of Daily Living In your present state of health, do you have any difficulty performing the following activities: 06/27/2017 12/23/2016  Hearing? N -  Vision? Y -  Comment Occasionally, due to discharge in eyes- allergies? -  Difficulty concentrating or making decisions? N -  Walking or climbing stairs? N Y  Dressing or bathing? N -  Doing errands, shopping? N N  Preparing Food and eating ? N -  Using the Toilet? N -  In the past six months, have you accidently leaked urine? N -  Do you have problems with loss of bowel control? N -  Managing your Medications? N -  Managing your Finances? N -  Housekeeping or managing your Housekeeping? N -  Some recent data might be hidden    Patient Care Team: Jerrol Banana., MD as PCP - General (Unknown Physician Specialty) Bary Castilla, Forest Gleason, MD (General Surgery) Lloyd Huger, MD as Consulting Physician (Oncology) Rico Junker, RN as Oncology Nurse Navigator Vladimir Crofts, MD as Consulting Physician (Neurology)    Assessment:   This is a routine wellness examination for [place]ag[place].  Exercise Activities and Dietary recommendations Current Exercise Habits: Home exercise routine, Type of exercise: stretching;Other - see comments(balence exercises), Time (Minutes): 10, Frequency (Times/Week): 7, Weekly Exercise  (Minutes/Week): 70, Intensity: Mild  Goals    . Prevent falls     Discussed preventative measure to be aware of inside and outside to home to prevent falls.        Fall Risk Fall Risk  06/27/2017 05/03/2017 11/22/2016 11/15/2016 11/08/2016  Falls in the past year? Yes No Yes Yes Yes  Number falls in past yr: 2 or more - 1 1 -  Injury with Fall? Yes - - Yes -  Comment broken bone - - - -  Risk for fall due to : - - - - -  Follow up - - - Education provided -   Is the patient's home free of loose throw rugs in walkways, pet beds, electrical cords, etc?   yes      Grab bars in the bathroom? no      Handrails on the stairs?   yes      Adequate lighting?   yes  Timed Get Up and Go performed: N/A  Depression Screen PHQ 2/9 Scores 06/27/2017 05/03/2017 04/12/2016 12/23/2014  PHQ - 2 Score 0 2 1 0  PHQ- 9 Score - 9 - -     Cognitive Function: Pt declined screening today.      6CIT Screen 04/12/2016  What Year? 0 points  What month? 0 points  What time? 0 points  Count back from 20 0 points  Months in reverse 0 points  Repeat phrase 4 points  Total Score 4    Immunization History  Administered Date(s) Administered  . Influenza, High Dose Seasonal PF 12/23/2014, 01/14/2016  . Pneumococcal Conjugate-13 02/25/2014  . Pneumococcal Polysaccharide-23 04/12/2016  . Td 06/17/2004  . Tdap 08/06/2016  . Zoster 04/14/2011    Qualifies for Shingles Vaccine? Due for Shingles vaccine. Declined my offer to administer today. Education has been provided regarding the importance of this vaccine. Pt has been advised to call her insurance company to determine her out of pocket expense. Advised  she may also receive this vaccine at her local pharmacy or Health Dept. Verbalized acceptance and understanding.  Screening Tests Health Maintenance  Topic Date Due  . INFLUENZA VACCINE  10/19/2017  . TETANUS/TDAP  08/07/2026  . DEXA SCAN  Completed  . PNA vac Low Risk Adult  Completed    Cancer  Screenings: Lung: Low Dose CT Chest recommended if Age 69-80 years, 30 pack-year currently smoking OR have quit w/in 15years. Patient does not qualify. Breast:  Up to date on Mammogram? Yes   Up to date of Bone Density/Dexa? Yes Colorectal: N/A  Additional Screenings:  Hepatitis C Screening: N/A     Plan:  I have personally reviewed and addressed the Medicare Annual Wellness questionnaire and have noted the following in the patient's chart:  A. Medical and social history B. Use of alcohol, tobacco or illicit drugs  C. Current medications and supplements D. Functional ability and status E.  Nutritional status F.  Physical activity G. Advance directives H. List of other physicians I.  Hospitalizations, surgeries, and ER visits in previous 12 months J.  [place] such as hearing and vision if needed, cognitive and depression L. Referrals and appointments - none  In addition, I have reviewed and discussed with patient certain preventive protocols, quality metrics, and best practice recommendations. A written personalized care plan for preventive services as well as general preventive health recommendations were provided to patient.  See attached scanned questionnaire for additional information.   Signed,  Fabio Neighbors, LPN Nurse Health Advisor   Nurse Recommendations: None.

## 2017-06-27 NOTE — Patient Instructions (Addendum)
Sandra Fritz , Thank you for taking time to come for your Medicare Wellness Visit. I appreciate your ongoing commitment to your health goals. Please review the following plan we discussed and let me know if I can assist you in the future.   Screening recommendations/referrals: Colonoscopy: N/A Mammogram: Up to date Bone Density: Up to date Recommended yearly ophthalmology/optometry visit for glaucoma screening and checkup Recommended yearly dental visit for hygiene and checkup  Vaccinations: Influenza vaccine: N/A Pneumococcal vaccine: Up to date Tdap vaccine: Up to date Shingles vaccine: Pt declines today.     Advanced directives: Please bring a copy of your POA (Power of Attorney) and/or Living Will to your next appointment.   Conditions/risks identified: Discussed preventative measure to be aware of inside and outside to home to prevent falls.   Next appointment: 2:00 PM today with Dr Rosanna Randy.    Preventive Care 81 Years and Older, Female Preventive care refers to lifestyle choices and visits with your health care provider that can promote health and wellness. What does preventive care include?  A yearly physical exam. This is also called an annual well check.  Dental exams once or twice a year.  Routine eye exams. Ask your health care provider how often you should have your eyes checked.  Personal lifestyle choices, including:  Daily care of your teeth and gums.  Regular physical activity.  Eating a healthy diet.  Avoiding tobacco and drug use.  Limiting alcohol use.  Practicing safe sex.  Taking low-dose aspirin every day.  Taking vitamin and mineral supplements as recommended by your health care provider. What happens during an annual well check? The services and screenings done by your health care provider during your annual well check will depend on your age, overall health, lifestyle risk factors, and family history of disease. Counseling  Your health  care provider may ask you questions about your:  Alcohol use.  Tobacco use.  Drug use.  Emotional well-being.  Home and relationship well-being.  Sexual activity.  Eating habits.  History of falls.  Memory and ability to understand (cognition).  Work and work Statistician.  Reproductive health. Screening  You may have the following tests or measurements:  Height, weight, and BMI.  Blood pressure.  Lipid and cholesterol levels. These may be checked every 5 years, or more frequently if you are over 36 years old.  Skin check.  Lung cancer screening. You may have this screening every year starting at age 81 if you have a 30-pack-year history of smoking and currently smoke or have quit within the past 15 years.  Fecal occult blood test (FOBT) of the stool. You may have this test every year starting at age 81.  Flexible sigmoidoscopy or colonoscopy. You may have a sigmoidoscopy every 5 years or a colonoscopy every 10 years starting at age 56.  Hepatitis C blood test.  Hepatitis B blood test.  Sexually transmitted disease (STD) testing.  Diabetes screening. This is done by checking your blood sugar (glucose) after you have not eaten for a while (fasting). You may have this done every 1-3 years.  Bone density scan. This is done to screen for osteoporosis. You may have this done starting at age 47.  Mammogram. This may be done every 1-2 years. Talk to your health care provider about how often you should have regular mammograms. Talk with your health care provider about your test results, treatment options, and if necessary, the need for more tests. Vaccines  Your health care provider  may recommend certain vaccines, such as:  Influenza vaccine. This is recommended every year.  Tetanus, diphtheria, and acellular pertussis (Tdap, Td) vaccine. You may need a Td booster every 10 years.  Zoster vaccine. You may need this after age 79.  Pneumococcal 13-valent conjugate  (PCV13) vaccine. One dose is recommended after age 81.  Pneumococcal polysaccharide (PPSV23) vaccine. One dose is recommended after age 81. Talk to your health care provider about which screenings and vaccines you need and how often you need them. This information is not intended to replace advice given to you by your health care provider. Make sure you discuss any questions you have with your health care provider. Document Released: 04/03/2015 Document Revised: 11/25/2015 Document Reviewed: 01/06/2015 Elsevier Interactive Patient Education  2017 [place] Prevention in the Home Falls can cause injuries. They can happen to people of all ages. There are many things you can do to make your home safe and to help prevent falls. What can I do on the outside of my home?  Regularly fix the edges of walkways and driveways and fix any cracks.  Remove anything that might make you trip as you walk through a door, such as a raised step or threshold.  Trim any bushes or trees on the path to your home.  Use bright outdoor lighting.  Clear any walking paths of anything that might make someone trip, such as rocks or tools.  Regularly check to see if handrails are loose or broken. Make sure that both sides of any steps have handrails.  Any raised decks and porches should have guardrails on the edges.  Have any leaves, snow, or ice cleared regularly.  Use sand or salt on walking paths during winter.  Clean up any spills in your garage right away. This includes oil or grease spills. What can I do in the bathroom?  Use night lights.  Install grab bars by the toilet and in the tub and shower. Do not use towel bars as grab bars.  Use non-skid mats or decals in the tub or shower.  If you need to sit down in the shower, use a plastic, non-slip stool.  Keep the floor dry. Clean up any water that spills on the floor as soon as it happens.  Remove soap buildup in the tub or shower  regularly.  Attach bath mats securely with double-sided non-slip rug tape.  Do not have throw rugs and other things on the floor that can make you trip. What can I do in the bedroom?  Use night lights.  Make sure that you have a light by your bed that is easy to reach.  Do not use any sheets or blankets that are too big for your bed. They should not hang down onto the floor.  Have a firm chair that has side arms. You can use this for support while you get dressed.  Do not have throw rugs and other things on the floor that can make you trip. What can I do in the kitchen?  Clean up any spills right away.  Avoid walking on wet floors.  Keep items that you use a lot in easy-to-reach places.  If you need to reach something above you, use a strong step stool that has a grab bar.  Keep electrical cords out of the way.  Do not use floor polish or wax that makes floors slippery. If you must use wax, use non-skid floor wax.  Do not have throw rugs  and other things on the floor that can make you trip. What can I do with my stairs?  Do not leave any items on the stairs.  Make sure that there are handrails on both sides of the stairs and use them. Fix handrails that are broken or loose. Make sure that handrails are as long as the stairways.  Check any carpeting to make sure that it is firmly attached to the stairs. Fix any carpet that is loose or worn.  Avoid having throw rugs at the top or bottom of the stairs. If you do have throw rugs, attach them to the floor with carpet tape.  Make sure that you have a light switch at the top of the stairs and the bottom of the stairs. If you do not have them, ask someone to add them for you. What else can I do to help prevent falls?  Wear shoes that:  Do not have high heels.  Have rubber bottoms.  Are comfortable and fit you well.  Are closed at the toe. Do not wear sandals.  If you use a stepladder:  Make sure that it is fully  opened. Do not climb a closed stepladder.  Make sure that both sides of the stepladder are locked into place.  Ask someone to hold it for you, if possible.  Clearly mark and make sure that you can see:  Any grab bars or handrails.  First and last steps.  Where the edge of each step is.  Use tools that help you move around (mobility aids) if they are needed. These include:  Canes.  Walkers.  Scooters.  Crutches.  Turn on the lights when you go into a dark area. Replace any light bulbs as soon as they burn out.  Set up your furniture so you have a clear path. Avoid moving your furniture around.  If any of your floors are uneven, fix them.  If there are any pets around you, be aware of where they are.  Review your medicines with your doctor. Some medicines can make you feel dizzy. This can increase your chance of falling. Ask your doctor what other things that you can do to help prevent falls. This information is not intended to replace advice given to you by your health care provider. Make sure you discuss any questions you have with your health care provider. Document Released: 01/01/2009 Document Revised: 08/13/2015 Document Reviewed: 04/11/2014 Elsevier Interactive Patient Education  2017 [employer].

## 2017-06-27 NOTE — Progress Notes (Signed)
Patient: Sandra Fritz, Female    DOB: [birth year], 83 y.o.   MRN: [id number] Visit Date: 06/27/2017  Today's Provider: Wilhemena Durie, MD   No chief complaint on file.  Subjective:  Sandra Fritz is a 83 y.o. female who presents today for health maintenance and complete physical. She feels well. She reports exercising daily. She reports she is sleeping well.  Immunization History  Administered Date(s) Administered  . Influenza, High Dose Seasonal PF 12/23/2014, 01/14/2016  . Pneumococcal Conjugate-13 02/25/2014  . Pneumococcal Polysaccharide-23 04/12/2016  . Td 06/17/2004  . Tdap 08/06/2016  . Zoster 04/14/2011   12/22/16 Mammogram-Cat 4 for Right side.  Followed by Dr Bary Castilla 03/28/17 BMD-The probability of a major osteoporotic fracture is 18.6% within the next ten years. The probability of a hip fracture is 4.7% within the next ten years.   Review of Systems  Constitutional: Positive for unexpected weight change.  HENT: Negative.   Eyes: Positive for discharge and visual disturbance.  Respiratory: Negative.   Cardiovascular: Positive for leg swelling.  Gastrointestinal: Negative.   Endocrine: Negative.   Genitourinary: Negative.   Musculoskeletal: Positive for neck pain.  Skin: Negative.   Allergic/Immunologic: Negative.   Neurological: Negative.   Hematological: Negative.   Psychiatric/Behavioral: Negative.     Social History   Socioeconomic History  . Marital status: Widowed    Spouse name: Not on file  . Number of children: 0  . Years of education: Not on file  . Highest education level: Associate degree: occupational, Hotel manager, or vocational program  Occupational History  . Occupation: retired  Scientific laboratory technician  . Financial resource strain: Not hard at all  . Food insecurity:    Worry: Never true    Inability: Never true  . Transportation needs:    Medical: No    Non-medical: No  Tobacco Use  . Smoking status: Never Smoker  . Smokeless tobacco:  Never Used  Substance and Sexual Activity  . Alcohol use: Yes    Alcohol/week: 0.0 oz    Comment: Maybe 3 per year  . Drug use: No  . Sexual activity: Never  Lifestyle  . Physical activity:    Days per week: Not on file    Minutes per session: Not on file  . Stress: Not at all  Relationships  . Social connections:    Talks on phone: Not on file    Gets together: Not on file    Attends religious service: Not on file    Active member of club or organization: Not on file    Attends meetings of clubs or organizations: Not on file    Relationship status: Not on file  . Intimate partner violence:    Fear of current or ex partner: Not on file    Emotionally abused: Not on file    Physically abused: Not on file    Forced sexual activity: Not on file  Other Topics Concern  . Not on file  Social History Narrative  . Not on file    Patient Active Problem List   Diagnosis Date Noted  . Loss of weight 12/06/2016  . Bilateral carotid artery stenosis 10/18/2016  . Primary cancer of upper outer quadrant of left female breast ([place]) 07/28/2016  . Left breast mass 07/25/2016  . Abnormal finding on mammography 07/25/2016  . Hyperglycemia 04/12/2016  . Absolute anemia 07/25/2014  . Anxiety 07/25/2014  . AB (asthmatic bronchitis) 07/25/2014  . 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 Intervertebral cervical disc  disorder with myelopathy, cervical region 07/25/2014  . Chest pain 07/25/2014  . Breath shortness 07/25/2014  . Edema leg 07/25/2014  . Fatigue 07/25/2014  . Acid reflux 07/25/2014  . 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 Arthralgia of hip 07/25/2014  . HLD (hyperlipidemia) 07/25/2014  . Below normal amount of sodium in the blood 07/25/2014  . Allergic state 07/25/2014  . Neuropathy 07/25/2014  . Arthritis, degenerative 07/25/2014  . Peptic ulcer 07/25/2014  . FOOT, PAIN 10/12/2007  . Hyperlipidemia 09/18/2007  . Hypertension 09/18/2007  . PEPTIC ULCER DISEASE  09/18/2007  . PLANTAR FASCIITIS, LEFT 09/18/2007    Past Surgical History:  Procedure Laterality Date  . APPENDECTOMY  1955   SECONDARY TO RUTURED APPENDIX  . CATARACT EXTRACTION, BILATERAL  2011  . COLONOSCOPY  2002  . DILATION AND CURETTAGE OF UTERUS  1989   Dr. Laurey Morale  . JOINT REPLACEMENT  2012   Right hip  . MASTECTOMY W/ SENTINEL NODE BIOPSY Left 12/30/2016   Procedure: LEFT MASTECTOMY WITH SENTINEL LYMPH NODE BIOPSY;  Surgeon: Robert Bellow, MD;  Location: [facility] ORS;  Service: General;  Laterality: Left;  . MOLE REMOVAL  1957   18 removed by Dr. Hoyle Sauer  . NECK SURGERY  2008   metal in neck  . PORTACATH PLACEMENT Right 08/05/2016   Procedure: INSERTION PORT-A-CATH WITH ULTRASOUND;  Surgeon: Robert Bellow, MD;  Location: [facility] ORS;  Service: General;  Laterality: Right;  . TONSILLECTOMY  1955    Her family history includes Breast cancer in her cousin; Breast cancer (age of onset: 32) in her maternal aunt; Breast cancer (age of onset: 82) in her maternal aunt; Breast cancer (age of onset: 71) in her paternal aunt; Cancer in her sister; Dermatomyositis in her sister; Diabetes in her father; Heart attack (age of onset: 31) in her father; Heart disease in her father; Heart disease (age of onset: 83) in her brother; Hypertension in her mother; Macular degeneration in her mother.     Outpatient Encounter Medications as of 06/27/2017  Medication Sig  . aspirin 81 MG tablet Take 81 mg by mouth daily.   Marland Kitchen esomeprazole (NEXIUM) 20 MG capsule Take 1 capsule (20 mg total) by mouth daily.  . furosemide (LASIX) 20 MG tablet Take 20 mg by mouth 2 (two) times daily.  Marland Kitchen gabapentin (NEURONTIN) 400 MG capsule Take 1 capsule (400 mg total) by mouth at bedtime.  Marland Kitchen KLOR-CON M20 20 MEQ tablet TAKE 1 TABLET BY MOUTH TWICE A DAY  . letrozole (FEMARA) 2.5 MG tablet TAKE 1 TABLET BY MOUTH EVERY DAY  . lidocaine-prilocaine (EMLA) cream Apply 1 application topically as needed. Apply to port 1  hour prior to chemotherapy appointment. Cover with plastic wrap. (Patient not taking: Reported on 05/31/2017)  . losartan (COZAAR) 100 MG tablet Take 1 tablet (100 mg total) by mouth daily.  . metolazone (ZAROXOLYN) 2.5 MG tablet   . mirtazapine (REMERON) 7.5 MG tablet Take 15 mg by mouth at bedtime.   . Misc Natural Products (OSTEO BI-FLEX JOINT SHIELD PO) Take 2 tablets by mouth daily.  . Multiple Vitamin (MULTIVITAMIN WITH MINERALS) TABS tablet Take 1 tablet by mouth daily.   . prochlorperazine (COMPAZINE) 10 MG tablet Take 10 mg by mouth every 6 (six) hours as needed for nausea or vomiting.  . rosuvastatin (CRESTOR) 5 MG tablet TAKE 1 TABLET (5 MG TOTAL) BY MOUTH EVERY OTHER DAY.  . sucralfate (CARAFATE) 1 g tablet TAKE 1 TABLET (1 G TOTAL) BY MOUTH 4 (FOUR)  TIMES DAILY - WITH MEALS AND AT BEDTIME.   No facility-administered encounter medications on file as of 06/27/2017.     Patient Care Team: Jerrol Banana., MD as PCP - General (Unknown Physician Specialty) Jerrol Banana., MD (Family Medicine) Byrnett, Forest Gleason, MD (General Surgery) Lloyd Huger, MD as Consulting Physician (Oncology) Rico Junker, RN as Oncology Nurse Navigator      Objective:   Vitals: There were no vitals filed for this visit.  Physical Exam  Constitutional: She is oriented to person, place, and time. She appears well-developed and well-nourished.  HENT:  Head: Normocephalic and atraumatic.  Right Ear: External ear normal.  Left Ear: External ear normal.  Nose: Nose normal.  Mouth/Throat: Oropharynx is clear and moist.  Upper dentures.  Eyes: Pupils are equal, round, and reactive to light. Conjunctivae and EOM are normal.  Neck: Normal range of motion. Neck supple.  Cardiovascular: Normal rate, regular rhythm, normal heart sounds and intact distal pulses.  Pulmonary/Chest: Effort normal and breath sounds normal.  Abdominal: Soft. Bowel sounds are normal.  Genitourinary:   Genitourinary Comments: S/p left mastectomy. Right breast normal.,  Musculoskeletal: Normal range of motion.  Neurological: She is alert and oriented to person, place, and time.  Skin: Skin is warm and dry.  Psychiatric: She has a normal mood and affect. Her behavior is normal. Judgment and thought content normal.     Assessment & Plan:     Routine Health Maintenance and Physical Exam  Exercise Activities and Dietary recommendations Goals    None      Immunization History  Administered Date(s) Administered  . Influenza, High Dose Seasonal PF 12/23/2014, 01/14/2016  . Pneumococcal Conjugate-13 02/25/2014  . Pneumococcal Polysaccharide-23 04/12/2016  . Td 06/17/2004  . Tdap 08/06/2016  . Zoster 04/14/2011    Health Maintenance  Topic Date Due  . INFLUENZA VACCINE  10/19/2017  . TETANUS/TDAP  08/07/2026  . DEXA SCAN  Completed  . PNA vac Low Risk Adult  Completed     Discussed health benefits of physical activity, and encouraged her to engage in regular exercise appropriate for her age and condition.  RTC 4 months.   I have done the exam and reviewed the chart and it is accurate to the best of my knowledge. Development worker, community has been used and  any errors in dictation or transcription are unintentional. Miguel Aschoff M.D. [facility]

## 2017-06-28 ENCOUNTER — Inpatient Hospital Stay: Payer: PPO | Attending: Oncology

## 2017-06-28 ENCOUNTER — Inpatient Hospital Stay: Payer: PPO | Admitting: Oncology

## 2017-06-28 ENCOUNTER — Inpatient Hospital Stay: Payer: PPO

## 2017-06-28 DIAGNOSIS — Z452 Encounter for adjustment and management of vascular access device: Secondary | ICD-10-CM | POA: Insufficient documentation

## 2017-06-28 DIAGNOSIS — Z95828 Presence of other vascular implants and grafts: Secondary | ICD-10-CM

## 2017-06-28 DIAGNOSIS — C50412 Malignant neoplasm of upper-outer quadrant of left female breast: Secondary | ICD-10-CM | POA: Diagnosis not present

## 2017-06-28 DIAGNOSIS — Z170 Estrogen receptor positive status [ER+]: Secondary | ICD-10-CM | POA: Insufficient documentation

## 2017-06-28 MED ORDER — SODIUM CHLORIDE 0.9% FLUSH
10.0000 mL | INTRAVENOUS | Status: AC | PRN
  Administered 2017-06-28: 10 mL
  Filled 2017-06-28: qty 10

## 2017-06-28 MED ORDER — HEPARIN SOD (PORK) LOCK FLUSH 100 UNIT/ML IV SOLN
500.0000 [IU] | INTRAVENOUS | Status: AC | PRN
  Administered 2017-06-28: 500 [IU]

## 2017-06-28 NOTE — Progress Notes (Unsigned)
CLINIC:  Survivorship   REASON FOR VISIT:  Routine follow-up for history of breast cancer.   BRIEF ONCOLOGIC HISTORY:  Patient was last seen by primary medical oncologist Dr. Grayland Ormond on February 27th 2019. At that visit she was tolerating letrozole well without any side effects. She did complain of insomnia secondary to her peripheral neuropathy. She had not yet started her gabapentin  Patient completed 4 cycles of Adriamycin and Cytoxan in July 2018. She received 3 cycles of weekly Taxol completing in August 2018. Therapy was discontinued altogether given declining performance status and worsening peripheral neuropathy. She did not require adjuvant radiation.   INTERVAL HISTORY:  Ms. Sandra Fritz presents to the Survivorship Clinic today for routine follow-up for her history of breast cancer.  Overall, she reports feeling quite well. ***    REVIEW OF SYSTEMS:  Review of Systems - Oncology Breast: Denies any new nodularity, masses, tenderness, nipple changes, or nipple discharge.     PAST MEDICAL/SURGICAL HISTORY:  Past Medical History:  Diagnosis Date  . Anemia   . Anxiety   . Arthritis   . Blood transfusion without reported diagnosis   . Breast cancer ([place])   . Cataract   . Complication of anesthesia    HARD TO WAKE UP AFTER TONSILLECTOMY  . GERD (gastroesophageal reflux disease)   . Hyperlipidemia   . Hypertension   . Neuromuscular disorder ([place])   . Personal history of chemotherapy   . Primary cancer of upper outer quadrant of left female breast ([place]) 07/28/2016   2.7 cm, T2, ER/PR +, her 2 neu negative prior to neoadjuvant chemotherapy   Past Surgical History:  Procedure Laterality Date  . APPENDECTOMY  1955   SECONDARY TO RUTURED APPENDIX  . CATARACT EXTRACTION, BILATERAL  2011  . COLONOSCOPY  2002  . DILATION AND CURETTAGE OF UTERUS  1989   Dr. Laurey Morale  . JOINT REPLACEMENT  2012   Right hip  . MASTECTOMY W/ SENTINEL NODE BIOPSY Left 12/30/2016   Procedure:  LEFT MASTECTOMY WITH SENTINEL LYMPH NODE BIOPSY;  Surgeon: Robert Bellow, MD;  Location: [facility] ORS;  Service: General;  Laterality: Left;  . MOLE REMOVAL  1957   18 removed by Dr. Hoyle Sauer  . NECK SURGERY  2008   metal in neck  . PORTACATH PLACEMENT Right 08/05/2016   Procedure: INSERTION PORT-A-CATH WITH ULTRASOUND;  Surgeon: Robert Bellow, MD;  Location: [facility] ORS;  Service: General;  Laterality: Right;  . TONSILLECTOMY  1955     ALLERGIES:  Allergies  Allergen Reactions  . Erythromycin Rash and Other (See Comments)    ALL MYCINS  . Keflex [Cephalexin] Hives    Patient does not remember  . Statins Other (See Comments)    Muscle and joint pain-patient states she tried all statins.     CURRENT MEDICATIONS:  Outpatient Encounter Medications as of 06/28/2017  Medication Sig  . aspirin 81 MG tablet Take 81 mg by mouth daily.   Marland Kitchen esomeprazole (NEXIUM) 20 MG capsule Take 1 capsule (20 mg total) by mouth daily.  . furosemide (LASIX) 20 MG tablet Take 20 mg by mouth 2 (two) times daily.  Marland Kitchen gabapentin (NEURONTIN) 400 MG capsule Take 1 capsule (400 mg total) by mouth at bedtime.  Marland Kitchen KLOR-CON M20 20 MEQ tablet TAKE 1 TABLET BY MOUTH TWICE A DAY  . letrozole (FEMARA) 2.5 MG tablet TAKE 1 TABLET BY MOUTH EVERY DAY  . lidocaine-prilocaine (EMLA) cream Apply 1 application topically as needed. Apply to port 1  hour prior to chemotherapy appointment. Cover with plastic wrap. (Patient not taking: Reported on 05/31/2017)  . losartan (COZAAR) 100 MG tablet Take 1 tablet (100 mg total) by mouth daily.  . metolazone (ZAROXOLYN) 2.5 MG tablet   . mirtazapine (REMERON) 7.5 MG tablet Take 15 mg by mouth at bedtime.   . Misc Natural Products (OSTEO BI-FLEX JOINT SHIELD PO) Take 2 tablets by mouth daily.  . Multiple Vitamin (MULTIVITAMIN WITH MINERALS) TABS tablet Take 1 tablet by mouth daily.   . prochlorperazine (COMPAZINE) 10 MG tablet Take 10 mg by mouth every 6 (six) hours as needed for nausea  or vomiting.  . rosuvastatin (CRESTOR) 5 MG tablet TAKE 1 TABLET (5 MG TOTAL) BY MOUTH EVERY OTHER DAY.  . sucralfate (CARAFATE) 1 g tablet TAKE 1 TABLET (1 G TOTAL) BY MOUTH 4 (FOUR) TIMES DAILY - WITH MEALS AND AT BEDTIME.  . [EXPIRED] heparin lock flush 100 unit/mL   . [EXPIRED] sodium chloride flush (NS) 0.9 % injection 10 mL    No facility-administered encounter medications on file as of 06/28/2017.      ONCOLOGIC FAMILY HISTORY:  Family History  Problem Relation Age of Onset  . Macular degeneration Mother   . Hypertension Mother   . Diabetes Father   . Heart disease Father   . Heart attack Father 57  . Heart disease Brother 48  . Dermatomyositis Sister   . Cancer Sister        Carcinoma of the uterine or ovarian all attached to colon and is in remission now  . Breast cancer Maternal Aunt 70  . Breast cancer Paternal Aunt 85  . Breast cancer Cousin        paternal aunt w/ breast ca daughter  . Breast cancer Maternal Aunt 60    GENETIC COUNSELING/TESTING: ***  SOCIAL HISTORY:  MEILA BERKE is /single/married/divorced/widowed/separated and lives alone/with her spouse/family/friend in (city), [place].  She has (#) children and they live in (city).  Ms. Zawistowski is currently retired/disabled/working part-time/full-time as ***.  She denies any current or history of tobacco, alcohol, or illicit drug use.     PHYSICAL EXAMINATION:  Vital Signs: There were no vitals filed for this visit. There were no vitals filed for this visit. General: Well-nourished, well-appearing female in no acute distress.  Unaccompanied/Accompanied by***** today.   HEENT: Head is normocephalic.  Pupils equal and reactive to light. Conjunctivae clear without exudate.  Sclerae anicteric. Oral mucosa is pink, moist.  Oropharynx is pink without lesions or erythema.  Lymph: No cervical, supraclavicular, or infraclavicular lymphadenopathy noted on palpation.  Cardiovascular: Regular rate and  rhythm.Marland Kitchen Respiratory: Clear to auscultation bilaterally. Chest expansion symmetric; breathing non-labored.  Breast Exam:  -Left breast: No appreciable masses on palpation. No skin redness, thickening, or peau d'orange appearance; no nipple retraction or nipple discharge; mild distortion in symmetry at previous lumpectomy site***healed scar without erythema or nodularity.  -Right breast: No appreciable masses on palpation. No skin redness, thickening, or peau d'orange appearance; no nipple retraction or nipple discharge; mild distortion in symmetry at previous lumpectomy site***healed scar without erythema or nodularity. -Axilla: No axillary adenopathy bilaterally.  GI: Abdomen soft and round; non-tender, non-distended. Bowel sounds normoactive. No hepatosplenomegaly.   GU: Deferred.  Neuro: No focal deficits. Steady gait.  Psych: Mood and affect normal and appropriate for situation.  MSK: No focal spinal tenderness to palpation, full range of motion in bilateral upper extremities Extremities: No edema. Skin: Warm and dry.  LABORATORY DATA:  None for this visit***   DIAGNOSTIC IMAGING:  Most recent mammogram: ***    ASSESSMENT AND PLAN:  Ms.. Shear is a pleasant 83 y.o. female with history of Stage *** right/left breast invasive ductal carcinoma, ER+/PR+/HER2-, diagnosed in (date), treated with lumpectomy, adjuvant radiation therapy, and anti-estrogen therapy with *** beginning in (date).  She presents to the Survivorship Clinic for surveillance and routine follow-up.   1. History of breast cancer:  Ms. Welcome is currently clinically and radiographically without evidence of disease or recurrence of breast cancer. She will be due for mammogram in ***; orders placed today.  She will continue her anti-estrogen therapy with ***, with plans to continue for *** years.  She will return to the cancer center to see her medical oncologist, Dr. ***, in ***/2018.  I encouraged her to call me with  any questions or concerns before her next visit at the cancer center, and I would be happy to see her sooner, if needed.    #. Problem(s) at Visit___________________.  #. Bone health:  Given Ms. Gellerman's age, history of breast cancer, and her current anti-estrogen therapy with ________, she is at risk for bone demineralization. Her last DEXA scan was on **/**/20**.  In the meantime, she was encouraged to increase her consumption of foods rich in calcium, as well as increase her weight-bearing activities.  She was given education on specific food and activities to promote bone health.  #. Cancer screening:  Due to Ms. Gallina's history and her age, she should receive screening for skin cancers, colon cancer, and ***gynecologic cancers. She was encouraged to follow-up with her PCP for appropriate cancer screenings.   #. Health maintenance and wellness promotion: Ms. Sotero was encouraged to consume 5-7 servings of fruits and vegetables per day. She was also encouraged to engage in moderate to vigorous exercise for 30 minutes per day most days of the week. She was instructed to limit her alcohol consumption and continue to abstain from tobacco use/was encouraged stop smoking.  ***    Dispo:  -Return to cancer center ***   A total of (30) minutes of face-to-face time was spent with this patient with greater than 50% of that time in counseling and care-coordination.   Faythe Casa, AGNP-C [place] [phone]   Note: PRIMARY CARE PROVIDER Jerrol Banana., MD [phone]

## 2017-07-14 DIAGNOSIS — G620 Drug-induced polyneuropathy: Secondary | ICD-10-CM | POA: Diagnosis not present

## 2017-07-14 DIAGNOSIS — R2689 Other abnormalities of gait and mobility: Secondary | ICD-10-CM | POA: Diagnosis not present

## 2017-07-14 DIAGNOSIS — R634 Abnormal weight loss: Secondary | ICD-10-CM | POA: Diagnosis not present

## 2017-07-14 DIAGNOSIS — R413 Other amnesia: Secondary | ICD-10-CM | POA: Diagnosis not present

## 2017-07-14 DIAGNOSIS — R55 Syncope and collapse: Secondary | ICD-10-CM | POA: Diagnosis not present

## 2017-07-14 DIAGNOSIS — T451X5A Adverse effect of antineoplastic and immunosuppressive drugs, initial encounter: Secondary | ICD-10-CM | POA: Diagnosis not present

## 2017-07-14 DIAGNOSIS — R252 Cramp and spasm: Secondary | ICD-10-CM | POA: Diagnosis not present

## 2017-07-14 DIAGNOSIS — G4700 Insomnia, unspecified: Secondary | ICD-10-CM | POA: Diagnosis not present

## 2017-07-18 ENCOUNTER — Ambulatory Visit
Admission: RE | Admit: 2017-07-18 | Discharge: 2017-07-18 | Disposition: A | Discharge status: Home / Self Care | Payer: PPO | Source: Ambulatory Visit | Attending: General Surgery | Admitting: General Surgery

## 2017-07-18 DIAGNOSIS — Z853 Personal history of malignant neoplasm of breast: Secondary | ICD-10-CM | POA: Insufficient documentation

## 2017-07-18 DIAGNOSIS — R921 Mammographic calcification found on diagnostic imaging of breast: Secondary | ICD-10-CM

## 2017-07-21 ENCOUNTER — Other Ambulatory Visit: Payer: Self-pay | Admitting: Nurse Practitioner

## 2017-07-25 ENCOUNTER — Other Ambulatory Visit: Payer: Self-pay | Admitting: General Surgery

## 2017-07-25 ENCOUNTER — Encounter: Payer: Self-pay | Admitting: General Surgery

## 2017-07-25 ENCOUNTER — Ambulatory Visit (INDEPENDENT_AMBULATORY_CARE_PROVIDER_SITE_OTHER): Payer: PPO | Admitting: General Surgery

## 2017-07-25 VITALS — BP 118/56 | HR 79 | Resp 12 | Ht 69.0 in | Wt 150.0 lb

## 2017-07-25 DIAGNOSIS — R921 Mammographic calcification found on diagnostic imaging of breast: Secondary | ICD-10-CM | POA: Diagnosis not present

## 2017-07-25 DIAGNOSIS — C50412 Malignant neoplasm of upper-outer quadrant of left female breast: Secondary | ICD-10-CM

## 2017-07-25 DIAGNOSIS — Z170 Estrogen receptor positive status [ER+]: Secondary | ICD-10-CM | POA: Diagnosis not present

## 2017-07-25 DIAGNOSIS — R928 Other abnormal and inconclusive findings on diagnostic imaging of breast: Secondary | ICD-10-CM

## 2017-07-25 NOTE — Patient Instructions (Addendum)
The patient is aware to call back for any questions or new concerns. Stereotactic RIGHT Breast Biopsy A breast biopsy is a procedure in which a sample of suspicious breast tissue is removed from your breast. In a stereotactic breast biopsy, an X-ray of the breast (mammogram) is used during the procedure to locate the area of the breast where the tissue sample will be taken. After the procedure, the tissue that is removed from the breast is examined under a microscope to see if cancerous cells are present. You may need a stereotactic breast biopsy if you have:  Any undiagnosed breast mass (tumor).  Calcium deposits (calcifications) or abnormalities seen on a mammogram, ultrasound results, or MRI results.  Nipple abnormalities, dimpling, crusting, or ulcerations.  Abnormal discharge from the nipple, especially blood.  Redness, swelling, and pain of the breast.  Suspicious changes in the breast seen on your mammogram.  If the breast abnormality is found to be cancerous (malignant), a stereotactic breast biopsy can help to determine what the best treatment is for you. Tell a health care provider about:  Any allergies you have.  All medicines you are taking, including vitamins, herbs, eye drops, creams, and over-the-counter medicines.  Any problems you or family members have had with anesthetic medicines.  Any blood disorders you have.  Any surgeries you have had.  Any medical conditions you have.  Whether you are pregnant or may be pregnant. What are the risks? Generally, this is a safe procedure. However, problems may occur, including:  Infection at the needle-insertion site.  Bleeding.  Soreness.  Allergic reactions to medicines.  Bruising and swelling of the breast.  Change in the shape of the breast.  Damage to other tissues.  What happens before the procedure?  Ask your health care provider about: ? Changing or stopping your regular medicines. This is especially  important if you are taking diabetes medicines or blood thinners. ? Taking medicines such as aspirin and ibuprofen. These medicines can thin your blood. Do not take these medicines before your procedure if your health care provider instructs you not to.  Wear a good support bra to the procedure.  Do not put on antiperspirant or deodorant the day of the procedure, because it may cause white spots on the X-ray images.  You may be screened for extra fluid around the lymph nodes (lymphedema).  You will be asked to remove jewelry, dentures, eyeglasses, metal objects, or clothing that might interfere with the X-ray images. What happens during the procedure?  You will lie face-down on a table. Your breast will pass through an opening in the table.  Your breast will be gently compressed into an unchanging (fixed) position between a breast platform and a compression plate. Try to stay as relaxed as possible during the procedure. You will need to stay in one position for the length of the procedure.  X-rays will be used to locate the breast lump.  Your skin will be washed with soap, and you will be given a medicine to numb the area (local anesthetic).  A small incision will be made in your breast.  The tip of the biopsy needle will be directed through the incision. Several small pieces of suspicious tissue will be collected.  Then, a final set of X-ray images will be taken. If they show that the suspicious tissue has been mostly or completely removed, a small clip will be left at the biopsy site. This will allow the biopsy site to be easily located if  the results of the biopsy show that the tissue is cancerous.  The incision will be stitched (sutured) or taped and covered with a bandage (dressing). Your health care provider may apply a bandage that is wrapped tightly around your chest (pressure dressing) and an ice pack to prevent bleeding and swelling in the breast. The procedure may vary among  health care providers and hospitals. What happens after the procedure?  If you are doing well and have no problems, you will be allowed to go home.  It is up to you to get the results of your procedure. Ask your health care provider, or the department that is doing the procedure, when your results will be ready. This information is not intended to replace advice given to you by your health care provider. Make sure you discuss any questions you have with your health care provider. Document Released: 12/04/2002 Document Revised: 11/19/2015 Document Reviewed: 11/19/2015 Elsevier Interactive Patient Education  [employer].  The patient is scheduled for a right breast stereo biopsy at [facility] on 08/01/17.

## 2017-07-25 NOTE — Progress Notes (Signed)
Patient ID: Sandra Fritz, female   DOB: 01/10/1935, 83 y.o.   MRN: [id number]  Chief Complaint  Patient presents with  . Follow-up    HPI Sandra Fritz is a 83 y.o. female.  who presents for her breast cancer follow up and a breast evaluation. She had left mastectomy on 12-30-16. The most recent mammogram was done on 07-18-17.  Patient does perform regular self breast checks and gets regular mammograms done.  No new breast issues. She does admit to dizziness when she stands up ever since chemotherapy. She states they reduced her lasix and potassium. Tolerating letrozole.  HPI  Past Medical History:  Diagnosis Date  . Anemia   . Anxiety   . Arthritis   . Blood transfusion without reported diagnosis   . Breast cancer ([place])   . Cataract   . Complication of anesthesia    HARD TO WAKE UP AFTER TONSILLECTOMY  . GERD (gastroesophageal reflux disease)   . Hyperlipidemia   . Hypertension   . Neuromuscular disorder ([place])   . Personal history of chemotherapy   . Primary cancer of upper outer quadrant of left female breast ([facility]) 07/28/2016   2.7 cm, T2, ER/PR +, her 2 neu negative prior to neoadjuvant chemotherapy    Past Surgical History:  Procedure Laterality Date  . APPENDECTOMY  1955   SECONDARY TO RUTURED APPENDIX  . CATARACT EXTRACTION, BILATERAL  2011  . COLONOSCOPY  2002  . DILATION AND CURETTAGE OF UTERUS  1989   Dr. Laurey Morale  . JOINT REPLACEMENT  2012   Right hip  . MASTECTOMY Left 12/30/2016  . MASTECTOMY W/ SENTINEL NODE BIOPSY Left 12/30/2016   Procedure: LEFT MASTECTOMY WITH SENTINEL LYMPH NODE BIOPSY;  Surgeon: Robert Bellow, MD;  Location: [facility] ORS;  Service: General;  Laterality: Left;  . MOLE REMOVAL  1957   18 removed by Dr. Hoyle Sauer  . NECK SURGERY  2008   metal in neck  . PORTACATH PLACEMENT Right 08/05/2016   Procedure: INSERTION PORT-A-CATH WITH ULTRASOUND;  Surgeon: Robert Bellow, MD;  Location: [facility] ORS;  Service: General;  Laterality:  Right;  . TONSILLECTOMY  1955    Family History  Problem Relation Age of Onset  . Macular degeneration Mother   . Hypertension Mother   . Diabetes Father   . Heart disease Father   . Heart attack Father 67  . Heart disease Brother 72  . Dermatomyositis Sister   . Cancer Sister        Carcinoma of the uterine or ovarian all attached to colon and is in remission now  . Breast cancer Maternal Aunt 70  . Breast cancer Paternal Aunt 45  . Breast cancer Cousin        paternal aunt w/ breast ca daughter  . Breast cancer Maternal Aunt 60    Social History Social History   Tobacco Use  . Smoking status: Never Smoker  . Smokeless tobacco: Never Used  Substance Use Topics  . Alcohol use: Yes    Alcohol/week: 0.0 oz    Comment: Maybe 3 per year  . Drug use: No    Allergies  Allergen Reactions  . Erythromycin Rash and Other (See Comments)    ALL MYCINS  . Keflex [Cephalexin] Hives    Patient does not remember  . Statins Other (See Comments)    Muscle and joint pain-patient states she tried all statins.    Current Outpatient Medications  Medication Sig Dispense Refill  . aspirin  81 MG tablet Take 81 mg by mouth daily.     Marland Kitchen esomeprazole (NEXIUM) 20 MG capsule Take 1 capsule (20 mg total) by mouth daily. 90 capsule 3  . furosemide (LASIX) 20 MG tablet Take 20 mg by mouth daily.     Marland Kitchen gabapentin (NEURONTIN) 400 MG capsule Take 1 capsule (400 mg total) by mouth at bedtime. 90 capsule 3  . KLOR-CON M20 20 MEQ tablet TAKE 1 TABLET BY MOUTH TWICE A DAY (Patient taking differently: TAKE 1 TABLET BY MOUTH A DAY) 60 tablet 0  . letrozole (FEMARA) 2.5 MG tablet TAKE 1 TABLET BY MOUTH EVERY DAY 90 tablet 3  . losartan (COZAAR) 100 MG tablet Take 1 tablet (100 mg total) by mouth daily. 90 tablet 3  . metolazone (ZAROXOLYN) 2.5 MG tablet     . mirtazapine (REMERON) 7.5 MG tablet Take 15 mg by mouth at bedtime.     . Misc Natural Products (OSTEO BI-FLEX JOINT SHIELD PO) Take 2 tablets by  mouth daily.    . Multiple Vitamin (MULTIVITAMIN WITH MINERALS) TABS tablet Take 1 tablet by mouth daily.     . prochlorperazine (COMPAZINE) 10 MG tablet Take 10 mg by mouth every 6 (six) hours as needed for nausea or vomiting.    . rosuvastatin (CRESTOR) 5 MG tablet TAKE 1 TABLET (5 MG TOTAL) BY MOUTH EVERY OTHER DAY. 45 tablet 3  . sucralfate (CARAFATE) 1 g tablet TAKE 1 TABLET (1 G TOTAL) BY MOUTH 4 (FOUR) TIMES DAILY - WITH MEALS AND AT BEDTIME. 120 tablet 4  . lidocaine-prilocaine (EMLA) cream Apply 1 application topically as needed. Apply to port 1 hour prior to chemotherapy appointment. Cover with plastic wrap. (Patient not taking: Reported on 07/25/2017) 30 g 2   No current facility-administered medications for this visit.     Review of Systems Review of Systems  Constitutional: Negative.   Respiratory: Negative.   Cardiovascular: Negative.   Neurological: Positive for dizziness.    Blood pressure (!) 118/56, pulse 79, resp. rate 12, height 5\' 9"  (1.753 m), weight 150 lb (68 kg), SpO2 99 %.  Physical Exam Physical Exam  Constitutional: She is oriented to person, place, and time. She appears well-developed and well-nourished.  HENT:  Mouth/Throat: Oropharynx is clear and moist.  Eyes: Conjunctivae are normal. No scleral icterus.  Neck: Neck supple.  Cardiovascular: Normal rate, regular rhythm and normal heart sounds.  Pulmonary/Chest: Effort normal and breath sounds normal. Right breast exhibits no inverted nipple, no mass, no nipple discharge, no skin change and no tenderness.    Lymphadenopathy:    She has no cervical adenopathy.    She has no axillary adenopathy.       Right: No supraclavicular adenopathy present.       Left: No supraclavicular adenopathy present.  Neurological: She is alert and oriented to person, place, and time.  Skin: Skin is warm and dry.  Psychiatric: Her behavior is normal.    Data Reviewed Right breast diagnostic mammogram of July 18, 2017  was reviewed and compared to last years study.  The original area of microcalcifications has become more prominent and an adjacent area has developed.  BI-RADS-4.  Assessment    Doing well status post left mastectomy.  Progressive microcalcifications of the right breast.  Persistent modest postural hypotension.    Plan    The patient has had cervical surgery preventing easy positioning on a prone table.  For that reason we will request radiology stereotactic biopsy of the  right breast in the seated unit.  The patient's continued low blood pressure on the lower dose of Lasix has been forwarded to her primary care physician.      HPI, Physical Exam, Assessment and Plan have been scribed under the direction and in the presence of Robert Bellow, MD. Karie Fetch, RN  .I have completed the exam and reviewed the above documentation for accuracy and completeness.  I agree with the above.  Haematologist has been used and any errors in dictation or transcription are unintentional.  Hervey Ard, M.D., F.A.C.S.  The patient is scheduled for a right breast stereo biopsy at [facility] on 08/01/17. Documented by Caryl-Lyn Otis Brace LPN  [place] 07/26/2017, 5:21 PM

## 2017-07-26 NOTE — Progress Notes (Signed)
Dr Bary Castilla sent information that the patient was having continued hypotension and dizziness with standing while at his office on 07/25/17.  The patient has been advised to decrease her Lasix to q am from BID, she is to weight daily and call for increase of 2 or more pounds in 1 day and she was given a follow up appointment in 3 weeks with Dr Rosanna Randy. Chart showed her taking the Lasix once daily but it was confirmed with the patient that she had been taking it BID and will now decrease dose.

## 2017-07-30 ENCOUNTER — Other Ambulatory Visit: Payer: Self-pay | Admitting: Family Medicine

## 2017-07-30 DIAGNOSIS — R600 Localized edema: Secondary | ICD-10-CM

## 2017-08-01 ENCOUNTER — Ambulatory Visit
Admission: RE | Admit: 2017-08-01 | Discharge: 2017-08-01 | Disposition: A | Discharge status: Home / Self Care | Payer: PPO | Source: Ambulatory Visit | Attending: General Surgery | Admitting: General Surgery

## 2017-08-01 DIAGNOSIS — R921 Mammographic calcification found on diagnostic imaging of breast: Secondary | ICD-10-CM | POA: Diagnosis not present

## 2017-08-01 DIAGNOSIS — R928 Other abnormal and inconclusive findings on diagnostic imaging of breast: Secondary | ICD-10-CM | POA: Insufficient documentation

## 2017-08-01 DIAGNOSIS — N62 Hypertrophy of breast: Secondary | ICD-10-CM | POA: Insufficient documentation

## 2017-08-01 DIAGNOSIS — R920 Mammographic microcalcification found on diagnostic imaging of breast: Secondary | ICD-10-CM | POA: Diagnosis not present

## 2017-08-01 DIAGNOSIS — N6021 Fibroadenosis of right breast: Secondary | ICD-10-CM | POA: Diagnosis not present

## 2017-08-01 HISTORY — PX: BREAST BIOPSY: SHX20

## 2017-08-03 LAB — SURGICAL PATHOLOGY

## 2017-08-07 ENCOUNTER — Telehealth: Payer: Self-pay | Admitting: General Surgery

## 2017-08-07 DIAGNOSIS — I6523 Occlusion and stenosis of bilateral carotid arteries: Secondary | ICD-10-CM | POA: Diagnosis not present

## 2017-08-07 DIAGNOSIS — E782 Mixed hyperlipidemia: Secondary | ICD-10-CM | POA: Diagnosis not present

## 2017-08-07 DIAGNOSIS — R42 Dizziness and giddiness: Secondary | ICD-10-CM | POA: Diagnosis not present

## 2017-08-07 DIAGNOSIS — I10 Essential (primary) hypertension: Secondary | ICD-10-CM | POA: Diagnosis not present

## 2017-08-07 NOTE — Telephone Encounter (Signed)
Notified pathology was fine. Reports she tolerated the procedure well.

## 2017-08-09 ENCOUNTER — Inpatient Hospital Stay: Payer: PPO

## 2017-08-09 ENCOUNTER — Inpatient Hospital Stay (HOSPITAL_BASED_OUTPATIENT_CLINIC_OR_DEPARTMENT_OTHER): Payer: PPO | Admitting: Oncology

## 2017-08-09 ENCOUNTER — Inpatient Hospital Stay: Payer: PPO | Attending: Oncology

## 2017-08-09 VITALS — BP 147/67 | HR 98 | Temp 97.9°F | Wt 150.8 lb

## 2017-08-09 DIAGNOSIS — G629 Polyneuropathy, unspecified: Secondary | ICD-10-CM

## 2017-08-09 DIAGNOSIS — G4700 Insomnia, unspecified: Secondary | ICD-10-CM | POA: Insufficient documentation

## 2017-08-09 DIAGNOSIS — Z7982 Long term (current) use of aspirin: Secondary | ICD-10-CM | POA: Diagnosis not present

## 2017-08-09 DIAGNOSIS — Z9012 Acquired absence of left breast and nipple: Secondary | ICD-10-CM

## 2017-08-09 DIAGNOSIS — Z79899 Other long term (current) drug therapy: Secondary | ICD-10-CM | POA: Insufficient documentation

## 2017-08-09 DIAGNOSIS — Z170 Estrogen receptor positive status [ER+]: Secondary | ICD-10-CM | POA: Diagnosis not present

## 2017-08-09 DIAGNOSIS — D649 Anemia, unspecified: Secondary | ICD-10-CM

## 2017-08-09 DIAGNOSIS — M8580 Other specified disorders of bone density and structure, unspecified site: Secondary | ICD-10-CM | POA: Insufficient documentation

## 2017-08-09 DIAGNOSIS — Z923 Personal history of irradiation: Secondary | ICD-10-CM | POA: Insufficient documentation

## 2017-08-09 DIAGNOSIS — Z95828 Presence of other vascular implants and grafts: Secondary | ICD-10-CM

## 2017-08-09 DIAGNOSIS — E785 Hyperlipidemia, unspecified: Secondary | ICD-10-CM | POA: Insufficient documentation

## 2017-08-09 DIAGNOSIS — Z803 Family history of malignant neoplasm of breast: Secondary | ICD-10-CM

## 2017-08-09 DIAGNOSIS — I10 Essential (primary) hypertension: Secondary | ICD-10-CM | POA: Diagnosis not present

## 2017-08-09 DIAGNOSIS — F419 Anxiety disorder, unspecified: Secondary | ICD-10-CM | POA: Insufficient documentation

## 2017-08-09 DIAGNOSIS — K219 Gastro-esophageal reflux disease without esophagitis: Secondary | ICD-10-CM | POA: Insufficient documentation

## 2017-08-09 DIAGNOSIS — C50412 Malignant neoplasm of upper-outer quadrant of left female breast: Secondary | ICD-10-CM

## 2017-08-09 DIAGNOSIS — Z9221 Personal history of antineoplastic chemotherapy: Secondary | ICD-10-CM | POA: Insufficient documentation

## 2017-08-09 DIAGNOSIS — Z79811 Long term (current) use of aromatase inhibitors: Secondary | ICD-10-CM

## 2017-08-09 MED ORDER — SODIUM CHLORIDE 0.9% FLUSH
10.0000 mL | Freq: Once | INTRAVENOUS | Status: AC
  Administered 2017-08-09: 10 mL via INTRAVENOUS
  Filled 2017-08-09: qty 10

## 2017-08-09 MED ORDER — HEPARIN SOD (PORK) LOCK FLUSH 100 UNIT/ML IV SOLN
500.0000 [IU] | Freq: Once | INTRAVENOUS | Status: AC
  Administered 2017-08-09: 500 [IU] via INTRAVENOUS

## 2017-08-09 NOTE — Progress Notes (Signed)
Survivorship Care Plan visit completed.  Treatment summary reviewed and given to patient.  ASCO answers booklet reviewed and given to patient.  CARE program and Cancer Transitions discussed with patient along with other resources cancer center offers to patients and caregivers.  Patient verbalized understanding.    

## 2017-08-09 NOTE — Progress Notes (Signed)
[place] [facility]  [phone] [phone]  Patient Care Team: Jerrol Banana., MD as PCP - General (Unknown Physician Specialty) Bary Castilla Forest Gleason, MD (General Surgery) Lloyd Huger, MD as Consulting Physician (Oncology) Rico Junker, RN as Oncology Nurse Navigator Vladimir Crofts, MD as Consulting Physician (Neurology)   Name of the patient: Sandra Fritz  [id number]  1934-09-28   Date of visit: 08/16/17  CLINIC:  Survivorship   REASON FOR VISIT:  Routine follow-up post-treatment for a recent history of breast cancer.  BRIEF ONCOLOGIC HISTORY:  Patient was last seen by primary medical oncologist Dr. Grayland Ormond on 05/17/17.Marland Kitchen At that visit she was tolerating letrozole well without any side effects. She did complain of insomnia secondary to her peripheral neuropathy. She had not yet started her gabapentin.  Patient completed 4 cycles of Adriamycin and Cytoxan in July 2018. She received 3 cycles of weekly Taxol completing in August 2018. Therapy was discontinued altogether given declining performance status and worsening peripheral neuropathy. She did not require adjuvant radiation.  INTERVAL HISTORY:  Patient presents to the survivorship clinic today for initial meeting to review her survivorship care plan detailing her treatment course for breast cancer, as well as monitoring long-term side effects of that treatment, education regarding health maintenance, screening, and overall wellness and health promotion.  Overall, she reports feeling well since completing treatment.  States peripheral neuropathy is better since beginning gabapentin.  She continues to tolerate her letrozole well.  States her weakness and fatigue is improving.  She is continuing her calcium and vitamin D as prescribed.  Most recent DEXA (03/2017) revealed a T score of -9.7 representing osteopenia.  REVIEW OF SYSTEMS:  Review of  Systems  Constitutional: Negative.  Negative for chills, fever, malaise/fatigue and weight loss.  HENT: Negative for congestion and ear pain.   Eyes: Negative.  Negative for blurred vision and double vision.  Respiratory: Negative.  Negative for cough, sputum production and shortness of breath.   Cardiovascular: Negative.  Negative for chest pain, palpitations and leg swelling.  Gastrointestinal: Negative.  Negative for abdominal pain, constipation, diarrhea, nausea and vomiting.  Genitourinary: Negative for dysuria, frequency and urgency.  Musculoskeletal: Negative for back pain and falls.  Skin: Negative.  Negative for rash.  Neurological: Positive for sensory change (Improving). Negative for weakness and headaches.  Endo/Heme/Allergies: Negative.  Does not bruise/bleed easily.  Psychiatric/Behavioral: Negative for depression. The patient has insomnia (Secondary to neuropathy-improving). The patient is not nervous/anxious.     Physical Exam  Constitutional: She is oriented to person, place, and time and well-developed, well-nourished, and in no distress. Vital signs are normal.  HENT:  Head: Normocephalic and atraumatic.  Eyes: Pupils are equal, round, and reactive to light.  Neck: Normal range of motion.  Cardiovascular: Normal rate, regular rhythm and normal heart sounds.  No murmur heard. Pulmonary/Chest: Effort normal and breath sounds normal. She has no wheezes.  Abdominal: Soft. Normal appearance and bowel sounds are normal. She exhibits no distension. There is no tenderness.  Musculoskeletal: Normal range of motion. She exhibits no edema.  Neurological: She is alert and oriented to person, place, and time. Gait normal.  Skin: Skin is warm and dry. No rash noted.  Psychiatric: Mood, memory, affect and judgment normal.  Breast: Denies any new nodularity, masses, tenderness, nipple changes, or nipple discharge. She performs self breast exams.   ONCOLOGY TREATMENT TEAM:  1.  Surgeon:  Dr. Bary Castilla 2. Medical Oncologist: Dr.  Finnegan   PAST MEDICAL/SURGICAL HISTORY:  Past Medical History:  Diagnosis Date  . Anemia   . Anxiety   . Arthritis   . Blood transfusion without reported diagnosis   . Breast cancer ([place])   . Cataract   . Complication of anesthesia    HARD TO WAKE UP AFTER TONSILLECTOMY  . GERD (gastroesophageal reflux disease)   . Hyperlipidemia   . Hypertension   . Neuromuscular disorder ([place])   . Personal history of chemotherapy   . Primary cancer of upper outer quadrant of left female breast ([place]) 07/28/2016   2.7 cm, T2, ER/PR +, her 2 neu negative prior to neoadjuvant chemotherapy   Past Surgical History:  Procedure Laterality Date  . APPENDECTOMY  1955   SECONDARY TO RUTURED APPENDIX  . BREAST BIOPSY Right 08/01/2017   stereo x clip path pending  . CATARACT EXTRACTION, BILATERAL  2011  . COLONOSCOPY  2002  . DILATION AND CURETTAGE OF UTERUS  1989   Dr. Laurey Morale  . JOINT REPLACEMENT  2012   Right hip  . MASTECTOMY Left 12/30/2016  . MASTECTOMY W/ SENTINEL NODE BIOPSY Left 12/30/2016   Procedure: LEFT MASTECTOMY WITH SENTINEL LYMPH NODE BIOPSY;  Surgeon: Robert Bellow, MD;  Location: [facility] ORS;  Service: General;  Laterality: Left;  . MOLE REMOVAL  1957   18 removed by Dr. Hoyle Sauer  . NECK SURGERY  2008   metal in neck  . PORTACATH PLACEMENT Right 08/05/2016   Procedure: INSERTION PORT-A-CATH WITH ULTRASOUND;  Surgeon: Robert Bellow, MD;  Location: [facility] ORS;  Service: General;  Laterality: Right;  . TONSILLECTOMY  1955     ALLERGIES:  Allergies  Allergen Reactions  . Erythromycin Rash and Other (See Comments)    ALL MYCINS  . Keflex [Cephalexin] Hives    Patient does not remember  . Statins Other (See Comments)    Muscle and joint pain-patient states she tried all statins.     CURRENT MEDICATIONS:  Outpatient Encounter Medications as of 08/09/2017  Medication Sig  . aspirin 81 MG tablet Take 81 mg by mouth  daily.   Marland Kitchen esomeprazole (NEXIUM) 20 MG capsule Take 1 capsule (20 mg total) by mouth daily.  . furosemide (LASIX) 20 MG tablet Take 20 mg by mouth daily.   Marland Kitchen gabapentin (NEURONTIN) 400 MG capsule Take 1 capsule (400 mg total) by mouth at bedtime.  Marland Kitchen KLOR-CON M20 20 MEQ tablet TAKE 1 TABLET BY MOUTH TWICE A DAY (Patient taking differently: TAKE 1 TABLET BY MOUTH A DAY)  . letrozole (FEMARA) 2.5 MG tablet TAKE 1 TABLET BY MOUTH EVERY DAY  . mirtazapine (REMERON) 7.5 MG tablet Take 15 mg by mouth at bedtime.   . Misc Natural Products (OSTEO BI-FLEX JOINT SHIELD PO) Take 2 tablets by mouth daily.  . Multiple Vitamin (MULTIVITAMIN WITH MINERALS) TABS tablet Take 1 tablet by mouth daily.   . rosuvastatin (CRESTOR) 5 MG tablet TAKE 1 TABLET (5 MG TOTAL) BY MOUTH EVERY OTHER DAY.  . [DISCONTINUED] losartan (COZAAR) 100 MG tablet Take 1 tablet (100 mg total) by mouth daily.  Marland Kitchen lidocaine-prilocaine (EMLA) cream Apply 1 application topically as needed. Apply to port 1 hour prior to chemotherapy appointment. Cover with plastic wrap. (Patient not taking: Reported on 07/25/2017)  . metolazone (ZAROXOLYN) 2.5 MG tablet   . metolazone (ZAROXOLYN) 2.5 MG tablet TAKE 1 TABLET (2.5 MG TOTAL) DAILY BY MOUTH. (Patient not taking: Reported on 08/09/2017)  . prochlorperazine (COMPAZINE) 10 MG tablet Take 10  mg by mouth every 6 (six) hours as needed for nausea or vomiting.  . sucralfate (CARAFATE) 1 g tablet TAKE 1 TABLET (1 G TOTAL) BY MOUTH 4 (FOUR) TIMES DAILY - WITH MEALS AND AT BEDTIME. (Patient not taking: Reported on 08/09/2017)   No facility-administered encounter medications on file as of 08/09/2017.      ONCOLOGIC FAMILY HISTORY:  Family History  Problem Relation Age of Onset  . Macular degeneration Mother   . Hypertension Mother   . Diabetes Father   . Heart disease Father   . Heart attack Father 44  . Heart disease Brother 61  . Dermatomyositis Sister   . Cancer Sister        Carcinoma of the uterine  or ovarian all attached to colon and is in remission now  . Breast cancer Maternal Aunt 70  . Breast cancer Paternal Aunt 60  . Breast cancer Cousin        paternal aunt w/ breast ca daughter  . Breast cancer Maternal Aunt 60     GENETIC COUNSELING/TESTING: Negative   SOCIAL HISTORY:  MILLIANA REDDOCH is married and lives locally with spouse.  She denies any current or history of tobacco, alcohol, or illicit drug use.     PHYSICAL EXAMINATION:  Vital Signs:   Vitals:   08/09/17 1301  BP: (!) 147/67  Pulse: 98  Temp: 97.9 F (36.6 C)   Filed Weights   08/09/17 1301  Weight: 150 lb 12.8 oz (68.4 kg)    LABORATORY DATA:  Most recent labs are from 05/17/17:  CBC    Component Value Date/Time   WBC 4.8 05/17/2017 1418   RBC 3.51 (L) 05/17/2017 1418   HGB 10.4 (L) 05/17/2017 1418   HGB 11.1 07/25/2016 1225   HCT 30.7 (L) 05/17/2017 1418   HCT 33.6 (L) 07/25/2016 1225   PLT 288 05/17/2017 1418   PLT 386 (H) 07/25/2016 1225   MCV 87.4 05/17/2017 1418   MCV 82 07/25/2016 1225   MCV 87 10/29/2013 0645   MCH 29.7 05/17/2017 1418   MCHC 34.0 05/17/2017 1418   RDW 15.2 (H) 05/17/2017 1418   RDW 15.6 (H) 07/25/2016 1225   RDW 13.7 10/29/2013 0645   LYMPHSABS 1.3 05/17/2017 1418   LYMPHSABS 2.9 07/25/2016 1225   LYMPHSABS 2.3 10/29/2013 0645   MONOABS 0.4 05/17/2017 1418   MONOABS 0.3 10/29/2013 0645   EOSABS 0.1 05/17/2017 1418   EOSABS 0.3 07/25/2016 1225   EOSABS 0.2 10/29/2013 0645   BASOSABS 0.0 05/17/2017 1418   BASOSABS 0.0 07/25/2016 1225   BASOSABS 0.1 10/29/2013 0645    DIAGNOSTIC IMAGING: 07/18/17 diagnostic mammogram Indeterminate 3 mm group of fine pleomorphic calcifications involving the slight OUTER subareolar RIGHT breast which have become slightly more pleomorphic in morphology since the prior mammograms 6 months and 1 year ago.  RECOMMENDATION: Stereotactic core needle biopsy of the indeterminate RIGHT breast Calcifications.  BI-RADS  CATEGORY  4: Suspicious  Had biopsy performed on 07/22/2017  DIAGNOSIS:  A. BREAST, RIGHT, SLIGHTLY OUTER RETROAREOLAR; STEREOTACTIC-GUIDED CORE  BIOPSY:  - USUAL DUCTAL HYPERPLASIA AND COLUMNAR CELL CHANGE ASSOCIATED WITH  CALCIFICATIONS.  - NEGATIVE FOR ATYPIA AND MALIGNANCY.    ASSESSMENT AND PLAN:  Ms.. Hunsucker is a pleasant 83 y.o. female with Stage 1B left breast invasive ductal carcinoma, ER+/PR+/HER2-, diagnosed in 07/28/16, treated with left mastectomy, adjuvant radiation therapy, and anti-estrogen therapy with Letrozole beginning in October 2018.  She presents to the Survivorship Clinic for our initial meeting  and routine follow-up post-completion of treatment for breast cancer.    1. Stage 1B Left breast cancer:  Ms. Mcclaine is continuing to recover from definitive treatment for breast cancer. She will follow-up with her medical oncologist, Dr. Grayland Ormond in August /2019 with history and physical exam per surveillance protocol.  She will continue her anti-estrogen therapy with Letrozole. Thus far, she is tolerating the Letrozole well, with minimal side effects. She was instructed to make Dr. Grayland Ormond or myself aware if she begins to experience any worsening side effects of the medication and I could see her back in clinic to help manage those side effects, as needed. Though the incidence is low, there is an associated risk of endometrial cancer with anti-estrogen therapies.  Ms. Yankey was encouraged to contact Dr. Grayland Ormond or myself with any vaginal bleeding while taking anti-estrogen therapy. Other side effects of anti-estrogen therapies were again reviewed with her as well.   Today, a comprehensive survivorship care plan and treatment summary was reviewed with the patient today detailing her breast cancer diagnosis, treatment course, potential late/long-term effects of treatment, appropriate follow-up care with recommendations for the future, and patient education resources.  A copy of  this summary, along with a letter will be sent to the patient's primary care provider via mail/fax/In Basket message after today's visit.    #. Problem(s) at Visit  1.  Improving peripheral neuropathy since starting on gabapentin   2.  Improving insomnia secondary to improving peripheral neuropathy  #. Port-a-Cath: Patient had port placed for administration of chemotherapy. It remains in place at this time. Discussed the need to flush the port every 6 to 8 weeks. We also discussed that her medical oncologist will likely consider the removal of the port when she is 1 year post treatment.  Patient verbalizes understanding and agrees to comply with port flush schedule.  #. Bone health:  Given Ms. Moseley's age/history of breast cancer and her current treatment regimen including anti-estrogen therapy with Letrozole she is at risk for bone demineralization.  Her last DEXA scan was 03/2017, which showed T score -1.7 revealing osteopenia.  She was encouraged to continue taking calcium and vitamin D.  She was encouraged to increase her consumption of foods rich in calcium, as well as increase her weight-bearing activities.  She was given education on specific activities to promote bone health.  #. Cancer screening:  Due to Ms. Andress's history and her age, she should receive screening for skin cancers, colon cancer, and gynecologic cancers.  The information and recommendations are listed on the patient's comprehensive care plan/treatment summary and were reviewed in detail with the patient.    #. Health maintenance and wellness promotion: Ms. Labuda was encouraged to consume 5-7 servings of fruits and vegetables per day. We reviewed the "Nutrition Rainbow" handout, as well as the handout "Take Control of Your Health and Reduce Your Cancer Risk" from the [employer] [place].  She was also encouraged to engage in moderate to vigorous exercise for 30 minutes per day most days of the week. We discussed the  [employer] fitness program, which is designed for cancer survivors to help them become more physically fit after cancer treatments as well as the CARE program.  She was instructed to limit her alcohol consumption and continue to abstain from tobacco use.   #. Support services/counseling: It is not uncommon for this period of the patient's cancer care trajectory to be one of many emotions and stressors.  We discussed an opportunity for  her to participate in the next session of [facility] ("Finding Your New Normal") support group series designed for patients after they have completed treatment.   Ms. Borel was encouraged to take advantage of our many other support services programs, support groups, and/or counseling in coping with her new life as a cancer survivor after completing anti-cancer treatment.  She was offered support today through active listening and expressive supportive counseling.  She was given information regarding our available services and encouraged to contact me with any questions or for help enrolling in any of our support group/programs.    Dispo:   -Return to cancer center on 11/15/17 as scheduled to see Dr. Grayland Ormond. -She is welcome to return back to the Survivorship Clinic at any time; no additional follow-up needed at this time.  -Consider referral back to survivorship as a long-term survivor for continued surveillance  A total of (25) minutes of face-to-face time was spent with this patient with greater than 50% of that time in counseling and care-coordination.   Faythe Casa, AGNP-C [place] [facility] [place] (work cell) [phone] (office) 08/16/17 1:02 PM     Note: PRIMARY CARE PROVIDER Jerrol Banana., MD [phone]

## 2017-08-14 ENCOUNTER — Other Ambulatory Visit: Payer: Self-pay | Admitting: Family Medicine

## 2017-08-14 DIAGNOSIS — I10 Essential (primary) hypertension: Secondary | ICD-10-CM

## 2017-08-17 ENCOUNTER — Encounter: Payer: Self-pay | Admitting: Family Medicine

## 2017-08-17 ENCOUNTER — Ambulatory Visit (INDEPENDENT_AMBULATORY_CARE_PROVIDER_SITE_OTHER): Payer: PPO | Admitting: Family Medicine

## 2017-08-17 VITALS — BP 126/60 | HR 72 | Temp 97.9°F | Resp 16 | Wt 147.0 lb

## 2017-08-17 DIAGNOSIS — I10 Essential (primary) hypertension: Secondary | ICD-10-CM | POA: Diagnosis not present

## 2017-08-17 DIAGNOSIS — E785 Hyperlipidemia, unspecified: Secondary | ICD-10-CM

## 2017-08-17 DIAGNOSIS — R739 Hyperglycemia, unspecified: Secondary | ICD-10-CM

## 2017-08-17 DIAGNOSIS — D649 Anemia, unspecified: Secondary | ICD-10-CM

## 2017-08-17 DIAGNOSIS — N189 Chronic kidney disease, unspecified: Secondary | ICD-10-CM

## 2017-08-17 NOTE — Progress Notes (Signed)
Patient: Sandra Fritz Female    DOB: 04-Mar-1935   83 y.o.   MRN: [id number] Visit Date: 08/17/2017  Today's Provider: Wilhemena Durie, MD   Chief Complaint  Patient presents with  . Hypotension   Subjective:    HPI Patient comes in today for a follow up on hypotension. While at her visit with Dr. Bary Castilla, the patient's blood pressure was "low" and she was advised to decrease Lasix to once tablet daily.      Patient reports that her dizziness has improved since she has decreased Lasix.   Allergies  Allergen Reactions  . Erythromycin Rash and Other (See Comments)    ALL MYCINS  . Keflex [Cephalexin] Hives    Patient does not remember  . Statins Other (See Comments)    Muscle and joint pain-patient states she tried all statins.     Current Outpatient Medications:  .  aspirin 81 MG tablet, Take 81 mg by mouth daily. , Disp: , Rfl:  .  esomeprazole (NEXIUM) 20 MG capsule, Take 1 capsule (20 mg total) by mouth daily., Disp: 90 capsule, Rfl: 3 .  furosemide (LASIX) 20 MG tablet, Take 20 mg by mouth daily. , Disp: , Rfl:  .  gabapentin (NEURONTIN) 400 MG capsule, Take 1 capsule (400 mg total) by mouth at bedtime., Disp: 90 capsule, Rfl: 3 .  KLOR-CON M20 20 MEQ tablet, TAKE 1 TABLET BY MOUTH TWICE A DAY (Patient taking differently: TAKE 1 TABLET BY MOUTH A DAY), Disp: 60 tablet, Rfl: 0 .  letrozole (FEMARA) 2.5 MG tablet, TAKE 1 TABLET BY MOUTH EVERY DAY, Disp: 90 tablet, Rfl: 3 .  losartan (COZAAR) 100 MG tablet, TAKE 1 TABLET BY MOUTH EVERY DAY, Disp: 90 tablet, Rfl: 3 .  metolazone (ZAROXOLYN) 2.5 MG tablet, , Disp: , Rfl:  .  mirtazapine (REMERON) 7.5 MG tablet, Take 15 mg by mouth at bedtime. , Disp: , Rfl:  .  Misc Natural Products (OSTEO BI-FLEX JOINT SHIELD PO), Take 2 tablets by mouth daily., Disp: , Rfl:  .  Multiple Vitamin (MULTIVITAMIN WITH MINERALS) TABS tablet, Take 1 tablet by mouth daily. , Disp: , Rfl:  .  prochlorperazine (COMPAZINE) 10 MG tablet,  Take 10 mg by mouth every 6 (six) hours as needed for nausea or vomiting., Disp: , Rfl:  .  rosuvastatin (CRESTOR) 5 MG tablet, TAKE 1 TABLET (5 MG TOTAL) BY MOUTH EVERY OTHER DAY., Disp: 45 tablet, Rfl: 3 .  lidocaine-prilocaine (EMLA) cream, Apply 1 application topically as needed. Apply to port 1 hour prior to chemotherapy appointment. Cover with plastic wrap. (Patient not taking: Reported on 07/25/2017), Disp: 30 g, Rfl: 2 .  metolazone (ZAROXOLYN) 2.5 MG tablet, TAKE 1 TABLET (2.5 MG TOTAL) DAILY BY MOUTH. (Patient not taking: Reported on 08/09/2017), Disp: 30 tablet, Rfl: 5 .  sucralfate (CARAFATE) 1 g tablet, TAKE 1 TABLET (1 G TOTAL) BY MOUTH 4 (FOUR) TIMES DAILY - WITH MEALS AND AT BEDTIME. (Patient not taking: Reported on 08/09/2017), Disp: 120 tablet, Rfl: 4  Review of Systems  Constitutional: Negative for activity change.  Eyes: Negative.   Respiratory: Negative.   Cardiovascular: Negative for chest pain, palpitations and leg swelling.  Gastrointestinal: Negative.   Endocrine: Negative.   Musculoskeletal: Negative.   Skin: Positive for color change.  Allergic/Immunologic: Negative.   Hematological: Negative.   Psychiatric/Behavioral: Negative.     Social History   Tobacco Use  . Smoking status: Never Smoker  . Smokeless  tobacco: Never Used  Substance Use Topics  . Alcohol use: Yes    Alcohol/week: 0.0 oz    Comment: Maybe 3 per year   Objective:   BP 126/60 (BP Location: Left Arm, Patient Position: Sitting, Cuff Size: Normal)   Pulse 72   Temp 97.9 F (36.6 C)   Resp 16   Wt 147 lb (66.7 kg)   BMI 21.71 kg/m  Vitals:   08/17/17 1545  BP: 126/60  Pulse: 72  Resp: 16  Temp: 97.9 F (36.6 C)  Weight: 147 lb (66.7 kg)     Physical Exam  Constitutional: She is oriented to person, place, and time. She appears well-developed and well-nourished.  HENT:  Head: Normocephalic and atraumatic.  Nose: Nose normal.  Eyes: Conjunctivae are normal. No scleral icterus.    Neck: No thyromegaly present.  Cardiovascular: Normal rate, regular rhythm and normal heart sounds.  Pulmonary/Chest: Effort normal and breath sounds normal.  Abdominal: Soft.  Musculoskeletal: She exhibits no edema.  Neurological: She is alert and oriented to person, place, and time.  Skin: Skin is warm and dry.  Psychiatric: She has a normal mood and affect. Her behavior is normal. Judgment and thought content normal.        Assessment & Plan:     1. Essential hypertension Better off of lasix. - Comprehensive metabolic panel - TSH  2. Hyperglycemia  - Hemoglobin A1c  3. Hyperlipidemia, unspecified hyperlipidemia type  - Lipid panel  4. Anemia, unspecified type  - CBC with Differential/Platelet  5. Chronic kidney disease, unspecified CKD stage  - Comprehensive metabolic panel      I have done the exam and reviewed the above chart and it is accurate to the best of my knowledge. Development worker, community has been used in this note in any air is in the dictation or transcription are unintentional.  Wilhemena Durie, MD  [place]

## 2017-08-18 LAB — LIPID PANEL
CHOLESTEROL TOTAL: 224 mg/dL — AB (ref 100–199)
Chol/HDL Ratio: 2.9 ratio (ref 0.0–4.4)
HDL: 76 mg/dL (ref >39)
LDL CALC: 130 mg/dL — AB (ref 0–99)
TRIGLYCERIDES: 90 mg/dL (ref 0–149)
VLDL Cholesterol Cal: 18 mg/dL (ref 5–40)

## 2017-08-18 LAB — CBC WITH DIFFERENTIAL/PLATELET
BASOS: 1 %
Basophils Absolute: 0 10*3/uL (ref 0.0–0.2)
EOS (ABSOLUTE): 0.1 10*3/uL (ref 0.0–0.4)
EOS: 1 %
HEMATOCRIT: 34.2 % (ref 34.0–46.6)
Hemoglobin: 11.5 g/dL (ref 11.1–15.9)
IMMATURE GRANS (ABS): 0 10*3/uL (ref 0.0–0.1)
IMMATURE GRANULOCYTES: 0 %
Lymphocytes Absolute: 1.1 10*3/uL (ref 0.7–3.1)
Lymphs: 25 %
MCH: 28 pg (ref 26.6–33.0)
MCHC: 33.6 g/dL (ref 31.5–35.7)
MCV: 83 fL (ref 79–97)
MONOCYTES: 10 %
MONOS ABS: 0.4 10*3/uL (ref 0.1–0.9)
NEUTROS PCT: 63 %
Neutrophils Absolute: 2.9 10*3/uL (ref 1.4–7.0)
Platelets: 289 10*3/uL (ref 150–450)
RBC: 4.1 x10E6/uL (ref 3.77–5.28)
RDW: 15.8 % — AB (ref 12.3–15.4)
WBC: 4.4 10*3/uL (ref 3.4–10.8)

## 2017-08-18 LAB — COMPREHENSIVE METABOLIC PANEL
ALT: 17 IU/L (ref 0–32)
AST: 23 IU/L (ref 0–40)
Albumin/Globulin Ratio: 2 (ref 1.2–2.2)
Albumin: 4.7 g/dL (ref 3.5–4.7)
Alkaline Phosphatase: 74 IU/L (ref 39–117)
BUN/Creatinine Ratio: 20 (ref 12–28)
BUN: 25 mg/dL (ref 8–27)
Bilirubin Total: 0.4 mg/dL (ref 0.0–1.2)
CO2: 26 mmol/L (ref 20–29)
CREATININE: 1.24 mg/dL — AB (ref 0.57–1.00)
Calcium: 10.6 mg/dL — ABNORMAL HIGH (ref 8.7–10.3)
Chloride: 94 mmol/L — ABNORMAL LOW (ref 96–106)
GFR calc non Af Amer: 41 mL/min/{1.73_m2} — ABNORMAL LOW (ref >59)
GFR, EST AFRICAN AMERICAN: 47 mL/min/{1.73_m2} — AB (ref >59)
GLOBULIN, TOTAL: 2.4 g/dL (ref 1.5–4.5)
Glucose: 114 mg/dL — ABNORMAL HIGH (ref 65–99)
Potassium: 3.6 mmol/L (ref 3.5–5.2)
SODIUM: 137 mmol/L (ref 134–144)
TOTAL PROTEIN: 7.1 g/dL (ref 6.0–8.5)

## 2017-08-18 LAB — HEMOGLOBIN A1C
Est. average glucose Bld gHb Est-mCnc: 120 mg/dL
Hgb A1c MFr Bld: 5.8 % — ABNORMAL HIGH (ref 4.8–5.6)

## 2017-08-18 LAB — TSH: TSH: 0.585 u[IU]/mL (ref 0.450–4.500)

## 2017-08-24 ENCOUNTER — Telehealth: Payer: Self-pay

## 2017-08-24 NOTE — Telephone Encounter (Signed)
Tried calling patient, no answer. Will try again later.  

## 2017-08-24 NOTE — Telephone Encounter (Signed)
-----  Message from Jerrol Banana., MD sent at 08/23/2017  4:57 PM EDT ----- Push fluids--stop any calcium supplements. RTC 1-2 months--will recheck Met C then. For CKD and hypercalcemia.

## 2017-08-29 NOTE — Telephone Encounter (Signed)
Pt advised. appt made.

## 2017-09-26 ENCOUNTER — Ambulatory Visit: Payer: Self-pay | Admitting: Family Medicine

## 2017-10-03 ENCOUNTER — Other Ambulatory Visit: Payer: Self-pay | Admitting: Family Medicine

## 2017-10-03 ENCOUNTER — Ambulatory Visit (INDEPENDENT_AMBULATORY_CARE_PROVIDER_SITE_OTHER): Payer: PPO | Admitting: Family Medicine

## 2017-10-03 VITALS — BP 142/58 | HR 92 | Temp 97.5°F | Resp 14 | Wt 148.0 lb

## 2017-10-03 DIAGNOSIS — R600 Localized edema: Secondary | ICD-10-CM | POA: Diagnosis not present

## 2017-10-03 DIAGNOSIS — K219 Gastro-esophageal reflux disease without esophagitis: Secondary | ICD-10-CM

## 2017-10-03 DIAGNOSIS — K279 Peptic ulcer, site unspecified, unspecified as acute or chronic, without hemorrhage or perforation: Secondary | ICD-10-CM | POA: Diagnosis not present

## 2017-10-03 DIAGNOSIS — C50412 Malignant neoplasm of upper-outer quadrant of left female breast: Secondary | ICD-10-CM | POA: Diagnosis not present

## 2017-10-03 DIAGNOSIS — I129 Hypertensive chronic kidney disease with stage 1 through stage 4 chronic kidney disease, or unspecified chronic kidney disease: Secondary | ICD-10-CM | POA: Diagnosis not present

## 2017-10-03 DIAGNOSIS — G629 Polyneuropathy, unspecified: Secondary | ICD-10-CM | POA: Diagnosis not present

## 2017-10-03 NOTE — Patient Instructions (Addendum)
Stop taking the following medications Nexium Sucrafate Lasix Metalazone KLOR CON

## 2017-10-03 NOTE — Progress Notes (Signed)
Sandra Fritz  MRN: [id number] DOB: 11/18/1934  Subjective:  HPI   The patient is an 83 year old female who presents for follow up of chronic health.  She was last seen in May.  At that time she had labs done and those were to be reviewed with patient today. The patient complains that she is losing weight and would like to come off of some of her medicine.  She states she is losing the weight because she is unable to eat with all the pills that are in her stomach. Overall she is feeling better,. Especially emotionally. Edema resolved.  Patient Active Problem List   Diagnosis Date Noted  . Loss of weight 12/06/2016  . Bilateral carotid artery stenosis 10/18/2016  . Primary cancer of upper outer quadrant of left female breast ([place]) 07/28/2016  . Left breast mass 07/25/2016  . Abnormal finding on mammography 07/25/2016  . Hyperglycemia 04/12/2016  . Absolute anemia 07/25/2014  . Anxiety 07/25/2014  . AB (asthmatic bronchitis) 07/25/2014  . 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 Intervertebral cervical disc disorder with myelopathy, cervical region 07/25/2014  . Chest pain 07/25/2014  . Breath shortness 07/25/2014  . Edema leg 07/25/2014  . Fatigue 07/25/2014  . Acid reflux 07/25/2014  . 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 Arthralgia of hip 07/25/2014  . HLD (hyperlipidemia) 07/25/2014  . Below normal amount of sodium in the blood 07/25/2014  . Allergic state 07/25/2014  . Neuropathy 07/25/2014  . Arthritis, degenerative 07/25/2014  . Peptic ulcer 07/25/2014  . FOOT, PAIN 10/12/2007  . Hyperlipidemia 09/18/2007  . Hypertension 09/18/2007  . PEPTIC ULCER DISEASE 09/18/2007  . PLANTAR FASCIITIS, LEFT 09/18/2007    Past Medical History:  Diagnosis Date  . Anemia   . Anxiety   . Arthritis   . Blood transfusion without reported diagnosis   . Breast cancer ([place])   . Cataract   . Complication of anesthesia    HARD TO WAKE UP AFTER TONSILLECTOMY  . GERD  (gastroesophageal reflux disease)   . Hyperlipidemia   . Hypertension   . Neuromuscular disorder ([place])   . Personal history of chemotherapy   . Primary cancer of upper outer quadrant of left female breast ([place]) 07/28/2016   2.7 cm, T2, ER/PR +, her 2 neu negative prior to neoadjuvant chemotherapy    Social History   Socioeconomic History  . Marital status: Widowed    Spouse name: Not on file  . Number of children: 0  . Years of education: Not on file  . Highest education level: Associate degree: occupational, Hotel manager, or vocational program  Occupational History  . Occupation: retired  Scientific laboratory technician  . Financial resource strain: Not hard at all  . Food insecurity:    Worry: Never true    Inability: Never true  . Transportation needs:    Medical: No    Non-medical: No  Tobacco Use  . Smoking status: Never Smoker  . Smokeless tobacco: Never Used  Substance and Sexual Activity  . Alcohol use: Yes    Alcohol/week: 0.0 oz    Comment: Maybe 3 per year  . Drug use: No  . Sexual activity: Never  Lifestyle  . Physical activity:    Days per week: Not on file    Minutes per session: Not on file  . Stress: Not at all  Relationships  . Social connections:    Talks on phone: Not on file    Gets together: Not on file  Attends religious service: Not on file    Active member of club or organization: Not on file    Attends meetings of clubs or organizations: Not on file    Relationship status: Not on file  . Intimate partner violence:    Fear of current or ex partner: Not on file    Emotionally abused: Not on file    Physically abused: Not on file    Forced sexual activity: Not on file  Other Topics Concern  . Not on file  Social History Narrative  . Not on file    Outpatient Encounter Medications as of 10/03/2017  Medication Sig  . aspirin 81 MG tablet Take 81 mg by mouth daily.   Marland Kitchen esomeprazole (NEXIUM) 20 MG capsule Take 1 capsule (20 mg total) by mouth daily.  .  furosemide (LASIX) 20 MG tablet Take 20 mg by mouth daily.   Marland Kitchen gabapentin (NEURONTIN) 400 MG capsule Take 1 capsule (400 mg total) by mouth at bedtime.  Marland Kitchen KLOR-CON M20 20 MEQ tablet TAKE 1 TABLET BY MOUTH TWICE A DAY (Patient taking differently: TAKE 1 TABLET BY MOUTH A DAY)  . letrozole (FEMARA) 2.5 MG tablet TAKE 1 TABLET BY MOUTH EVERY DAY  . losartan (COZAAR) 100 MG tablet TAKE 1 TABLET BY MOUTH EVERY DAY  . metolazone (ZAROXOLYN) 2.5 MG tablet TAKE 1 TABLET (2.5 MG TOTAL) DAILY BY MOUTH.  . mirtazapine (REMERON) 7.5 MG tablet Take 15 mg by mouth at bedtime.   . Misc Natural Products (OSTEO BI-FLEX JOINT SHIELD PO) Take 2 tablets by mouth daily.  . Multiple Vitamin (MULTIVITAMIN WITH MINERALS) TABS tablet Take 1 tablet by mouth daily.   . prochlorperazine (COMPAZINE) 10 MG tablet Take 10 mg by mouth every 6 (six) hours as needed for nausea or vomiting.  . rosuvastatin (CRESTOR) 5 MG tablet TAKE 1 TABLET (5 MG TOTAL) BY MOUTH EVERY OTHER DAY.  . sucralfate (CARAFATE) 1 g tablet TAKE 1 TABLET (1 G TOTAL) BY MOUTH 4 (FOUR) TIMES DAILY - WITH MEALS AND AT BEDTIME.  . [DISCONTINUED] metolazone (ZAROXOLYN) 2.5 MG tablet   . [DISCONTINUED] lidocaine-prilocaine (EMLA) cream Apply 1 application topically as needed. Apply to port 1 hour prior to chemotherapy appointment. Cover with plastic wrap. (Patient not taking: Reported on 07/25/2017)   No facility-administered encounter medications on file as of 10/03/2017.     Allergies  Allergen Reactions  . Erythromycin Rash and Other (See Comments)    ALL MYCINS  . Keflex [Cephalexin] Hives    Patient does not remember  . Statins Other (See Comments)    Muscle and joint pain-patient states she tried all statins.    Review of Systems  Constitutional: Positive for malaise/fatigue and weight loss. Negative for fever.  HENT: Negative.   Eyes: Negative.   Respiratory: Negative for cough, shortness of breath and wheezing.   Cardiovascular: Negative for  chest pain, palpitations, orthopnea, claudication and leg swelling.  Gastrointestinal: Negative.   Genitourinary: Negative.   Skin: Negative.   Neurological: Positive for tingling.  Endo/Heme/Allergies: Negative.   Psychiatric/Behavioral: Negative.     Objective:  BP (!) 142/58 (BP Location: Right Arm, Patient Position: Sitting, Cuff Size: Normal)   Pulse 92   Temp (!) 97.5 F (36.4 C) (Oral)   Resp 14   Wt 148 lb (67.1 kg)   BMI 21.86 kg/m   Physical Exam  Constitutional: She is oriented to person, place, and time and well-developed, well-nourished, and in no distress.  HENT:  Head:  Normocephalic and atraumatic.  Right Ear: External ear normal.  Left Ear: External ear normal.  Nose: Nose normal.  Eyes: Conjunctivae are normal.  Neck: No thyromegaly present.  Cardiovascular: Normal rate, regular rhythm and normal heart sounds.  Pulmonary/Chest: Effort normal and breath sounds normal.  Abdominal: Soft.  Musculoskeletal: She exhibits no edema.  Neurological: She is alert and oriented to person, place, and time. Gait normal. GCS score is 15.  Skin: Skin is warm and dry.  Psychiatric: Mood, memory, affect and judgment normal.    Assessment and Plan :  GERD Stop nexium and sucralfate for now. Edema Resolved--to protect kidneys will stop lasix,metolazone,kcl. CKD Breast Cancer Neuropathy Weight Loss F/u 6 weeks.  I have done the exam and reviewed the chart and it is accurate to the best of my knowledge. Development worker, community has been used and  any errors in dictation or transcription are unintentional. Miguel Aschoff M.D. [facility]

## 2017-10-07 DIAGNOSIS — I129 Hypertensive chronic kidney disease with stage 1 through stage 4 chronic kidney disease, or unspecified chronic kidney disease: Secondary | ICD-10-CM | POA: Insufficient documentation

## 2017-11-07 DIAGNOSIS — I10 Essential (primary) hypertension: Secondary | ICD-10-CM | POA: Diagnosis not present

## 2017-11-07 DIAGNOSIS — R42 Dizziness and giddiness: Secondary | ICD-10-CM | POA: Diagnosis not present

## 2017-11-07 DIAGNOSIS — I6523 Occlusion and stenosis of bilateral carotid arteries: Secondary | ICD-10-CM | POA: Diagnosis not present

## 2017-11-07 DIAGNOSIS — E782 Mixed hyperlipidemia: Secondary | ICD-10-CM | POA: Diagnosis not present

## 2017-11-12 NOTE — Progress Notes (Deleted)
[place]  Telephone:([phone]) [phone] Fax:(336) [phone]  ID: Sandra Fritz OB: Sep 06, 1934  MR#: [id number]  DGL#:[id number]  Patient Care Team: Jerrol Banana., MD as PCP - General (Unknown Physician Specialty) Bary Castilla, Forest Gleason, MD (General Surgery) Lloyd Huger, MD as Consulting Physician (Oncology) Rico Junker, RN as Oncology Nurse Navigator Vladimir Crofts, MD as Consulting Physician (Neurology)  CHIEF COMPLAINT: Clinical stage Ib ER/PR positive, HER-2 negative invasive carcinoma of the upper outer quadrant of the left breast.  INTERVAL HISTORY: Patient returns to clinic today for repeat laboratory work and further evaluation.  She is tolerating letrozole well without significant side effects.  She does not complain of weakness or fatigue today.  She continues to have insomnia secondary to peripheral neuropathy, but has not yet started gabapentin. She has no other neurologic complaints. She denies any recent fevers or illnesses. She has no chest pain or shortness of breath. She denies any nausea, vomiting, constipation, or diarrhea. She has no urinary complaints. Patient offers no specific complaints today.  REVIEW OF SYSTEMS:   Review of Systems  Constitutional: Negative.  Negative for fever, malaise/fatigue and weight loss.  HENT: Negative for sore throat.   Eyes: Negative for blurred vision, double vision and pain.  Respiratory: Negative.  Negative for cough and shortness of breath.   Cardiovascular: Positive for leg swelling. Negative for chest pain.  Gastrointestinal: Negative.  Negative for abdominal pain.  Genitourinary: Negative.   Musculoskeletal: Negative.   Skin: Negative.  Negative for rash.  Neurological: Positive for tingling and sensory change. Negative for weakness and headaches.  Psychiatric/Behavioral: The patient has insomnia. The patient is not nervous/anxious.     As per HPI. Otherwise, a complete review of systems is  negative.  PAST MEDICAL HISTORY: Past Medical History:  Diagnosis Date  . Anemia   . Anxiety   . Arthritis   . Blood transfusion without reported diagnosis   . Breast cancer ([place])   . Cataract   . Complication of anesthesia    HARD TO WAKE UP AFTER TONSILLECTOMY  . GERD (gastroesophageal reflux disease)   . Hyperlipidemia   . Hypertension   . Neuromuscular disorder ([place])   . Personal history of chemotherapy   . Primary cancer of upper outer quadrant of left female breast ([place]) 07/28/2016   2.7 cm, T2, ER/PR +, her 2 neu negative prior to neoadjuvant chemotherapy    PAST SURGICAL HISTORY: Past Surgical History:  Procedure Laterality Date  . APPENDECTOMY  1955   SECONDARY TO RUTURED APPENDIX  . BREAST BIOPSY Right 08/01/2017   stereo x clip path pending  . CATARACT EXTRACTION, BILATERAL  2011  . COLONOSCOPY  2002  . DILATION AND CURETTAGE OF UTERUS  1989   Dr. Laurey Morale  . JOINT REPLACEMENT  2012   Right hip  . MASTECTOMY Left 12/30/2016  . MASTECTOMY W/ SENTINEL NODE BIOPSY Left 12/30/2016   Procedure: LEFT MASTECTOMY WITH SENTINEL LYMPH NODE BIOPSY;  Surgeon: Robert Bellow, MD;  Location: [facility] ORS;  Service: General;  Laterality: Left;  . MOLE REMOVAL  1957   18 removed by Dr. Hoyle Sauer  . NECK SURGERY  2008   metal in neck  . PORTACATH PLACEMENT Right 08/05/2016   Procedure: INSERTION PORT-A-CATH WITH ULTRASOUND;  Surgeon: Robert Bellow, MD;  Location: [facility] ORS;  Service: General;  Laterality: Right;  . TONSILLECTOMY  1955    FAMILY HISTORY: Family History  Problem Relation Age of Onset  . Macular degeneration  Mother   . Hypertension Mother   . Diabetes Father   . Heart disease Father   . Heart attack Father 35  . Heart disease Brother 71  . Dermatomyositis Sister   . Cancer Sister        Carcinoma of the uterine or ovarian all attached to colon and is in remission now  . Breast cancer Maternal Aunt 70  . Breast cancer Paternal Aunt 53  . Breast  cancer Cousin        paternal aunt w/ breast ca daughter  . Breast cancer Maternal Aunt 60    ADVANCED DIRECTIVES (Y/N):  N  HEALTH MAINTENANCE: Social History   Tobacco Use  . Smoking status: Never Smoker  . Smokeless tobacco: Never Used  Substance Use Topics  . Alcohol use: Yes    Alcohol/week: 0.0 standard drinks    Comment: Maybe 3 per year  . Drug use: No     Colonoscopy:  PAP:  Bone density:  Lipid panel:  Allergies  Allergen Reactions  . Erythromycin Rash and Other (See Comments)    ALL MYCINS  . Keflex [Cephalexin] Hives    Patient does not remember  . Statins Other (See Comments)    Muscle and joint pain-patient states she tried all statins.    Current Outpatient Medications  Medication Sig Dispense Refill  . aspirin 81 MG tablet Take 81 mg by mouth daily.     Marland Kitchen esomeprazole (NEXIUM) 20 MG capsule Take 1 capsule (20 mg total) by mouth daily. 90 capsule 3  . furosemide (LASIX) 20 MG tablet Take 20 mg by mouth daily.     Marland Kitchen gabapentin (NEURONTIN) 400 MG capsule Take 1 capsule (400 mg total) by mouth at bedtime. 90 capsule 3  . KLOR-CON M20 20 MEQ tablet TAKE 1 TABLET BY MOUTH TWICE A DAY (Patient taking differently: TAKE 1 TABLET BY MOUTH A DAY) 60 tablet 0  . letrozole (FEMARA) 2.5 MG tablet TAKE 1 TABLET BY MOUTH EVERY DAY 90 tablet 3  . losartan (COZAAR) 100 MG tablet TAKE 1 TABLET BY MOUTH EVERY DAY 90 tablet 3  . metolazone (ZAROXOLYN) 2.5 MG tablet TAKE 1 TABLET (2.5 MG TOTAL) DAILY BY MOUTH. 30 tablet 5  . mirtazapine (REMERON) 7.5 MG tablet Take 15 mg by mouth at bedtime.     . Misc Natural Products (OSTEO BI-FLEX JOINT SHIELD PO) Take 2 tablets by mouth daily.    . Multiple Vitamin (MULTIVITAMIN WITH MINERALS) TABS tablet Take 1 tablet by mouth daily.     . prochlorperazine (COMPAZINE) 10 MG tablet Take 10 mg by mouth every 6 (six) hours as needed for nausea or vomiting.    . rosuvastatin (CRESTOR) 5 MG tablet TAKE 1 TABLET (5 MG TOTAL) BY MOUTH EVERY  OTHER DAY. 45 tablet 3  . sucralfate (CARAFATE) 1 g tablet TAKE 1 TABLET (1 G TOTAL) BY MOUTH 4 (FOUR) TIMES DAILY - WITH MEALS AND AT BEDTIME. 120 tablet 4   No current facility-administered medications for this visit.     OBJECTIVE: There were no vitals filed for this visit.   There is no height or weight on file to calculate BMI.    ECOG FS:1 - Symptomatic but completely ambulatory  General: Well-developed, well-nourished, no acute distress. Eyes: Pink conjunctiva, anicteric sclera.   Breasts: Well-healing scar on left breast. HEENT: Clear oropharynx. Lungs: Clear to auscultation bilaterally. Heart: Regular rate and rhythm. No rubs, murmurs, or gallops. Abdomen: Soft, nontender, nondistended. No organomegaly noted, normoactive  bowel sounds. Musculoskeletal: 1-2+ bilateral edema. Neuro: Alert, answering all questions appropriately. Cranial nerves grossly intact. Skin: No rashes or petechiae noted. Psych: Normal affect.   LAB RESULTS:  Lab Results  Component Value Date   NA 137 08/17/2017   K 3.6 08/17/2017   CL 94 (L) 08/17/2017   CO2 26 08/17/2017   GLUCOSE 114 (H) 08/17/2017   BUN 25 08/17/2017   CREATININE 1.24 (H) 08/17/2017   CALCIUM 10.6 (H) 08/17/2017   PROT 7.1 08/17/2017   ALBUMIN 4.7 08/17/2017   AST 23 08/17/2017   ALT 17 08/17/2017   ALKPHOS 74 08/17/2017   BILITOT 0.4 08/17/2017   GFRNONAA 41 (L) 08/17/2017   GFRAA 47 (L) 08/17/2017    Lab Results  Component Value Date   WBC 4.4 08/17/2017   NEUTROABS 2.9 08/17/2017   HGB 11.5 08/17/2017   HCT 34.2 08/17/2017   MCV 83 08/17/2017   PLT 289 08/17/2017     STUDIES: No results found.  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  PLAN:    1. Clinical stage Ib ER/PR positive, HER-2 negative invasive carcinoma of the upper outer quadrant of the left breast: Patient completed 4 cycles of Adriamycin and Cytoxan on  October 14, 2016. She only received 3 cycles of weekly Taxol, her last one on November 08, 2016. Given her worsening performance status and peripheral neuropathy treatment was discontinued altogether. She had a total mastectomy on December 30, 2016 revealing residual disease. She did not require adjuvant XRT.  Continue letrozole which she will be required to take for a total of 5 years completing in November 2023. Return to clinic in 6 months for routine evaluation. 2.  Osteopenia: Patient had a baseline bone marrow density on March 28, 2017 that reported T score of -1.7.  Continue calcium and vitamin D supplementation.  Repeat in 1 year 3.  Peripheral neuropathy: Patient has been instructed to initiate her gabapentin as prescribed.  Continue follow-up and evaluation per primary care. 4. Hypokalemia: Mild.  Continue oral potassium supplementation.  5. Anemia: Mild, monitor. 6. Renal insufficiency: Resolved. 7. Peripheral neuropathy: Continue follow-up with neurology as needed. 8.  Genetic testing: Negative.   Patient expressed understanding and was in agreement with this plan. She also understands that She can call clinic at any time with any questions, concerns, or complaints.   Cancer Staging Primary cancer of upper outer quadrant of left female breast [facility]) Staging form: Breast, AJCC 8th Edition - Clinical stage from 07/28/2016: Stage IB (cT2, cN0, cM0, G2, ER: Positive, PR: Positive, HER2: Negative) - Signed by Lloyd Huger, MD on 07/28/2016 - Pathologic stage from 01/10/2017: No Stage Recommended (ypT2, pN0, cM0, G1, ER: Positive, PR: Positive, HER2: Negative) - Signed by Lloyd Huger, MD on 01/10/2017   Lloyd Huger, MD   11/12/2017 11:14 AM

## 2017-11-15 ENCOUNTER — Inpatient Hospital Stay: Payer: PPO | Admitting: Oncology

## 2017-11-15 ENCOUNTER — Inpatient Hospital Stay: Payer: PPO

## 2017-11-16 ENCOUNTER — Other Ambulatory Visit: Payer: Self-pay | Admitting: Family Medicine

## 2017-11-16 ENCOUNTER — Other Ambulatory Visit: Payer: Self-pay | Admitting: Oncology

## 2017-12-04 ENCOUNTER — Ambulatory Visit: Payer: Self-pay | Admitting: Family Medicine

## 2017-12-12 ENCOUNTER — Ambulatory Visit (INDEPENDENT_AMBULATORY_CARE_PROVIDER_SITE_OTHER): Payer: PPO | Admitting: Family Medicine

## 2017-12-12 ENCOUNTER — Other Ambulatory Visit: Payer: Self-pay | Admitting: Family Medicine

## 2017-12-12 ENCOUNTER — Encounter: Payer: Self-pay | Admitting: Family Medicine

## 2017-12-12 VITALS — BP 130/58 | HR 98 | Temp 98.2°F | Resp 16 | Ht 69.0 in | Wt 157.0 lb

## 2017-12-12 DIAGNOSIS — C50412 Malignant neoplasm of upper-outer quadrant of left female breast: Secondary | ICD-10-CM

## 2017-12-12 DIAGNOSIS — Z23 Encounter for immunization: Secondary | ICD-10-CM | POA: Diagnosis not present

## 2017-12-12 DIAGNOSIS — E559 Vitamin D deficiency, unspecified: Secondary | ICD-10-CM

## 2017-12-12 DIAGNOSIS — N189 Chronic kidney disease, unspecified: Secondary | ICD-10-CM | POA: Diagnosis not present

## 2017-12-12 DIAGNOSIS — D649 Anemia, unspecified: Secondary | ICD-10-CM

## 2017-12-12 NOTE — Progress Notes (Signed)
Patient: Sandra Fritz Female    DOB: [birth year]   83 y.o.   MRN: [id number] Visit Date: 12/12/2017  Today's Provider: Wilhemena Durie, MD   Chief Complaint  Patient presents with  . Hypertension   Subjective:    HPI  Hypertension, follow-up:  BP Readings from Last 3 Encounters:  12/12/17 (!) 130/58  10/03/17 (!) 142/58  08/17/17 126/60    She was last seen for hypertension 4 months ago.  BP at that visit was 126/60. Management since that visit includes no changes. She reports good compliance with treatment. She is not having side effects.  She is exercising. She is adherent to low salt diet.   Outside blood pressures are checked occasionally. She is experiencing lower extremity edema.  Patient denies chest pressure/discomfort and fatigue.   Cardiovascular risk factors include dyslipidemia.   Weight trend: stable Wt Readings from Last 3 Encounters:  12/12/17 157 lb (71.2 kg)  10/03/17 148 lb (67.1 kg)  08/17/17 147 lb (66.7 kg)    Current diet: well balanced    Allergies  Allergen Reactions  . Erythromycin Rash and Other (See Comments)    ALL MYCINS  . Keflex [Cephalexin] Hives    Patient does not remember  . Statins Other (See Comments)    Muscle and joint pain-patient states she tried all statins.     Current Outpatient Medications:  .  aspirin 81 MG tablet, Take 81 mg by mouth daily. , Disp: , Rfl:  .  esomeprazole (NEXIUM) 20 MG capsule, Take 1 capsule (20 mg total) by mouth daily., Disp: 90 capsule, Rfl: 3 .  fluticasone (FLONASE) 50 MCG/ACT nasal spray, PLACE 1 SPRAY INTO BOTH NOSTRILS DAILY., Disp: 16 g, Rfl: 11 .  furosemide (LASIX) 20 MG tablet, Take 20 mg by mouth daily. , Disp: , Rfl:  .  gabapentin (NEURONTIN) 400 MG capsule, Take 1 capsule (400 mg total) by mouth at bedtime., Disp: 90 capsule, Rfl: 3 .  letrozole (FEMARA) 2.5 MG tablet, TAKE 1 TABLET BY MOUTH EVERY DAY, Disp: 90 tablet, Rfl: 3 .  losartan (COZAAR) 100 MG  tablet, TAKE 1 TABLET BY MOUTH EVERY DAY, Disp: 90 tablet, Rfl: 3 .  metolazone (ZAROXOLYN) 2.5 MG tablet, TAKE 1 TABLET (2.5 MG TOTAL) DAILY BY MOUTH., Disp: 30 tablet, Rfl: 5 .  mirtazapine (REMERON) 7.5 MG tablet, Take 15 mg by mouth at bedtime. , Disp: , Rfl:  .  Misc Natural Products (OSTEO BI-FLEX JOINT SHIELD PO), Take 2 tablets by mouth daily., Disp: , Rfl:  .  Multiple Vitamin (MULTIVITAMIN WITH MINERALS) TABS tablet, Take 1 tablet by mouth daily. , Disp: , Rfl:  .  prochlorperazine (COMPAZINE) 10 MG tablet, Take 10 mg by mouth every 6 (six) hours as needed for nausea or vomiting., Disp: , Rfl:  .  rosuvastatin (CRESTOR) 5 MG tablet, TAKE 1 TABLET (5 MG TOTAL) BY MOUTH EVERY OTHER DAY., Disp: 45 tablet, Rfl: 3 .  sucralfate (CARAFATE) 1 g tablet, TAKE 1 TABLET (1 G TOTAL) BY MOUTH 4 (FOUR) TIMES DAILY - WITH MEALS AND AT BEDTIME., Disp: 120 tablet, Rfl: 4 .  KLOR-CON M20 20 MEQ tablet, TAKE 1 TABLET BY MOUTH TWICE A DAY (Patient not taking: Reported on 12/12/2017), Disp: 60 tablet, Rfl: 0  Review of Systems  Constitutional: Negative for activity change, appetite change, chills, diaphoresis, fatigue, fever and unexpected weight change.  Respiratory: Negative for cough and shortness of breath.   Cardiovascular: Positive for leg  swelling. Negative for chest pain and palpitations.  Endocrine: Negative for cold intolerance, heat intolerance, polydipsia and polyphagia.  Musculoskeletal: Positive for arthralgias and back pain.  Neurological: Negative for dizziness, light-headedness and headaches.  Psychiatric/Behavioral: Negative.     Social History   Tobacco Use  . Smoking status: Never Smoker  . Smokeless tobacco: Never Used  Substance Use Topics  . Alcohol use: Yes    Alcohol/week: 0.0 standard drinks    Comment: Maybe 3 per year   Objective:   BP (!) 130/58 (BP Location: Right Arm, Patient Position: Sitting, Cuff Size: Normal)   Pulse 98   Temp 98.2 F (36.8 C)   Resp 16   Ht  5\' 9"  (1.753 m)   Wt 157 lb (71.2 kg)   SpO2 97%   BMI 23.18 kg/m  Vitals:   12/12/17 1443  BP: (!) 130/58  Pulse: 98  Resp: 16  Temp: 98.2 F (36.8 C)  SpO2: 97%  Weight: 157 lb (71.2 kg)  Height: 5\' 9"  (1.753 m)     Physical Exam  Constitutional: She appears well-developed and well-nourished.  HENT:  Head: Normocephalic and atraumatic.  Right Ear: External ear normal.  Left Ear: External ear normal.  Nose: Nose normal.  Eyes: Conjunctivae are normal. No scleral icterus.  Neck: No thyromegaly present.  Cardiovascular: Normal rate, regular rhythm and normal heart sounds.  Pulmonary/Chest: Effort normal and breath sounds normal.  Abdominal: Soft.  Musculoskeletal: She exhibits no edema.  Skin: Skin is warm and dry.  Psychiatric: She has a normal mood and affect. Her behavior is normal. Judgment and thought content normal.        Assessment & Plan:     1. Chronic kidney disease, unspecified CKD stage  - Comprehensive metabolic panel  2. Vitamin D deficiency  - VITAMIN D 25 Hydroxy (Vit-D Deficiency, Fractures)  3. Anemia, unspecified type  - CBC with Differential/Platelet 4.Breast Cancer     I have done the exam and reviewed the above chart and it is accurate to the best of my knowledge. Development worker, community has been used in this note in any air is in the dictation or transcription are unintentional.  Wilhemena Durie, MD  [place]

## 2017-12-13 LAB — COMPREHENSIVE METABOLIC PANEL
ALBUMIN: 4.1 g/dL (ref 3.5–4.7)
ALK PHOS: 78 IU/L (ref 39–117)
ALT: 18 IU/L (ref 0–32)
AST: 24 IU/L (ref 0–40)
Albumin/Globulin Ratio: 2 (ref 1.2–2.2)
BILIRUBIN TOTAL: 0.2 mg/dL (ref 0.0–1.2)
BUN / CREAT RATIO: 15 (ref 12–28)
BUN: 19 mg/dL (ref 8–27)
CO2: 25 mmol/L (ref 20–29)
CREATININE: 1.23 mg/dL — AB (ref 0.57–1.00)
Calcium: 9.8 mg/dL (ref 8.7–10.3)
Chloride: 105 mmol/L (ref 96–106)
GFR calc Af Amer: 48 mL/min/{1.73_m2} — ABNORMAL LOW (ref >59)
GFR calc non Af Amer: 42 mL/min/{1.73_m2} — ABNORMAL LOW (ref >59)
GLUCOSE: 94 mg/dL (ref 65–99)
Globulin, Total: 2.1 g/dL (ref 1.5–4.5)
Potassium: 3.9 mmol/L (ref 3.5–5.2)
Sodium: 144 mmol/L (ref 134–144)
Total Protein: 6.2 g/dL (ref 6.0–8.5)

## 2017-12-13 LAB — VITAMIN D 25 HYDROXY (VIT D DEFICIENCY, FRACTURES): VIT D 25 HYDROXY: 37.4 ng/mL (ref 30.0–100.0)

## 2017-12-13 LAB — CBC WITH DIFFERENTIAL/PLATELET
Basophils Absolute: 0 10*3/uL (ref 0.0–0.2)
Basos: 1 %
EOS (ABSOLUTE): 0.2 10*3/uL (ref 0.0–0.4)
Eos: 3 %
HEMOGLOBIN: 10.6 g/dL — AB (ref 11.1–15.9)
Hematocrit: 31.8 % — ABNORMAL LOW (ref 34.0–46.6)
Immature Grans (Abs): 0 10*3/uL (ref 0.0–0.1)
Immature Granulocytes: 0 %
LYMPHS ABS: 1.2 10*3/uL (ref 0.7–3.1)
Lymphs: 19 %
MCH: 28.7 pg (ref 26.6–33.0)
MCHC: 33.3 g/dL (ref 31.5–35.7)
MCV: 86 fL (ref 79–97)
MONOS ABS: 0.5 10*3/uL (ref 0.1–0.9)
Monocytes: 8 %
NEUTROS ABS: 4.2 10*3/uL (ref 1.4–7.0)
Neutrophils: 69 %
PLATELETS: 258 10*3/uL (ref 150–450)
RBC: 3.69 x10E6/uL — AB (ref 3.77–5.28)
RDW: 13.3 % (ref 12.3–15.4)
WBC: 6.1 10*3/uL (ref 3.4–10.8)

## 2017-12-15 ENCOUNTER — Telehealth: Payer: Self-pay

## 2017-12-15 NOTE — Telephone Encounter (Signed)
-----   Message from Jerrol Banana., MD sent at 12/14/2017  5:31 PM EDT ----- Stable.

## 2017-12-15 NOTE — Telephone Encounter (Signed)
Tried calling; pt voice mail is not set up. 12/15/2017.  Thanks,   -Mickel Baas

## 2017-12-18 NOTE — Telephone Encounter (Signed)
Pt advised of lab results stable

## 2017-12-28 NOTE — Addendum Note (Signed)
Addended by: Wilburt Finlay L on: 12/28/2017 10:11 AM   Modules accepted: Orders

## 2018-01-12 DIAGNOSIS — R42 Dizziness and giddiness: Secondary | ICD-10-CM | POA: Diagnosis not present

## 2018-01-12 DIAGNOSIS — G4700 Insomnia, unspecified: Secondary | ICD-10-CM | POA: Diagnosis not present

## 2018-01-12 DIAGNOSIS — Z8639 Personal history of other endocrine, nutritional and metabolic disease: Secondary | ICD-10-CM | POA: Diagnosis not present

## 2018-01-12 DIAGNOSIS — G2581 Restless legs syndrome: Secondary | ICD-10-CM | POA: Diagnosis not present

## 2018-03-12 ENCOUNTER — Other Ambulatory Visit: Payer: Self-pay | Admitting: Family Medicine

## 2018-04-30 DIAGNOSIS — E782 Mixed hyperlipidemia: Secondary | ICD-10-CM | POA: Diagnosis not present

## 2018-04-30 DIAGNOSIS — R000 Tachycardia, unspecified: Secondary | ICD-10-CM | POA: Diagnosis not present

## 2018-04-30 DIAGNOSIS — I10 Essential (primary) hypertension: Secondary | ICD-10-CM | POA: Diagnosis not present

## 2018-04-30 DIAGNOSIS — I6523 Occlusion and stenosis of bilateral carotid arteries: Secondary | ICD-10-CM | POA: Diagnosis not present

## 2018-04-30 DIAGNOSIS — R5383 Other fatigue: Secondary | ICD-10-CM | POA: Diagnosis not present

## 2018-05-15 DIAGNOSIS — R5383 Other fatigue: Secondary | ICD-10-CM | POA: Diagnosis not present

## 2018-05-15 DIAGNOSIS — I6523 Occlusion and stenosis of bilateral carotid arteries: Secondary | ICD-10-CM | POA: Diagnosis not present

## 2018-05-15 DIAGNOSIS — R000 Tachycardia, unspecified: Secondary | ICD-10-CM | POA: Diagnosis not present

## 2018-05-21 ENCOUNTER — Telehealth: Payer: Self-pay

## 2018-05-21 NOTE — Telephone Encounter (Signed)
Received a message from the phone team stating pt called in to cancel her 06/29/18 AWV. Pt does want to r/s. Tried to contact pt but she did not answer and there was no VM set up. Will try again later.  -MM

## 2018-05-27 ENCOUNTER — Telehealth: Payer: Self-pay | Admitting: Family Medicine

## 2018-05-27 DIAGNOSIS — R404 Transient alteration of awareness: Secondary | ICD-10-CM | POA: Diagnosis not present

## 2018-05-28 DIAGNOSIS — R404 Transient alteration of awareness: Secondary | ICD-10-CM | POA: Diagnosis not present

## 2018-06-01 NOTE — Telephone Encounter (Signed)
Pt passed away. Closing TE -MM

## 2018-06-20 DIAGNOSIS — 419620001 Death: Secondary | SNOMED CT | POA: Diagnosis not present

## 2018-06-20 NOTE — Telephone Encounter (Signed)
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

## 2018-06-20 DEATH — deceased

## 2018-06-29 ENCOUNTER — Ambulatory Visit: Payer: Self-pay

## 2018-07-09 ENCOUNTER — Ambulatory Visit: Payer: Self-pay | Admitting: Family Medicine

## 2018-07-19 ENCOUNTER — Ambulatory Visit: Payer: Self-pay | Admitting: Family Medicine

## 2018-07-25 ENCOUNTER — Other Ambulatory Visit: Payer: Self-pay | Admitting: Oncology

## 2018-12-08 IMAGING — CT CT CERVICAL SPINE W/O CM
5 of 10 series · 9 of 33 positions shown, 10 images · non-contrast
Comparison: None.

CLINICAL DATA: Patient had a witnessed fall, + LOC, and laceration
over left orbit. Patient complains of head, neck and jaw pain. She
does not remember what happened

EXAM:
CT HEAD WITHOUT CONTRAST
CT MAXILLOFACIAL WITHOUT CONTRAST
CT CERVICAL SPINE WITHOUT CONTRAST
TECHNIQUE: Multidetector CT imaging of the head, cervical spine, and
maxillofacial structures were performed using the standard protocol
without intravenous contrast. Multiplanar CT image reconstructions
of the cervical spine and maxillofacial structures were also
generated.

[Series 8: facial/ orbits 2.0 h30s · axial · 0.36mm/px · z∈[-94,-36]mm · 2 of 87 slices shown]
[im 29/87  bone]
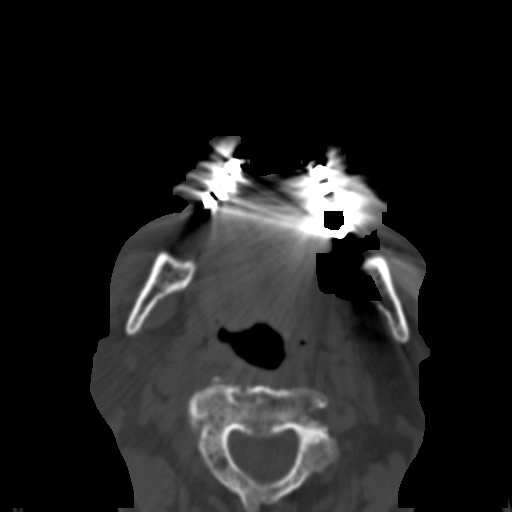
[im 58/87  bone]
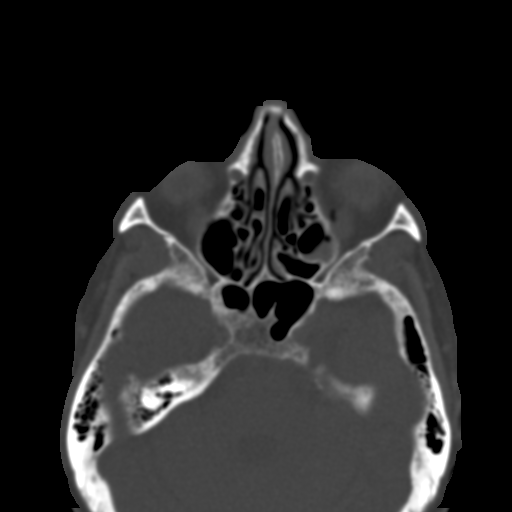

[Series 12: coronal soft tissue · coronal · 0.33mm/px · 1 of 72 slices shown]
[im 36/72  bone]
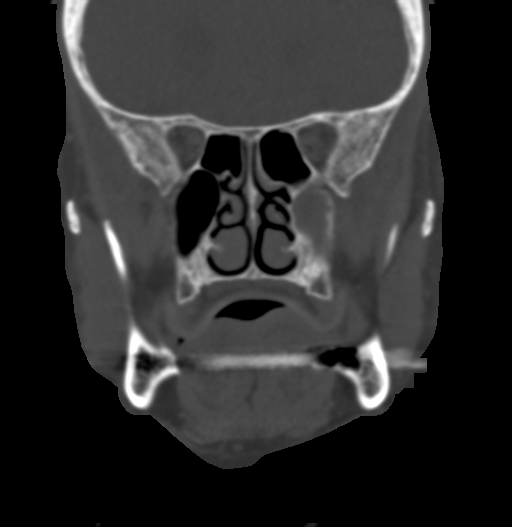

[Series 17: c_spine 2.0 3 st · axial · 0.28mm/px · z∈[-154,-90]mm · 2 of 97 slices shown, 3 images]
[im 33/97  soft-tissue]
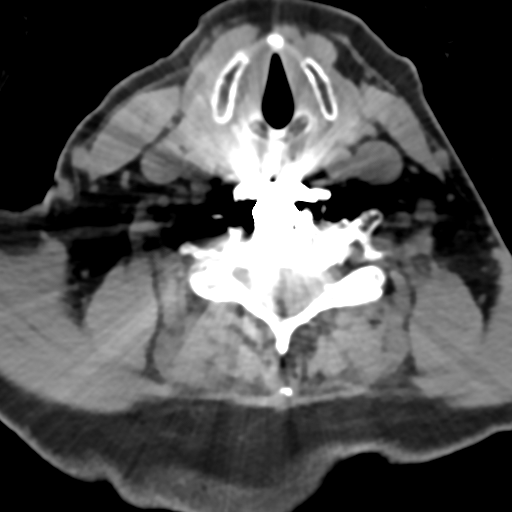
[im 33/97  bone]
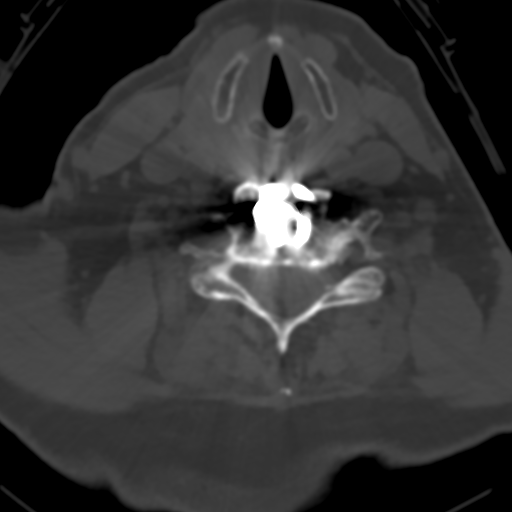
[im 65/97  bone]
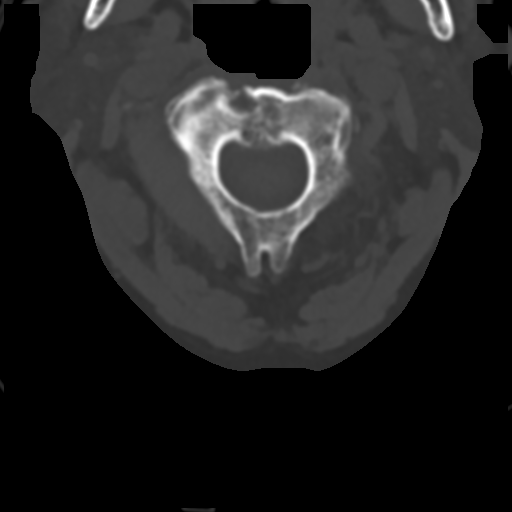

[Series 19: sagittal bone · sagittal · 0.28mm/px · 2 of 50 slices shown]
[im 17/50  bone]
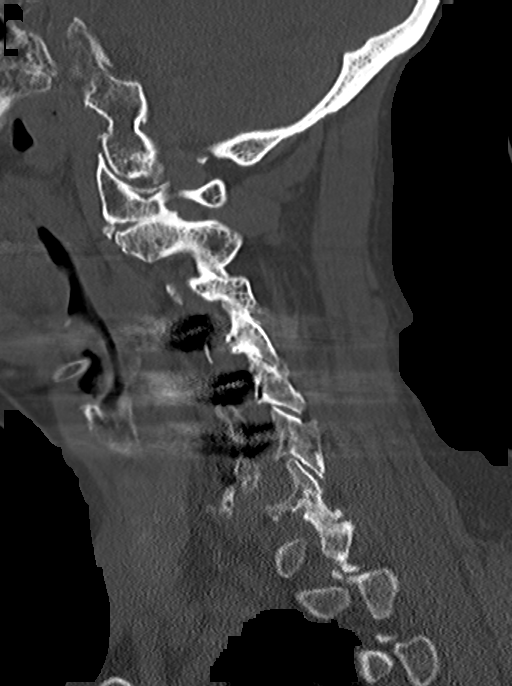
[im 33/50  bone]
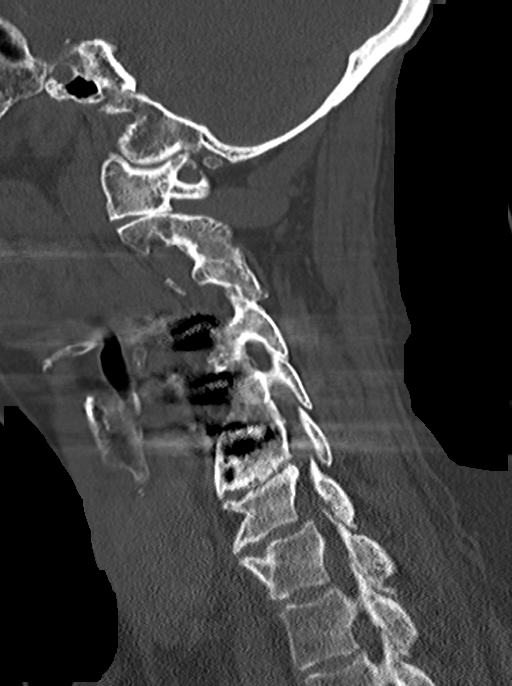

[Series 21: orthogonal axials st · axial · 0.21mm/px · z∈[-180,-126]mm · 2 of 89 slices shown]
[im 30/89  bone]
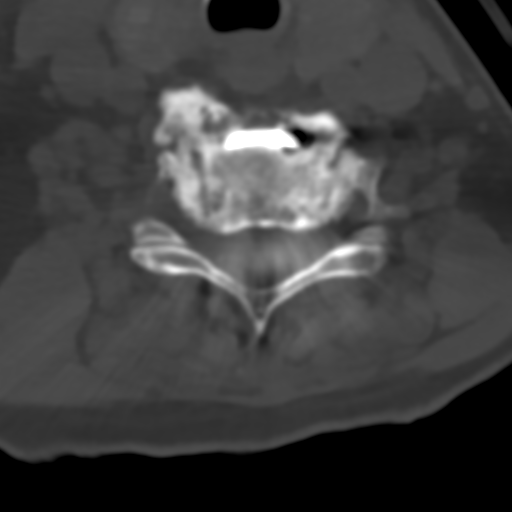
[im 59/89  bone]
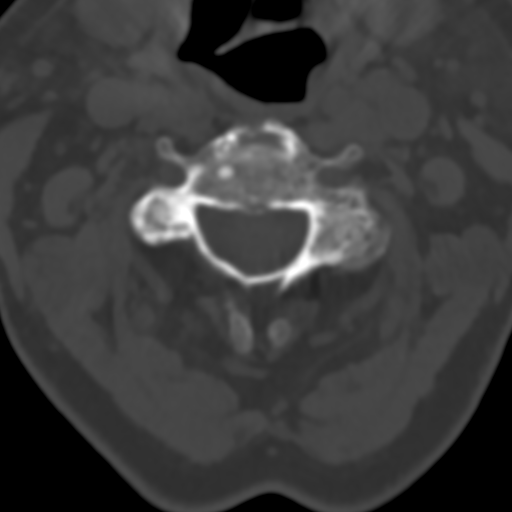

[9 of 33 positions shown; findings below may reference images not displayed]

FINDINGS: CT HEAD FINDINGS

Brain: There is a small focus of hyperattenuation within the sub
cortical white matter of the superior medial left frontal lobe,
measuring 4 mm in size, consistent with a small parenchymal
contusion. No other evidence of intracranial hemorrhage.

Ventricles are normal in configuration. There is ventricular sulcal
enlargement reflecting age related volume loss. No hydrocephalus.

There are no parenchymal masses or mass effect. There is no evidence
of an infarct. Mild patchy white matter hypoattenuation is noted
consistent with chronic microvascular ischemic change.

There are no extra-axial masses or abnormal fluid collections.

Vascular: No hyperdense vessel or unexpected calcification.

Skull: Normal. Negative for fracture or focal lesion.

Other: None.

CT MAXILLOFACIAL FINDINGS

Osseous: There is a fracture of the mid to posterior aspect of the
left orbital floor with no significant depression or displacement.
Small amount of extraconal intraorbital air lies adjacent to the
fracture.

Cell no other fractures.

Orbits: Mild preseptal left periorbital soft tissue swelling/
hemorrhage. Small laceration to the left supraorbital forehead. No
radiopaque foreign body. Unremarkable globes other than changes from
cataract surgery. The postseptal orbits are unremarkable other than
the small amount of extraconal air noted on the left associated with
the orbital floor fracture.

Sinuses: Left maxillary sinuses partly filled with dependent
hemorrhage. Remaining visualized sinuses are clear. Clear mastoid
air cells and middle ear cavities.

Soft tissues: No other soft tissue abnormality. No soft tissue
masses or adenopathy.

CT CERVICAL SPINE FINDINGS

Alignment: Mild reversal the normal cervical lordosis. No
spondylolisthesis.

Skull base and vertebrae: No fracture. No osteoblastic or osteolytic
lesions.

Status post anterior cervical spine fusion from C3 through C6 with
an anterior fusion plate, fixation screws and metal intervertebral
spacers/cages. The orthopedic hardware appears well seated with no
evidence of loosening.

Soft tissues and spinal canal: No prevertebral fluid or swelling. No
visible canal hematoma.

Disc levels: There is moderate loss of disc height with endplate
spurring at the C6-C7 level. Endplate spurring along the mid to
lower cervical spine these 2 at least mild degrees of central
stenosis, which appears chronic. No convincing disc herniation.
Neural foramina are well preserved.

Upper chest: No acute findings. Thyroid is enlarged with multiple
small nodules.

Other: None.
IMPRESSION: HEAD CT

1. Small, 4 mm, focus of parenchymal hemorrhage in the superior
medial left frontal lobe subcortical white matter.
2. No other acute intracranial abnormality.
3. No skull fracture.
MAXILLOFACIAL CT

1. Nondisplaced left orbital floor fracture associated with the left
maxillary sinus hemorrhage. No herniation of the orbital contents or
entrapment of the extra-ocular musculature. No intraorbital hematoma
or inflammation.
2. No other fractures.
3. Left supraorbital forehead laceration and mild left preseptal
periorbital soft tissue swelling.
CERVICAL CT

1. No fracture or acute finding.
2. No evidence of loosening of the cervical spine fusion hardware.
